# Patient Record
Sex: Male | Born: 1945 | Race: White | Hispanic: No | Marital: Single | Smoking: Former smoker
Health system: Southern US, Community
[De-identification: ages and names within clinical notes are randomized; demographics above are authoritative.]

## PROBLEM LIST (undated history)

## (undated) DIAGNOSIS — R7989 Other specified abnormal findings of blood chemistry: Principal | ICD-10-CM

## (undated) DIAGNOSIS — M1990 Unspecified osteoarthritis, unspecified site: Secondary | ICD-10-CM

## (undated) DIAGNOSIS — N400 Enlarged prostate without lower urinary tract symptoms: Secondary | ICD-10-CM

## (undated) DIAGNOSIS — F419 Anxiety disorder, unspecified: Secondary | ICD-10-CM

## (undated) DIAGNOSIS — I10 Essential (primary) hypertension: Secondary | ICD-10-CM

## (undated) DIAGNOSIS — E7800 Pure hypercholesterolemia, unspecified: Secondary | ICD-10-CM

## (undated) HISTORY — PX: COLONOSCOPY: SHX174

## (undated) HISTORY — PX: JOINT REPLACEMENT: SHX530

## (undated) HISTORY — PX: BACK SURGERY: SHX140

## (undated) HISTORY — DX: Other specified abnormal findings of blood chemistry: R79.89

---

## 2001-03-11 ENCOUNTER — Encounter: Payer: Self-pay | Admitting: Family Medicine

## 2001-03-11 ENCOUNTER — Ambulatory Visit (HOSPITAL_COMMUNITY): Admission: RE | Admit: 2001-03-11 | Discharge: 2001-03-11 | Payer: Self-pay | Admitting: Family Medicine

## 2005-09-05 ENCOUNTER — Ambulatory Visit (HOSPITAL_COMMUNITY): Admission: RE | Admit: 2005-09-05 | Discharge: 2005-09-05 | Payer: Self-pay | Admitting: Family Medicine

## 2006-10-01 ENCOUNTER — Ambulatory Visit (HOSPITAL_COMMUNITY): Admission: RE | Admit: 2006-10-01 | Discharge: 2006-10-01 | Payer: Self-pay | Admitting: Family Medicine

## 2007-03-19 ENCOUNTER — Encounter (HOSPITAL_COMMUNITY): Admission: RE | Admit: 2007-03-19 | Discharge: 2007-04-18 | Payer: Self-pay | Admitting: Orthopaedic Surgery

## 2007-04-19 ENCOUNTER — Encounter (HOSPITAL_COMMUNITY): Admission: RE | Admit: 2007-04-19 | Discharge: 2007-04-30 | Payer: Self-pay | Admitting: Orthopaedic Surgery

## 2007-05-02 ENCOUNTER — Encounter (HOSPITAL_COMMUNITY): Admission: RE | Admit: 2007-05-02 | Discharge: 2007-06-01 | Payer: Self-pay | Admitting: Orthopaedic Surgery

## 2007-09-18 ENCOUNTER — Ambulatory Visit (HOSPITAL_COMMUNITY): Admission: RE | Admit: 2007-09-18 | Discharge: 2007-09-18 | Payer: Self-pay | Admitting: General Surgery

## 2008-03-13 ENCOUNTER — Encounter: Admission: RE | Admit: 2008-03-13 | Discharge: 2008-03-13 | Payer: Self-pay | Admitting: Neurosurgery

## 2009-03-04 ENCOUNTER — Inpatient Hospital Stay (HOSPITAL_COMMUNITY): Admission: RE | Admit: 2009-03-04 | Discharge: 2009-03-07 | Payer: Self-pay | Admitting: Orthopaedic Surgery

## 2009-04-13 ENCOUNTER — Encounter (HOSPITAL_COMMUNITY): Admission: RE | Admit: 2009-04-13 | Discharge: 2009-04-29 | Payer: Self-pay | Admitting: Orthopaedic Surgery

## 2010-07-28 ENCOUNTER — Ambulatory Visit (HOSPITAL_COMMUNITY)
Admission: RE | Admit: 2010-07-28 | Discharge: 2010-07-28 | Payer: Self-pay | Source: Home / Self Care | Attending: Family Medicine | Admitting: Family Medicine

## 2010-11-05 LAB — URINE CULTURE: Colony Count: NO GROWTH

## 2010-11-05 LAB — BASIC METABOLIC PANEL
BUN: 10 mg/dL (ref 6–23)
BUN: 12 mg/dL (ref 6–23)
BUN: 14 mg/dL (ref 6–23)
CO2: 27 mEq/L (ref 19–32)
CO2: 29 mEq/L (ref 19–32)
CO2: 29 mEq/L (ref 19–32)
Calcium: 8.3 mg/dL — ABNORMAL LOW (ref 8.4–10.5)
Calcium: 8.4 mg/dL (ref 8.4–10.5)
Chloride: 101 mEq/L (ref 96–112)
Creatinine, Ser: 0.68 mg/dL (ref 0.4–1.5)
Creatinine, Ser: 0.79 mg/dL (ref 0.4–1.5)
GFR calc Af Amer: >60 mL/min (ref >60)
GFR calc non Af Amer: >60 mL/min (ref >60)
Glucose, Bld: 140 mg/dL — ABNORMAL HIGH (ref 70–99)
Glucose, Bld: 140 mg/dL — ABNORMAL HIGH (ref 70–99)
Potassium: 4.3 mEq/L (ref 3.5–5.1)

## 2010-11-05 LAB — PROTIME-INR
INR: 1.1 (ref 0.00–1.49)
INR: 1.2 (ref 0.00–1.49)
Prothrombin Time: 14.2 seconds (ref 11.6–15.2)
Prothrombin Time: 15 seconds (ref 11.6–15.2)

## 2010-11-05 LAB — CBC
HCT: 34.9 % — ABNORMAL LOW (ref 39.0–52.0)
MCHC: 34.7 g/dL (ref 30.0–36.0)
MCHC: 35.1 g/dL (ref 30.0–36.0)
MCHC: 35.1 g/dL (ref 30.0–36.0)
MCV: 94.5 fL (ref 78.0–100.0)
MCV: 94.8 fL (ref 78.0–100.0)
MCV: 95.1 fL (ref 78.0–100.0)
Platelets: 147 10*3/uL — ABNORMAL LOW (ref 150–400)
Platelets: 155 10*3/uL (ref 150–400)
RBC: 3.43 MIL/uL — ABNORMAL LOW (ref 4.22–5.81)
RDW: 13.8 % (ref 11.5–15.5)
RDW: 13.9 % (ref 11.5–15.5)
RDW: 14.2 % (ref 11.5–15.5)

## 2010-11-05 LAB — URINE MICROSCOPIC-ADD ON

## 2010-11-05 LAB — URINALYSIS, ROUTINE W REFLEX MICROSCOPIC
Bilirubin Urine: NEGATIVE
Ketones, ur: NEGATIVE mg/dL
Nitrite: NEGATIVE
Urobilinogen, UA: 1 mg/dL (ref 0.0–1.0)

## 2010-11-06 LAB — URINALYSIS, ROUTINE W REFLEX MICROSCOPIC
Bilirubin Urine: NEGATIVE
Hgb urine dipstick: NEGATIVE
Nitrite: NEGATIVE
Specific Gravity, Urine: 1.025 (ref 1.005–1.030)
Urobilinogen, UA: 0.2 mg/dL (ref 0.0–1.0)
pH: 5 (ref 5.0–8.0)

## 2010-11-06 LAB — COMPREHENSIVE METABOLIC PANEL
ALT: 44 U/L (ref 0–53)
AST: 36 U/L (ref 0–37)
Albumin: 4.2 g/dL (ref 3.5–5.2)
Alkaline Phosphatase: 82 U/L (ref 39–117)
Calcium: 9.8 mg/dL (ref 8.4–10.5)
GFR calc Af Amer: >60 mL/min (ref >60)
Potassium: 4.4 mEq/L (ref 3.5–5.1)
Sodium: 139 mEq/L (ref 135–145)
Total Protein: 6.6 g/dL (ref 6.0–8.3)

## 2010-11-06 LAB — TYPE AND SCREEN
ABO/RH(D): O POS
Antibody Screen: NEGATIVE

## 2010-11-06 LAB — DIFFERENTIAL
Basophils Relative: 1 % (ref 0–1)
Eosinophils Absolute: 0.1 10*3/uL (ref 0.0–0.7)
Lymphs Abs: 1.6 10*3/uL (ref 0.7–4.0)
Monocytes Absolute: 0.7 10*3/uL (ref 0.1–1.0)
Monocytes Relative: 12 % (ref 3–12)
Neutrophils Relative %: 58 % (ref 43–77)

## 2010-11-06 LAB — PROTIME-INR: INR: 1 (ref 0.00–1.49)

## 2010-11-06 LAB — CBC
Hemoglobin: 15.3 g/dL (ref 13.0–17.0)
MCHC: 35 g/dL (ref 30.0–36.0)
Platelets: 188 10*3/uL (ref 150–400)
RDW: 14.2 % (ref 11.5–15.5)

## 2010-12-13 NOTE — H&P (Signed)
NAME:  Jerry Boyer, Jerry Boyer NO.:  [id number]   MEDICAL RECORD NO.:  [id number]          PATIENT TYPE:  AMB   LOCATION:  DAY                           FACILITY:  [facility]   PHYSICIAN:  Dalia Heading, M.D.  DATE OF BIRTH:  07/20/1946   DATE OF ADMISSION:  DATE OF DISCHARGE:  LH                              HISTORY & PHYSICAL   CHIEF COMPLAINT:  Need for screening colonoscopy.   HISTORY OF PRESENT ILLNESS:  The patient is a 65 year old white male who  is referred for endoscopic evaluation.  He needs a colonoscopy for  screening purposes.  No abdominal pain, weight loss, nausea, vomiting,  diarrhea, constipation, melena, or hematochezia have been noted.  He  last had a colonoscopy many years ago in [place].  There is no family  history of colon carcinoma.   PAST MEDICAL HISTORY:  Chronic back pain.   PAST SURGICAL HISTORY:  Back surgery.   MEDICATIONS:  None.   ALLERGIES:  No known drug allergies.   REVIEW OF SYSTEMS:  Noncontributory.   PHYSICAL EXAMINATION:  GENERAL:  The patient is a well-developed, well-  nourished white male in no acute distress.  LUNGS:  Clear to auscultation, with equal breath sounds bilaterally.  HEART:  Reveals a regular rate and rhythm, without S3, S4, or murmurs.  ABDOMEN:  The abdomen is soft, nontender, nondistended.  No  hepatosplenomegaly or masses are noted.  RECTAL:  Deferred to the procedure.   IMPRESSION:  Need for screening colonoscopy.   PLAN:  The patient is scheduled for a colonoscopy on September 18, 2007.  The risks and benefits of the procedure, including bleeding and  perforation, were fully explained to the patient, who gave informed  consent.      Dalia Heading, M.D.  Electronically Signed     MAJ/MEDQ  D:  09/12/2007  T:  09/13/2007  Job:  [id number]   cc:   [facility]  Fax: [phone]   Kirk Ruths, M.D.  Fax: [phone]

## 2010-12-13 NOTE — Op Note (Signed)
NAME:  Jerry Boyer, TARAS NO.:  [id number]   MEDICAL RECORD NO.:  [id number]          PATIENT TYPE:  INP   LOCATION:  5016                         FACILITY:  [facility]   PHYSICIAN:  Claude Manges. Whitfield, M.D.DATE OF BIRTH:  Oct 26, 1945   DATE OF PROCEDURE:  03/04/2009  DATE OF DISCHARGE:                               OPERATIVE REPORT   PREOPERATIVE DIAGNOSIS:  End-stage osteoarthritis, left knee.   POSTOPERATIVE DIAGNOSIS:  End-stage osteoarthritis, left knee.   PROCEDURE:  Left total knee replacement.   SURGEON:  Claude Manges. Cleophas Dunker, MD   ASSISTANT:  Oris Drone. [name], PA-C   ANESTHESIA:  General with supplemental femoral nerve block.   COMPLICATIONS:  None.   COMPONENTS:  DePuy LCS large femoral component, a #5 keeled tibial tray,  10-mm polyethylene bridging bearing, and metal-backed 3-peg patella, all  was secured with polymethyl methacrylate.   DESCRIPTION OF PROCEDURE:  The patient was comfortable on the operating  table and under general orotracheal anesthesia, nursing staff inserted a  Foley catheter.  Urine was clear.  Tourniquet was then applied to the  left lower extremity, left lower extremity had been appropriately  identified, and marked as the operative extremity preoperatively.   The left leg was then prepped with Betadine scrub and then DuraPrep from  the tourniquet to the midfoot.  Sterile draping was performed.   With the extremity still elevated, it was Esmarch exsanguinated with a  proximal tourniquet at 350 mmHg.   A midline longitudinal incision was made, centered at the patella,  extending from superior part of the tibial tubercle.  Via sharp  dissection, incision was carried down to subcutaneous tissue.  First  layer of capsule was incised in the midline and medial parapatellar  incision was then made with the Bovie.  The joint was entered.  There  was a clear yellow joint effusion.  The patella was everted to 180  degrees and knee  flexed to 90 degrees.  There was moderate beefy red  synovitis.  A synovectomy was performed.  There were large osteophytes  along the medial and lateral femoral condyle.  There was complete  absence of articular cartilage in the patella and at least 80% along the  medial femoral condyle and probably 70% along the lateral femoral  condyle.  There were osteophytes along the medial tibial plateau, which  were resected.   I templated a large femoral component #5 tibial tray.   Initial cut was made transversely on the proximal tibia at a 7-degree  posterior angle of declination using the external guide.  Subsequent  cuts were then made on the femur using a 10-mm flexion/extension gap,  which were symmetrical.  MCL and LCL remained intact throughout the  operative procedure.  A distal femoral valgus cut of 4 degrees was  utilized.  The final taper cut was then made.   Lamina spreader was inserted in the medial and lateral compartments.  I  removed medial and lateral menisci as well as ACL and PCL and  osteophytes were removed with an osteotome from the posterior aspect of  medial and  lateral femoral condyle.   Retractor was then placed around the tibia.  We measured a #5 tibial  tray, and this was applied.  A center hole was then made followed by the  keeled cut.  With the trial tibial tray in place, a #10 polyethylene  bridging bearing was applied followed by the large femoral component.  We had excellent range of motion.  We had full extension and flexion to  at least 125 degrees without malrotation of the tibial tray.  There was  no opening with varus or valgus stress.   The patella was then prepared by removing approximately 12 mm of bone  leaving 13 mm of patella thickness, 3 holes were then made with the  patella jig.  The trial patella was applied through full range of  motion, was perfectly stable.   Trial components were removed.  The joint was copiously irrigated with  saline  solution.   The final components were then impacted with polymethyl methacrylate.  We initially applied the tibia followed by the polyethylene component  and the femoral component.  Extraneous methacrylate was removed from its  periphery.  The patella was applied with a patellar clamp.  In similar  fashion, methacrylate was removed from its periphery.  We waited for the  methacrylate to completely mature and when hard, we again inspected the  joint, there was no further loose methacrylate.  Joint was copiously  irrigated with saline solution.  We injected Marcaine with epinephrine  into the deep capsule.  Tourniquet was deflated at about 84 minutes.  Gross bleeders were Bovie coagulated.  We injected FloSeal into the  joint for hemostasis.  Hemovac was not necessary.  We lightly irrigated  the joint with saline solution and then closed the wound.   The deep capsule was closed with interrupted #1 Ethibond.  Superficial  capsule was closed with running 0 Vicryl, subcu with 2-0 Vicryl, skin  closed with skin clips.  Sterile bulky dressing was applied followed by  the patient's compressive stocking.   The patient tolerated the procedure without complications.      Claude Manges. Cleophas Dunker, M.D.  Electronically Signed     PWW/MEDQ  D:  03/04/2009  T:  03/04/2009  Job:  [id number]

## 2010-12-16 NOTE — Discharge Summary (Signed)
NAME:  Jerry Boyer, Jerry Boyer NO.:  [id number]   MEDICAL RECORD NO.:  [id number]          PATIENT TYPE:  INP   LOCATION:  5016                         FACILITY:  [facility]   PHYSICIAN:  Claude Manges. Whitfield, M.D.DATE OF BIRTH:  1945/11/11   DATE OF ADMISSION:  03/04/2009  DATE OF DISCHARGE:  03/07/2009                               DISCHARGE SUMMARY   ADMISSION DIAGNOSIS:  Osteoarthritis of the left knee.   DISCHARGE DIAGNOSES:  1. Osteoarthritis of the left knee.  2. History of hypertension.  3. History of cardiac arrhythmia.  4. Obesity.  5. Hyperosmolality.  6. Acute posthemorrhagic anemia.   PROCEDURE:  Left total knee arthroplasty by Dr. Norlene Campbell and  assisted by Jacqualine Code, PA-C.   HISTORY:  [name] is a very pleasant 65 year old white male with  persistent left knee pain for several years.  He now has constant  moderate-to-severe pain, which is affecting his activities of daily  living and sleep.  He has had corticosteroid and Hyalgan injections,  which have become refractory.  He has failed conservative treatment.  Radiographic end-stage OA of the left knee for left total knee  arthroplasty.   HOSPITAL COURSE:  A 65 year old male admitted March 04, 2009, and after  appropriate laboratory studies were obtained as well as 2 g of Ancef IV  on-call to the operating room, he was taken to the operating room where  he underwent a left total knee arthroplasty.  He tolerated the procedure  well.  He was continued on Ancef 1 g IV q.6 h. for three doses.  Placed  on Dilaudid PCA full-dose pain management system.  Placed on Lovenox 30  mg subcu q.12 h. starting on March 04, 2009, at 10 p.m.  CPM was placed  from 0-60 degrees for 8 hours per day, increasing by 5 degrees per day.  Consults of PT, OT, and care management were made.  Partial  weightbearing, 50% weightbearing.  He ws allowed out of bed to chair the  following day.  He was allowed to keep his Foley  until March 06, 2009.  A urine for C and S was taken prior to discontinuation of his Foley on  the March 06, 2009.  He was weaned off his PCA as well as his O2 keeping  his sats greater than 92%.  Remainder of his hospital course was  uneventful.  He was started on Lovenox 40 mg until his protime became  prophylactic after discharge.  He was discharged on March 07, 2009, to  return back to the office in followup.   EKG was read as sinus bradycardia with first-degree AV block.  Inferior  infarct age undetermined.  Cannot rule out anterior infarct.  Preop  hemoglobin 15.3, hematocrit 43.6%, white count 5800, platelets was  188,000.  Discharge hemoglobin 11.4, hematocrit 32.5%, white count 7500,  platelets was 147,000.  Preop protime 13.1, INR 1.0, PTT 28.  Discharge  protime 15.0, INR 1.2.  Preop sodium 139, potassium 4.4, chloride 107,  CO2 of 23, glucose 111, BUN 16, creatinine 0.76.  He did drop to 131  sodium during hospital course.  Discharge sodium 133, potassium 4.0,  chloride 99, CO2 of 29, glucose 115, BUN 10, creatinine 0.68.  GFR  remained greater than 60 throughout his hospital course.  Total protein  preop 6.6, albumin 4.2, AST 36, ALT 44, ALP 82, total bilirubin 0.6.  Urinalysis on February 26, 2009, was benign.  Urinalysis March 06, 2009,  showed 11-20 white cells, 21-50 red cells, mucus present, rare  epitheliums, and small amount of leukocyte esterase.  Blood type was O+,  antibody screen negative.  Urine culture of March 06, 2009, was no  growth.   DISCHARGE INSTRUCTIONS:  No restrictions on his diet.  Use his walker  50% weightbearing as taught.  CPM 0-70 degrees for 8 hours per day,  increasing by 5-10 degrees per day.  He may shower on Sunday or Monday.  No lifting or driving for 6 weeks.  Keep the incision clean and dry and  change dressing daily with 4x4s and tape.   DISCHARGE MEDICATIONS:  1. Percocet 5/325 one to two tablets every 4 hours as needed for pain.  2.  Robaxin 500 mg 1 tablet every 6 hours as needed for spasms.  3. Coumadin 5 mg as directed by Genevieve Norlander Pharmacist.  4. Lovenox 40 mg daily until INR is greater than 2.0.  5. Colace 100 mg b.i.d.  6. MiraLax as needed for constipation.  7. Coumadin 10 mg on March 07, 2009.   Genevieve Norlander will monitor his INRs starting on March 08, 2009.  He will  follow back up with Dr. Cleophas Dunker on March 19, 2009.  He will need to  call for an appointment at that time.  He was discharged in improved  condition.      [name], P.A.-C.      Claude Manges. Cleophas Dunker, M.D.  Electronically Signed    BDP/MEDQ  D:  03/22/2009  T:  03/23/2009  Job:  [id number]

## 2012-01-26 ENCOUNTER — Other Ambulatory Visit: Payer: Self-pay | Admitting: Orthopaedic Surgery

## 2012-01-26 DIAGNOSIS — M545 Low back pain: Secondary | ICD-10-CM

## 2012-02-03 ENCOUNTER — Other Ambulatory Visit: Payer: Self-pay

## 2012-02-05 ENCOUNTER — Other Ambulatory Visit: Payer: Self-pay | Admitting: Orthopaedic Surgery

## 2012-02-05 DIAGNOSIS — Z139 Encounter for screening, unspecified: Secondary | ICD-10-CM

## 2012-02-07 ENCOUNTER — Ambulatory Visit
Admission: RE | Admit: 2012-02-07 | Discharge: 2012-02-07 | Disposition: A | Discharge status: Home / Self Care | Payer: Medicare Other | Source: Ambulatory Visit | Attending: Orthopaedic Surgery | Admitting: Orthopaedic Surgery

## 2012-02-07 DIAGNOSIS — Z139 Encounter for screening, unspecified: Secondary | ICD-10-CM

## 2012-02-07 DIAGNOSIS — M545 Low back pain: Secondary | ICD-10-CM

## 2012-09-03 ENCOUNTER — Other Ambulatory Visit (HOSPITAL_COMMUNITY): Payer: Self-pay | Admitting: Family Medicine

## 2012-09-03 DIAGNOSIS — Z0000 Encounter for general adult medical examination without abnormal findings: Secondary | ICD-10-CM

## 2012-09-03 DIAGNOSIS — I10 Essential (primary) hypertension: Secondary | ICD-10-CM

## 2012-09-05 ENCOUNTER — Ambulatory Visit (HOSPITAL_COMMUNITY)
Admission: RE | Admit: 2012-09-05 | Discharge: 2012-09-05 | Disposition: A | Discharge status: Home / Self Care | Payer: Medicare Other | Source: Ambulatory Visit | Attending: Family Medicine | Admitting: Family Medicine

## 2012-09-05 DIAGNOSIS — Z0000 Encounter for general adult medical examination without abnormal findings: Secondary | ICD-10-CM | POA: Insufficient documentation

## 2012-09-05 DIAGNOSIS — I10 Essential (primary) hypertension: Secondary | ICD-10-CM | POA: Insufficient documentation

## 2013-10-17 ENCOUNTER — Ambulatory Visit (HOSPITAL_COMMUNITY)
Admission: RE | Admit: 2013-10-17 | Discharge: 2013-10-17 | Disposition: A | Discharge status: Home / Self Care | Payer: Medicare HMO | Source: Ambulatory Visit | Attending: Family Medicine | Admitting: Family Medicine

## 2013-10-17 ENCOUNTER — Other Ambulatory Visit (HOSPITAL_COMMUNITY): Payer: Self-pay | Admitting: Family Medicine

## 2013-10-17 DIAGNOSIS — R918 Other nonspecific abnormal finding of lung field: Secondary | ICD-10-CM | POA: Insufficient documentation

## 2013-10-17 DIAGNOSIS — Z0000 Encounter for general adult medical examination without abnormal findings: Secondary | ICD-10-CM

## 2013-10-17 DIAGNOSIS — Z87891 Personal history of nicotine dependence: Secondary | ICD-10-CM | POA: Insufficient documentation

## 2014-08-17 DIAGNOSIS — Z23 Encounter for immunization: Secondary | ICD-10-CM | POA: Diagnosis not present

## 2014-08-17 DIAGNOSIS — N400 Enlarged prostate without lower urinary tract symptoms: Secondary | ICD-10-CM | POA: Diagnosis not present

## 2014-08-17 DIAGNOSIS — M1611 Unilateral primary osteoarthritis, right hip: Secondary | ICD-10-CM | POA: Diagnosis not present

## 2014-08-17 DIAGNOSIS — M19011 Primary osteoarthritis, right shoulder: Secondary | ICD-10-CM | POA: Diagnosis not present

## 2014-08-17 DIAGNOSIS — Z6838 Body mass index (BMI) 38.0-38.9, adult: Secondary | ICD-10-CM | POA: Diagnosis not present

## 2014-08-28 DIAGNOSIS — M545 Low back pain: Secondary | ICD-10-CM | POA: Diagnosis not present

## 2014-08-28 DIAGNOSIS — M47817 Spondylosis without myelopathy or radiculopathy, lumbosacral region: Secondary | ICD-10-CM | POA: Diagnosis not present

## 2014-08-28 DIAGNOSIS — M5137 Other intervertebral disc degeneration, lumbosacral region: Secondary | ICD-10-CM | POA: Diagnosis not present

## 2014-08-28 DIAGNOSIS — M7551 Bursitis of right shoulder: Secondary | ICD-10-CM | POA: Diagnosis not present

## 2014-09-15 DIAGNOSIS — M5416 Radiculopathy, lumbar region: Secondary | ICD-10-CM | POA: Diagnosis not present

## 2014-09-15 DIAGNOSIS — M4306 Spondylolysis, lumbar region: Secondary | ICD-10-CM | POA: Diagnosis not present

## 2014-09-15 DIAGNOSIS — M4316 Spondylolisthesis, lumbar region: Secondary | ICD-10-CM | POA: Diagnosis not present

## 2014-10-08 DIAGNOSIS — M961 Postlaminectomy syndrome, not elsewhere classified: Secondary | ICD-10-CM | POA: Diagnosis not present

## 2014-10-08 DIAGNOSIS — M5416 Radiculopathy, lumbar region: Secondary | ICD-10-CM | POA: Diagnosis not present

## 2014-10-08 DIAGNOSIS — M4726 Other spondylosis with radiculopathy, lumbar region: Secondary | ICD-10-CM | POA: Diagnosis not present

## 2014-10-20 DIAGNOSIS — E669 Obesity, unspecified: Secondary | ICD-10-CM | POA: Diagnosis not present

## 2014-10-20 DIAGNOSIS — Z6838 Body mass index (BMI) 38.0-38.9, adult: Secondary | ICD-10-CM | POA: Diagnosis not present

## 2014-10-20 DIAGNOSIS — I10 Essential (primary) hypertension: Secondary | ICD-10-CM | POA: Diagnosis not present

## 2014-10-20 DIAGNOSIS — Z0001 Encounter for general adult medical examination with abnormal findings: Secondary | ICD-10-CM | POA: Diagnosis not present

## 2014-10-20 DIAGNOSIS — E782 Mixed hyperlipidemia: Secondary | ICD-10-CM | POA: Diagnosis not present

## 2014-10-22 DIAGNOSIS — E291 Testicular hypofunction: Secondary | ICD-10-CM | POA: Diagnosis not present

## 2014-10-28 ENCOUNTER — Other Ambulatory Visit: Payer: Self-pay | Admitting: Physical Medicine and Rehabilitation

## 2014-10-28 DIAGNOSIS — M545 Low back pain: Secondary | ICD-10-CM

## 2014-11-17 ENCOUNTER — Ambulatory Visit
Admission: RE | Admit: 2014-11-17 | Discharge: 2014-11-17 | Disposition: A | Discharge status: Home / Self Care | Payer: Commercial Managed Care - HMO | Source: Ambulatory Visit | Attending: Physical Medicine and Rehabilitation | Admitting: Physical Medicine and Rehabilitation

## 2014-11-17 DIAGNOSIS — M4316 Spondylolisthesis, lumbar region: Secondary | ICD-10-CM | POA: Diagnosis not present

## 2014-11-17 DIAGNOSIS — M545 Low back pain: Secondary | ICD-10-CM

## 2014-11-17 DIAGNOSIS — M47816 Spondylosis without myelopathy or radiculopathy, lumbar region: Secondary | ICD-10-CM | POA: Diagnosis not present

## 2014-11-17 DIAGNOSIS — M4306 Spondylolysis, lumbar region: Secondary | ICD-10-CM | POA: Diagnosis not present

## 2014-12-01 DIAGNOSIS — E291 Testicular hypofunction: Secondary | ICD-10-CM | POA: Diagnosis not present

## 2014-12-01 DIAGNOSIS — M961 Postlaminectomy syndrome, not elsewhere classified: Secondary | ICD-10-CM | POA: Diagnosis not present

## 2014-12-01 DIAGNOSIS — M4306 Spondylolysis, lumbar region: Secondary | ICD-10-CM | POA: Diagnosis not present

## 2014-12-01 DIAGNOSIS — M5416 Radiculopathy, lumbar region: Secondary | ICD-10-CM | POA: Diagnosis not present

## 2015-01-04 DIAGNOSIS — E291 Testicular hypofunction: Secondary | ICD-10-CM | POA: Diagnosis not present

## 2015-02-09 DIAGNOSIS — E291 Testicular hypofunction: Secondary | ICD-10-CM | POA: Diagnosis not present

## 2015-03-15 DIAGNOSIS — E291 Testicular hypofunction: Secondary | ICD-10-CM | POA: Diagnosis not present

## 2015-04-15 DIAGNOSIS — N400 Enlarged prostate without lower urinary tract symptoms: Secondary | ICD-10-CM | POA: Diagnosis not present

## 2015-04-20 DIAGNOSIS — N400 Enlarged prostate without lower urinary tract symptoms: Secondary | ICD-10-CM | POA: Diagnosis not present

## 2015-05-13 DIAGNOSIS — M1711 Unilateral primary osteoarthritis, right knee: Secondary | ICD-10-CM | POA: Diagnosis not present

## 2015-05-13 DIAGNOSIS — R262 Difficulty in walking, not elsewhere classified: Secondary | ICD-10-CM | POA: Diagnosis not present

## 2015-05-20 DIAGNOSIS — R262 Difficulty in walking, not elsewhere classified: Secondary | ICD-10-CM | POA: Diagnosis not present

## 2015-05-20 DIAGNOSIS — Z96652 Presence of left artificial knee joint: Secondary | ICD-10-CM | POA: Diagnosis not present

## 2015-05-20 DIAGNOSIS — M25561 Pain in right knee: Secondary | ICD-10-CM | POA: Diagnosis not present

## 2015-05-20 DIAGNOSIS — M1711 Unilateral primary osteoarthritis, right knee: Secondary | ICD-10-CM | POA: Diagnosis not present

## 2015-05-24 DIAGNOSIS — N400 Enlarged prostate without lower urinary tract symptoms: Secondary | ICD-10-CM | POA: Diagnosis not present

## 2015-05-26 DIAGNOSIS — M1711 Unilateral primary osteoarthritis, right knee: Secondary | ICD-10-CM | POA: Diagnosis not present

## 2015-05-26 DIAGNOSIS — M25561 Pain in right knee: Secondary | ICD-10-CM | POA: Diagnosis not present

## 2015-06-01 DIAGNOSIS — E782 Mixed hyperlipidemia: Secondary | ICD-10-CM | POA: Diagnosis not present

## 2015-06-01 DIAGNOSIS — Z6838 Body mass index (BMI) 38.0-38.9, adult: Secondary | ICD-10-CM | POA: Diagnosis not present

## 2015-06-01 DIAGNOSIS — Z1389 Encounter for screening for other disorder: Secondary | ICD-10-CM | POA: Diagnosis not present

## 2015-06-07 DIAGNOSIS — M25561 Pain in right knee: Secondary | ICD-10-CM | POA: Diagnosis not present

## 2015-06-07 DIAGNOSIS — M1711 Unilateral primary osteoarthritis, right knee: Secondary | ICD-10-CM | POA: Diagnosis not present

## 2015-06-16 DIAGNOSIS — M25561 Pain in right knee: Secondary | ICD-10-CM | POA: Diagnosis not present

## 2015-06-16 DIAGNOSIS — M1711 Unilateral primary osteoarthritis, right knee: Secondary | ICD-10-CM | POA: Diagnosis not present

## 2015-06-23 DIAGNOSIS — M25561 Pain in right knee: Secondary | ICD-10-CM | POA: Diagnosis not present

## 2015-06-23 DIAGNOSIS — M1711 Unilateral primary osteoarthritis, right knee: Secondary | ICD-10-CM | POA: Diagnosis not present

## 2015-09-22 DIAGNOSIS — M179 Osteoarthritis of knee, unspecified: Secondary | ICD-10-CM | POA: Diagnosis not present

## 2015-09-22 DIAGNOSIS — M1711 Unilateral primary osteoarthritis, right knee: Secondary | ICD-10-CM | POA: Diagnosis not present

## 2015-09-22 DIAGNOSIS — Z96652 Presence of left artificial knee joint: Secondary | ICD-10-CM | POA: Diagnosis not present

## 2015-10-21 DIAGNOSIS — N400 Enlarged prostate without lower urinary tract symptoms: Secondary | ICD-10-CM | POA: Diagnosis not present

## 2015-10-21 DIAGNOSIS — Z1389 Encounter for screening for other disorder: Secondary | ICD-10-CM | POA: Diagnosis not present

## 2015-10-21 DIAGNOSIS — Z0000 Encounter for general adult medical examination without abnormal findings: Secondary | ICD-10-CM | POA: Diagnosis not present

## 2015-10-21 DIAGNOSIS — Z6838 Body mass index (BMI) 38.0-38.9, adult: Secondary | ICD-10-CM | POA: Diagnosis not present

## 2015-11-15 DIAGNOSIS — M5416 Radiculopathy, lumbar region: Secondary | ICD-10-CM | POA: Diagnosis not present

## 2016-04-20 DIAGNOSIS — Z23 Encounter for immunization: Secondary | ICD-10-CM | POA: Diagnosis not present

## 2016-05-16 DIAGNOSIS — H52223 Regular astigmatism, bilateral: Secondary | ICD-10-CM | POA: Diagnosis not present

## 2016-05-16 DIAGNOSIS — H2513 Age-related nuclear cataract, bilateral: Secondary | ICD-10-CM | POA: Diagnosis not present

## 2016-05-16 DIAGNOSIS — H5203 Hypermetropia, bilateral: Secondary | ICD-10-CM | POA: Diagnosis not present

## 2016-10-26 DIAGNOSIS — Z0001 Encounter for general adult medical examination with abnormal findings: Secondary | ICD-10-CM | POA: Diagnosis not present

## 2016-10-26 DIAGNOSIS — Z1389 Encounter for screening for other disorder: Secondary | ICD-10-CM | POA: Diagnosis not present

## 2016-10-26 DIAGNOSIS — N401 Enlarged prostate with lower urinary tract symptoms: Secondary | ICD-10-CM | POA: Diagnosis not present

## 2016-10-26 DIAGNOSIS — I10 Essential (primary) hypertension: Secondary | ICD-10-CM | POA: Diagnosis not present

## 2016-10-26 DIAGNOSIS — E782 Mixed hyperlipidemia: Secondary | ICD-10-CM | POA: Diagnosis not present

## 2016-12-21 DIAGNOSIS — J209 Acute bronchitis, unspecified: Secondary | ICD-10-CM | POA: Diagnosis not present

## 2016-12-21 DIAGNOSIS — J069 Acute upper respiratory infection, unspecified: Secondary | ICD-10-CM | POA: Diagnosis not present

## 2017-01-23 DIAGNOSIS — H524 Presbyopia: Secondary | ICD-10-CM | POA: Diagnosis not present

## 2017-01-23 DIAGNOSIS — H2511 Age-related nuclear cataract, right eye: Secondary | ICD-10-CM | POA: Diagnosis not present

## 2017-01-23 DIAGNOSIS — H2513 Age-related nuclear cataract, bilateral: Secondary | ICD-10-CM | POA: Diagnosis not present

## 2017-01-24 NOTE — Patient Instructions (Signed)
Your procedure is scheduled on: 02/05/2017  Report to [facility] at   645    AM.  Call this number if you have problems the morning of surgery: [phone]   Do not eat food or drink liquids :After Midnight.      Take these medicines the morning of surgery with A SIP OF WATER: none   Do not wear jewelry, make-up or nail polish.  Do not wear lotions, powders, or perfumes. You may wear deodorant.  Do not shave 48 hours prior to surgery.  Do not bring valuables to the hospital.  Contacts, dentures or bridgework may not be worn into surgery.  Leave suitcase in the car. After surgery it may be brought to your room.  For patients admitted to the hospital, checkout time is 11:00 AM the day of discharge.   Patients discharged the day of surgery will not be allowed to drive home.  :     Please read over the following fact sheets that you were given: Coughing and Deep Breathing, Surgical Site Infection Prevention, Anesthesia Post-op Instructions and Care and Recovery After Surgery    Cataract A cataract is a clouding of the lens of the eye. When a lens becomes cloudy, vision is reduced based on the degree and nature of the clouding. Many cataracts reduce vision to some degree. Some cataracts make people more near-sighted as they develop. Other cataracts increase glare. Cataracts that are ignored and become worse can sometimes look white. The white color can be seen through the pupil. CAUSES   Aging. However, cataracts may occur at any age, even in newborns.   Certain drugs.   Trauma to the eye.   Certain diseases such as diabetes.   Specific eye diseases such as chronic inflammation inside the eye or a sudden attack of a rare form of glaucoma.   Inherited or acquired medical problems.  SYMPTOMS   Gradual, progressive drop in vision in the affected eye.   Severe, rapid visual loss. This most often happens when trauma is the cause.  DIAGNOSIS  To detect a cataract, an eye doctor examines  the lens. Cataracts are best diagnosed with an exam of the eyes with the pupils enlarged (dilated) by drops.  TREATMENT  For an early cataract, vision may improve by using different eyeglasses or stronger lighting. If that does not help your vision, surgery is the only effective treatment. A cataract needs to be surgically removed when vision loss interferes with your everyday activities, such as driving, reading, or watching TV. A cataract may also have to be removed if it prevents examination or treatment of another eye problem. Surgery removes the cloudy lens and usually replaces it with a substitute lens (intraocular lens, IOL).  At a time when both you and your doctor agree, the cataract will be surgically removed. If you have cataracts in both eyes, only one is usually removed at a time. This allows the operated eye to heal and be out of danger from any possible problems after surgery (such as infection or poor wound healing). In rare cases, a cataract may be doing damage to your eye. In these cases, your caregiver may advise surgical removal right away. The vast majority of people who have cataract surgery have better vision afterward. HOME CARE INSTRUCTIONS  If you are not planning surgery, you may be asked to do the following:  Use different eyeglasses.   Use stronger or brighter lighting.   Ask your eye doctor about reducing your  medicine dose or changing medicines if it is thought that a medicine caused your cataract. Changing medicines does not make the cataract go away on its own.   Become familiar with your surroundings. Poor vision can lead to injury. Avoid bumping into things on the affected side. You are at a higher risk for tripping or falling.   Exercise extreme care when driving or operating machinery.   Wear sunglasses if you are sensitive to bright light or experiencing problems with glare.  SEEK IMMEDIATE MEDICAL CARE IF:   You have a worsening or sudden vision loss.    You notice redness, swelling, or increasing pain in the eye.   You have a fever.  Document Released: 07/17/2005 Document Revised: 07/06/2011 Document Reviewed: 03/10/2011 [facility] Patient Information 2012 [place].PATIENT INSTRUCTIONS POST-ANESTHESIA  IMMEDIATELY FOLLOWING SURGERY:  Do not drive or operate machinery for the first twenty four hours after surgery.  Do not make any important decisions for twenty four hours after surgery or while taking narcotic pain medications or sedatives.  If you develop intractable nausea and vomiting or a severe headache please notify your doctor immediately.  FOLLOW-UP:  Please make an appointment with your surgeon as instructed. You do not need to follow up with anesthesia unless specifically instructed to do so.  WOUND CARE INSTRUCTIONS (if applicable):  Keep a dry clean dressing on the anesthesia/puncture wound site if there is drainage.  Once the wound has quit draining you may leave it open to air.  Generally you should leave the bandage intact for twenty four hours unless there is drainage.  If the epidural site drains for more than 36-48 hours please call the anesthesia department.  QUESTIONS?:  Please feel free to call your physician or the hospital operator if you have any questions, and they will be happy to assist you.

## 2017-01-26 ENCOUNTER — Ambulatory Visit: Payer: Medicare Other | Admitting: Urology

## 2017-01-26 ENCOUNTER — Ambulatory Visit: Payer: Commercial Managed Care - HMO | Admitting: Urology

## 2017-01-29 ENCOUNTER — Other Ambulatory Visit: Payer: Self-pay

## 2017-01-29 ENCOUNTER — Encounter (HOSPITAL_COMMUNITY): Payer: Self-pay

## 2017-01-29 ENCOUNTER — Encounter (HOSPITAL_COMMUNITY)
Admission: RE | Admit: 2017-01-29 | Discharge: 2017-01-29 | Disposition: A | Discharge status: Home / Self Care | Payer: Medicare Other | Source: Ambulatory Visit | Attending: Ophthalmology | Admitting: Ophthalmology

## 2017-01-29 DIAGNOSIS — Z01818 Encounter for other preprocedural examination: Secondary | ICD-10-CM | POA: Diagnosis not present

## 2017-01-29 DIAGNOSIS — Z01810 Encounter for preprocedural cardiovascular examination: Secondary | ICD-10-CM | POA: Insufficient documentation

## 2017-01-29 DIAGNOSIS — H2511 Age-related nuclear cataract, right eye: Secondary | ICD-10-CM | POA: Diagnosis not present

## 2017-01-29 DIAGNOSIS — R9431 Abnormal electrocardiogram [ECG] [EKG]: Secondary | ICD-10-CM | POA: Diagnosis not present

## 2017-01-29 HISTORY — DX: Benign prostatic hyperplasia without lower urinary tract symptoms: N40.0

## 2017-01-29 HISTORY — DX: Unspecified osteoarthritis, unspecified site: M19.90

## 2017-01-29 HISTORY — DX: Anxiety disorder, unspecified: F41.9

## 2017-01-29 LAB — BASIC METABOLIC PANEL
Anion gap: 9 (ref 5–15)
BUN: 20 mg/dL (ref 6–20)
CHLORIDE: 104 mmol/L (ref 101–111)
CO2: 23 mmol/L (ref 22–32)
CREATININE: 1.09 mg/dL (ref 0.61–1.24)
Calcium: 9.2 mg/dL (ref 8.9–10.3)
GFR calc Af Amer: >60 mL/min (ref >60)
GFR calc non Af Amer: >60 mL/min (ref >60)
Glucose, Bld: 106 mg/dL — ABNORMAL HIGH (ref 65–99)
Potassium: 4.3 mmol/L (ref 3.5–5.1)
Sodium: 136 mmol/L (ref 135–145)

## 2017-01-29 LAB — CBC WITH DIFFERENTIAL/PLATELET
Basophils Absolute: 0.1 10*3/uL (ref 0.0–0.1)
Basophils Relative: 1 %
Eosinophils Absolute: 0.2 10*3/uL (ref 0.0–0.7)
Eosinophils Relative: 2 %
HEMATOCRIT: 46.5 % (ref 39.0–52.0)
HEMOGLOBIN: 15.9 g/dL (ref 13.0–17.0)
Lymphocytes Relative: 26 %
Lymphs Abs: 1.9 10*3/uL (ref 0.7–4.0)
MCH: 32.1 pg (ref 26.0–34.0)
MCHC: 34.2 g/dL (ref 30.0–36.0)
MCV: 93.9 fL (ref 78.0–100.0)
MONOS PCT: 10 %
Monocytes Absolute: 0.7 10*3/uL (ref 0.1–1.0)
NEUTROS PCT: 61 %
Neutro Abs: 4.5 10*3/uL (ref 1.7–7.7)
Platelets: 187 10*3/uL (ref 150–400)
RBC: 4.95 MIL/uL (ref 4.22–5.81)
RDW: 14.5 % (ref 11.5–15.5)
WBC: 7.2 10*3/uL (ref 4.0–10.5)

## 2017-02-05 ENCOUNTER — Encounter (HOSPITAL_COMMUNITY)
Admission: RE | Disposition: A | Discharge status: Home / Self Care | Payer: Self-pay | Source: Ambulatory Visit | Attending: Ophthalmology

## 2017-02-05 ENCOUNTER — Ambulatory Visit (HOSPITAL_COMMUNITY)
Admission: RE | Admit: 2017-02-05 | Discharge: 2017-02-05 | Disposition: A | Discharge status: Home / Self Care | Payer: Medicare Other | Source: Ambulatory Visit | Attending: Ophthalmology | Admitting: Ophthalmology

## 2017-02-05 ENCOUNTER — Ambulatory Visit (HOSPITAL_COMMUNITY): Payer: Medicare Other | Admitting: Anesthesiology

## 2017-02-05 ENCOUNTER — Encounter (HOSPITAL_COMMUNITY): Payer: Self-pay | Admitting: *Deleted

## 2017-02-05 DIAGNOSIS — I252 Old myocardial infarction: Secondary | ICD-10-CM | POA: Diagnosis not present

## 2017-02-05 DIAGNOSIS — M1990 Unspecified osteoarthritis, unspecified site: Secondary | ICD-10-CM | POA: Diagnosis not present

## 2017-02-05 DIAGNOSIS — Z79899 Other long term (current) drug therapy: Secondary | ICD-10-CM | POA: Diagnosis not present

## 2017-02-05 DIAGNOSIS — Z87891 Personal history of nicotine dependence: Secondary | ICD-10-CM | POA: Insufficient documentation

## 2017-02-05 DIAGNOSIS — F419 Anxiety disorder, unspecified: Secondary | ICD-10-CM | POA: Diagnosis not present

## 2017-02-05 DIAGNOSIS — I10 Essential (primary) hypertension: Secondary | ICD-10-CM | POA: Insufficient documentation

## 2017-02-05 DIAGNOSIS — H2511 Age-related nuclear cataract, right eye: Secondary | ICD-10-CM | POA: Insufficient documentation

## 2017-02-05 HISTORY — PX: CATARACT EXTRACTION W/PHACO: SHX586

## 2017-02-05 SURGERY — PHACOEMULSIFICATION, CATARACT, WITH IOL INSERTION
Anesthesia: Monitor Anesthesia Care | Site: Eye | Laterality: Right

## 2017-02-05 MED ORDER — BSS IO SOLN
INTRAOCULAR | Status: DC | PRN
  Administered 2017-02-05: 15 mL

## 2017-02-05 MED ORDER — PHENYLEPHRINE HCL 2.5 % OP SOLN
1.0000 [drp] | OPHTHALMIC | Status: AC
Start: 2017-02-05 — End: 2017-02-05
  Administered 2017-02-05 (×3): 1 [drp] via OPHTHALMIC

## 2017-02-05 MED ORDER — CYCLOPENTOLATE-PHENYLEPHRINE 0.2-1 % OP SOLN
1.0000 [drp] | OPHTHALMIC | Status: AC
  Administered 2017-02-05 (×3): 1 [drp] via OPHTHALMIC

## 2017-02-05 MED ORDER — TETRACAINE HCL 0.5 % OP SOLN
1.0000 [drp] | OPHTHALMIC | Status: AC
  Administered 2017-02-05 (×3): 1 [drp] via OPHTHALMIC

## 2017-02-05 MED ORDER — POVIDONE-IODINE 5 % OP SOLN
OPHTHALMIC | Status: DC | PRN
  Administered 2017-02-05: 1 via OPHTHALMIC

## 2017-02-05 MED ORDER — LACTATED RINGERS IV SOLN
INTRAVENOUS | Status: DC
  Administered 2017-02-05: 08:00:00 via INTRAVENOUS

## 2017-02-05 MED ORDER — PROVISC 10 MG/ML IO SOLN
INTRAOCULAR | Status: DC | PRN
  Administered 2017-02-05: 0.85 mL via INTRAOCULAR

## 2017-02-05 MED ORDER — LIDOCAINE HCL (PF) 1 % IJ SOLN
INTRAMUSCULAR | Status: DC | PRN
  Administered 2017-02-05: .5 mL

## 2017-02-05 MED ORDER — FENTANYL CITRATE (PF) 100 MCG/2ML IJ SOLN
25.0000 ug | Freq: Once | INTRAMUSCULAR | Status: AC
  Administered 2017-02-05: 25 ug via INTRAVENOUS

## 2017-02-05 MED ORDER — MIDAZOLAM HCL 2 MG/2ML IJ SOLN
INTRAMUSCULAR | Status: AC
  Filled 2017-02-05: qty 2

## 2017-02-05 MED ORDER — NEOMYCIN-POLYMYXIN-DEXAMETH 3.5-10000-0.1 OP SUSP
OPHTHALMIC | Status: DC | PRN
  Administered 2017-02-05: 2 [drp] via OPHTHALMIC

## 2017-02-05 MED ORDER — MIDAZOLAM HCL 2 MG/2ML IJ SOLN
1.0000 mg | INTRAMUSCULAR | Status: AC
  Administered 2017-02-05: 2 mg via INTRAVENOUS

## 2017-02-05 MED ORDER — LIDOCAINE HCL 3.5 % OP GEL
1.0000 "application " | Freq: Once | OPHTHALMIC | Status: AC
  Administered 2017-02-05: 1 via OPHTHALMIC

## 2017-02-05 MED ORDER — EPINEPHRINE PF 1 MG/ML IJ SOLN
INTRAOCULAR | Status: DC | PRN
  Administered 2017-02-05: 500 mL

## 2017-02-05 MED ORDER — FENTANYL CITRATE (PF) 100 MCG/2ML IJ SOLN
INTRAMUSCULAR | Status: AC
  Filled 2017-02-05: qty 2

## 2017-02-05 SURGICAL SUPPLY — 10 items

## 2017-02-05 NOTE — Anesthesia Preprocedure Evaluation (Signed)
Anesthesia Evaluation  Patient identified by MRN, date of birth, ID band Patient awake    Reviewed: Allergy & Precautions, NPO status , Patient's Chart, lab work & pertinent test results  Airway Mallampati: II  TM Distance: >3 FB     Dental  (+) Partial Lower   Pulmonary neg pulmonary ROS, former smoker,    breath sounds clear to auscultation       Cardiovascular hypertension, Pt. on medications (-) angina+ Past MI   Rhythm:Regular Rate:Normal     Neuro/Psych PSYCHIATRIC DISORDERS Anxiety    GI/Hepatic negative GI ROS, (+)     substance abuse  alcohol use,   Endo/Other    Renal/GU      Musculoskeletal  (+) Arthritis ,   Abdominal   Peds  Hematology   Anesthesia Other Findings   Reproductive/Obstetrics                             Anesthesia Physical Anesthesia Plan  ASA: III  Anesthesia Plan: MAC   Post-op Pain Management:    Induction: Intravenous  PONV Risk Score and Plan:   Airway Management Planned: Nasal Cannula  Additional Equipment:   Intra-op Plan:   Post-operative Plan:   Informed Consent: I have reviewed the patients History and Physical, chart, labs and discussed the procedure including the risks, benefits and alternatives for the proposed anesthesia with the patient or authorized representative who has indicated his/her understanding and acceptance.     Plan Discussed with:   Anesthesia Plan Comments:         Anesthesia Quick Evaluation

## 2017-02-05 NOTE — Transfer of Care (Signed)
Immediate Anesthesia Transfer of Care Note  Patient: Jerry Boyer  Procedure(s) Performed: Procedure(s) (LRB): CATARACT EXTRACTION PHACO AND INTRAOCULAR LENS PLACEMENT (IOC) (Right)  Patient Location: Shortstay  Anesthesia Type: MAC  Level of Consciousness: awake  Airway & Oxygen Therapy: Patient Spontanous Breathing   Post-op Assessment: Report given to PACU RN, Post -op Vital signs reviewed and stable and Patient moving all extremities  Post vital signs: Reviewed and stable  Complications: No apparent anesthesia complications

## 2017-02-05 NOTE — H&P (Signed)
I have reviewed the H&P, the patient was re-examined, and I have identified no interval changes in medical condition and plan of care since the history and physical of record  

## 2017-02-05 NOTE — Op Note (Signed)
Date of Admission: 02/05/2017  Date of Surgery: 02/05/2017  Pre-Op Dx: Cataract Right  Eye  Post-Op Dx: Senile Nuclear Cataract  Right  Eye,  Dx Code H25.11  Surgeon: Tonny Branch, M.D.  Assistants: None  Anesthesia: Topical with MAC  Indications: Painless, progressive loss of vision with compromise of daily activities.  Surgery: Cataract Extraction with Intraocular lens Implant Right Eye  Discription: The patient had dilating drops and viscous lidocaine placed into the Right eye in the pre-op holding area. After transfer to the operating room, a time out was performed. The patient was then prepped and draped. Beginning with a 65m blade a paracentesis port was made at the surgeon's 2 o'clock position. The anterior chamber was then filled with 1% non-preserved lidocaine. This was followed by filling the anterior chamber with Provisc.  A 2.449mkeratome blade was used to make a clear corneal incision at the temporal limbus.  A bent cystatome needle was used to create a continuous tear capsulotomy. Hydrodissection was performed with balanced salt solution on a Fine canula. The lens nucleus was then removed using the phacoemulsification handpiece. Residual cortex was removed with the I&A handpiece. The anterior chamber and capsular bag were refilled with Provisc. A posterior chamber intraocular lens was placed into the capsular bag with it's injector. The implant was positioned with the Kuglan hook. The Provisc was then removed from the anterior chamber and capsular bag with the I&A handpiece. Stromal hydration of the main incision and paracentesis port was performed with BSS on a Fine canula. The wounds were tested for leak which was negative. The patient tolerated the procedure well. There were no operative complications. The patient was then transferred to the recovery room in stable condition.  Complications: None  Specimen: None  EBL: None  Prosthetic device: Abbott Technis, PCB00, power 21.5, SN  [id number]

## 2017-02-05 NOTE — Anesthesia Procedure Notes (Signed)
Procedure Name: MAC Date/Time: 02/05/2017 8:13 AM Performed by: Vista Deck Pre-anesthesia Checklist: Patient identified, Emergency Drugs available, Suction available, Timeout performed and Patient being monitored Patient Re-evaluated:Patient Re-evaluated prior to inductionOxygen Delivery Method: Nasal Cannula

## 2017-02-05 NOTE — Discharge Instructions (Signed)

## 2017-02-05 NOTE — Anesthesia Postprocedure Evaluation (Signed)
Anesthesia Post Note  Patient: Jerry Boyer  Procedure(s) Performed: Procedure(s) (LRB): CATARACT EXTRACTION PHACO AND INTRAOCULAR LENS PLACEMENT (IOC) (Right)  Patient location during evaluation: Short Stay Anesthesia Type: MAC Level of consciousness: awake and alert Pain management: pain level controlled Vital Signs Assessment: post-procedure vital signs reviewed and stable Respiratory status: spontaneous breathing Cardiovascular status: stable Anesthetic complications: no     Last Vitals:  Vitals:   02/05/17 0735 02/05/17 0831  BP: (!) 150/84 (!) 151/76  Pulse:  60  Resp: 18 16  Temp:  36.9 C    Last Pain:  Vitals:   02/05/17 0831  TempSrc: Oral                 Meili Kleckley

## 2017-02-06 ENCOUNTER — Encounter (HOSPITAL_COMMUNITY): Payer: Self-pay | Admitting: Ophthalmology

## 2017-02-12 DIAGNOSIS — H2512 Age-related nuclear cataract, left eye: Secondary | ICD-10-CM | POA: Diagnosis not present

## 2017-02-16 ENCOUNTER — Encounter (HOSPITAL_COMMUNITY)
Admission: RE | Admit: 2017-02-16 | Discharge: 2017-02-16 | Disposition: A | Discharge status: Home / Self Care | Payer: Medicare Other | Source: Ambulatory Visit | Attending: Ophthalmology | Admitting: Ophthalmology

## 2017-02-16 ENCOUNTER — Encounter (HOSPITAL_COMMUNITY): Payer: Self-pay

## 2017-02-19 ENCOUNTER — Encounter (HOSPITAL_COMMUNITY)
Admission: RE | Disposition: A | Discharge status: Home / Self Care | Payer: Self-pay | Source: Ambulatory Visit | Attending: Ophthalmology

## 2017-02-19 ENCOUNTER — Encounter (HOSPITAL_COMMUNITY): Payer: Self-pay | Admitting: Ophthalmology

## 2017-02-19 ENCOUNTER — Ambulatory Visit (HOSPITAL_COMMUNITY)
Admission: RE | Admit: 2017-02-19 | Discharge: 2017-02-19 | Disposition: A | Discharge status: Home / Self Care | Payer: Medicare Other | Source: Ambulatory Visit | Attending: Ophthalmology | Admitting: Ophthalmology

## 2017-02-19 ENCOUNTER — Ambulatory Visit (HOSPITAL_COMMUNITY): Payer: Medicare Other | Admitting: Anesthesiology

## 2017-02-19 DIAGNOSIS — H2512 Age-related nuclear cataract, left eye: Secondary | ICD-10-CM | POA: Diagnosis not present

## 2017-02-19 DIAGNOSIS — I252 Old myocardial infarction: Secondary | ICD-10-CM | POA: Insufficient documentation

## 2017-02-19 DIAGNOSIS — F419 Anxiety disorder, unspecified: Secondary | ICD-10-CM | POA: Insufficient documentation

## 2017-02-19 DIAGNOSIS — Z87891 Personal history of nicotine dependence: Secondary | ICD-10-CM | POA: Insufficient documentation

## 2017-02-19 DIAGNOSIS — M1990 Unspecified osteoarthritis, unspecified site: Secondary | ICD-10-CM | POA: Insufficient documentation

## 2017-02-19 DIAGNOSIS — I10 Essential (primary) hypertension: Secondary | ICD-10-CM | POA: Insufficient documentation

## 2017-02-19 HISTORY — PX: CATARACT EXTRACTION W/PHACO: SHX586

## 2017-02-19 SURGERY — PHACOEMULSIFICATION, CATARACT, WITH IOL INSERTION
Anesthesia: Monitor Anesthesia Care | Site: Eye | Laterality: Left

## 2017-02-19 MED ORDER — EPINEPHRINE PF 1 MG/ML IJ SOLN
INTRAOCULAR | Status: DC | PRN
  Administered 2017-02-19: 500 mL

## 2017-02-19 MED ORDER — BSS IO SOLN
INTRAOCULAR | Status: DC | PRN
Start: 2017-02-19 — End: 2017-02-19
  Administered 2017-02-19: 15 mL via INTRAOCULAR

## 2017-02-19 MED ORDER — LACTATED RINGERS IV SOLN
INTRAVENOUS | Status: DC
  Administered 2017-02-19: 09:00:00 via INTRAVENOUS

## 2017-02-19 MED ORDER — PHENYLEPHRINE HCL 2.5 % OP SOLN
1.0000 [drp] | OPHTHALMIC | Status: AC
  Administered 2017-02-19 (×3): 1 [drp] via OPHTHALMIC

## 2017-02-19 MED ORDER — LIDOCAINE 3.5 % OP GEL OPTIME - NO CHARGE
OPHTHALMIC | Status: DC | PRN
  Administered 2017-02-19: 1 [drp] via OPHTHALMIC

## 2017-02-19 MED ORDER — FENTANYL CITRATE (PF) 100 MCG/2ML IJ SOLN
INTRAMUSCULAR | Status: AC
  Filled 2017-02-19: qty 2

## 2017-02-19 MED ORDER — LIDOCAINE HCL (PF) 1 % IJ SOLN
INTRAOCULAR | Status: DC | PRN
  Administered 2017-02-19: .6 mL via OPHTHALMIC

## 2017-02-19 MED ORDER — NEOMYCIN-POLYMYXIN-DEXAMETH 3.5-10000-0.1 OP SUSP
OPHTHALMIC | Status: DC | PRN
  Administered 2017-02-19: 2 [drp] via OPHTHALMIC

## 2017-02-19 MED ORDER — TETRACAINE HCL 0.5 % OP SOLN
1.0000 [drp] | OPHTHALMIC | Status: AC
  Administered 2017-02-19 (×3): 1 [drp] via OPHTHALMIC

## 2017-02-19 MED ORDER — MIDAZOLAM HCL 2 MG/2ML IJ SOLN
1.0000 mg | INTRAMUSCULAR | Status: AC
  Administered 2017-02-19: 2 mg via INTRAVENOUS

## 2017-02-19 MED ORDER — CYCLOPENTOLATE-PHENYLEPHRINE 0.2-1 % OP SOLN
1.0000 [drp] | OPHTHALMIC | Status: AC
  Administered 2017-02-19 (×3): 1 [drp] via OPHTHALMIC

## 2017-02-19 MED ORDER — MIDAZOLAM HCL 2 MG/2ML IJ SOLN
INTRAMUSCULAR | Status: AC
  Filled 2017-02-19: qty 2

## 2017-02-19 MED ORDER — POVIDONE-IODINE 5 % OP SOLN
OPHTHALMIC | Status: DC | PRN
  Administered 2017-02-19: 1 via OPHTHALMIC

## 2017-02-19 MED ORDER — PROVISC 10 MG/ML IO SOLN
INTRAOCULAR | Status: DC | PRN
  Administered 2017-02-19: 0.85 mL via INTRAOCULAR

## 2017-02-19 MED ORDER — FENTANYL CITRATE (PF) 100 MCG/2ML IJ SOLN
25.0000 ug | Freq: Once | INTRAMUSCULAR | Status: AC
  Administered 2017-02-19: 25 ug via INTRAVENOUS

## 2017-02-19 MED ORDER — EPINEPHRINE PF 1 MG/ML IJ SOLN
INTRAMUSCULAR | Status: AC
  Filled 2017-02-19: qty 2

## 2017-02-19 MED ORDER — LIDOCAINE HCL 3.5 % OP GEL: 1.0000 "application " | Freq: Once | OPHTHALMIC | Status: DC

## 2017-02-19 SURGICAL SUPPLY — 12 items

## 2017-02-19 NOTE — Anesthesia Postprocedure Evaluation (Signed)
Anesthesia Post Note  Patient: Jerry Boyer  Procedure(s) Performed: Procedure(s) (LRB): CATARACT EXTRACTION PHACO AND INTRAOCULAR LENS PLACEMENT LEFT EYE (Left)  Patient location during evaluation: PACU Anesthesia Type: MAC Level of consciousness: awake and alert and oriented Pain management: pain level controlled Vital Signs Assessment: post-procedure vital signs reviewed and stable Respiratory status: spontaneous breathing Cardiovascular status: blood pressure returned to baseline Postop Assessment: no signs of nausea or vomiting Anesthetic complications: no     Last Vitals:  Vitals:   02/19/17 0900 02/19/17 0915  BP: 137/75 140/76  Resp: 13 16    Last Pain: There were no vitals filed for this visit.               Jaeleah Smyser

## 2017-02-19 NOTE — H&P (Signed)
I have reviewed the H&P, the patient was re-examined, and I have identified no interval changes in medical condition and plan of care since the history and physical of record  

## 2017-02-19 NOTE — Transfer of Care (Signed)
Immediate Anesthesia Transfer of Care Note  Patient: Jerry Boyer  Procedure(s) Performed: Procedure(s) with comments: CATARACT EXTRACTION PHACO AND INTRAOCULAR LENS PLACEMENT LEFT EYE (Left) - CDE: 7.98  Patient Location: PACU  Anesthesia Type:MAC  Level of Consciousness: awake  Airway & Oxygen Therapy: Patient Spontanous Breathing  Post-op Assessment: Report given to RN  Post vital signs: Reviewed  Last Vitals:  Vitals:   02/19/17 0900 02/19/17 0915  BP: 137/75 140/76  Resp: 13 16    Last Pain: There were no vitals filed for this visit.       Complications: No apparent anesthesia complications

## 2017-02-19 NOTE — Op Note (Signed)
Date of Admission: 02/19/2017  Date of Surgery: 02/19/2017  Pre-Op Dx: Cataract Left  Eye  Post-Op Dx: Senile Nuclear Cataract  Left  Eye,  Dx Code H25.12  Surgeon: Tonny Branch, M.D.  Assistants: None  Anesthesia: Topical with MAC  Indications: Painless, progressive loss of vision with compromise of daily activities.  Surgery: Cataract Extraction with Intraocular lens Implant Left Eye  Discription: The patient had dilating drops and viscous lidocaine placed into the Left eye in the pre-op holding area. After transfer to the operating room, a time out was performed. The patient was then prepped and draped. Beginning with a 66m blade a paracentesis port was made at the surgeon's 2 o'clock position. The anterior chamber was then filled with 1% non-preserved lidocaine. This was followed by filling the anterior chamber with Provisc.  A 2.435mkeratome blade was used to make a clear corneal incision at the temporal limbus.  A bent cystatome needle was used to create a continuous tear capsulotomy. Hydrodissection was performed with balanced salt solution on a Fine canula. The lens nucleus was then removed using the phacoemulsification handpiece. Residual cortex was removed with the I&A handpiece. The anterior chamber and capsular bag were refilled with Provisc. A posterior chamber intraocular lens was placed into the capsular bag with it's injector. The implant was positioned with the Kuglan hook. The Provisc was then removed from the anterior chamber and capsular bag with the I&A handpiece. Stromal hydration of the main incision and paracentesis port was performed with BSS on a Fine canula. The wounds were tested for leak which was negative. The patient tolerated the procedure well. There were no operative complications. The patient was then transferred to the recovery room in stable condition.  Complications: None  Specimen: None  EBL: None  Prosthetic device: Abbott Technis, PCB00, power 21.5, SN  [id number]

## 2017-02-19 NOTE — Anesthesia Preprocedure Evaluation (Signed)
Anesthesia Evaluation  Patient identified by MRN, date of birth, ID band Patient awake    Reviewed: Allergy & Precautions, NPO status , Patient's Chart, lab work & pertinent test results  Airway Mallampati: II  TM Distance: >3 FB     Dental  (+) Partial Lower   Pulmonary neg pulmonary ROS, former smoker,    breath sounds clear to auscultation       Cardiovascular hypertension, Pt. on medications (-) angina+ Past MI   Rhythm:Regular Rate:Normal     Neuro/Psych PSYCHIATRIC DISORDERS Anxiety    GI/Hepatic negative GI ROS, (+)     substance abuse  alcohol use,   Endo/Other    Renal/GU      Musculoskeletal  (+) Arthritis ,   Abdominal   Peds  Hematology   Anesthesia Other Findings   Reproductive/Obstetrics                             Anesthesia Physical Anesthesia Plan  ASA: III  Anesthesia Plan: MAC   Post-op Pain Management:    Induction: Intravenous  PONV Risk Score and Plan:   Airway Management Planned: Nasal Cannula  Additional Equipment:   Intra-op Plan:   Post-operative Plan:   Informed Consent: I have reviewed the patients History and Physical, chart, labs and discussed the procedure including the risks, benefits and alternatives for the proposed anesthesia with the patient or authorized representative who has indicated his/her understanding and acceptance.     Plan Discussed with:   Anesthesia Plan Comments:         Anesthesia Quick Evaluation  

## 2017-02-20 ENCOUNTER — Encounter (HOSPITAL_COMMUNITY): Payer: Self-pay | Admitting: Ophthalmology

## 2017-04-27 DIAGNOSIS — Z23 Encounter for immunization: Secondary | ICD-10-CM | POA: Diagnosis not present

## 2017-11-14 DIAGNOSIS — Z6839 Body mass index (BMI) 39.0-39.9, adult: Secondary | ICD-10-CM | POA: Diagnosis not present

## 2017-11-14 DIAGNOSIS — N401 Enlarged prostate with lower urinary tract symptoms: Secondary | ICD-10-CM | POA: Diagnosis not present

## 2017-11-14 DIAGNOSIS — Z0001 Encounter for general adult medical examination with abnormal findings: Secondary | ICD-10-CM | POA: Diagnosis not present

## 2017-11-14 DIAGNOSIS — E782 Mixed hyperlipidemia: Secondary | ICD-10-CM | POA: Diagnosis not present

## 2017-11-14 DIAGNOSIS — Z1389 Encounter for screening for other disorder: Secondary | ICD-10-CM | POA: Diagnosis not present

## 2017-11-16 DIAGNOSIS — Z1389 Encounter for screening for other disorder: Secondary | ICD-10-CM | POA: Diagnosis not present

## 2017-11-16 DIAGNOSIS — Z6839 Body mass index (BMI) 39.0-39.9, adult: Secondary | ICD-10-CM | POA: Diagnosis not present

## 2017-11-16 DIAGNOSIS — Z0001 Encounter for general adult medical examination with abnormal findings: Secondary | ICD-10-CM | POA: Diagnosis not present

## 2017-11-16 DIAGNOSIS — H5200 Hypermetropia, unspecified eye: Secondary | ICD-10-CM | POA: Diagnosis not present

## 2017-11-26 DIAGNOSIS — M720 Palmar fascial fibromatosis [Dupuytren]: Secondary | ICD-10-CM | POA: Diagnosis not present

## 2017-11-26 DIAGNOSIS — M180 Bilateral primary osteoarthritis of first carpometacarpal joints: Secondary | ICD-10-CM | POA: Diagnosis not present

## 2017-11-26 DIAGNOSIS — M19041 Primary osteoarthritis, right hand: Secondary | ICD-10-CM | POA: Diagnosis not present

## 2017-12-06 ENCOUNTER — Ambulatory Visit: Payer: Medicare HMO | Admitting: General Surgery

## 2017-12-06 ENCOUNTER — Encounter: Payer: Self-pay | Admitting: General Surgery

## 2017-12-06 VITALS — BP 128/60 | HR 67 | Temp 98.2°F | Resp 22 | Wt 284.0 lb

## 2017-12-06 DIAGNOSIS — K4090 Unilateral inguinal hernia, without obstruction or gangrene, not specified as recurrent: Secondary | ICD-10-CM | POA: Diagnosis not present

## 2017-12-06 DIAGNOSIS — K429 Umbilical hernia without obstruction or gangrene: Secondary | ICD-10-CM | POA: Diagnosis not present

## 2017-12-06 NOTE — Progress Notes (Signed)
Jerry Boyer; [id number]; April 24, 1946   HPI Patient is a 72yo wm who presents with need for screening TCS, umbilical hernia, and a right inguinal hernia.  Referred by Dr. Gerarda Fraction.  Patient last had a screening colonoscopy in 2009.  He denies any family history of colon cancer, blood per rectum, abdominal pain, abnormal diarrhea or constipation.  He denies any recent weight loss.  He has right groin pain which started several months ago.  Is made worse with movement.  He does notice a lump in the right groin region.  It resolves with lying down.  He also has an umbilical hernia that is made worse with straining.  It is tender to touch when it is sticking out. Past Medical History:  Diagnosis Date  . Anxiety   . Arthritis   . BPH (benign prostatic hyperplasia)   . Myocardial infarction [facility])     Past Surgical History:  Procedure Laterality Date  . BACK SURGERY     lumbar disc  . CATARACT EXTRACTION W/PHACO Right 02/05/2017   Procedure: CATARACT EXTRACTION PHACO AND INTRAOCULAR LENS PLACEMENT (IOC);  Surgeon: Tonny Branch, MD;  Location: [facility];  Service: Ophthalmology;  Laterality: Right;  CDE: 7.36  . CATARACT EXTRACTION W/PHACO Left 02/19/2017   Procedure: CATARACT EXTRACTION PHACO AND INTRAOCULAR LENS PLACEMENT LEFT EYE;  Surgeon: Tonny Branch, MD;  Location: [facility];  Service: Ophthalmology;  Laterality: Left;  CDE: 7.98  . JOINT REPLACEMENT Left     History reviewed. No pertinent family history.  Current Outpatient Medications on File Prior to Visit  Medication Sig Dispense Refill  . aspirin EC 81 MG tablet Take 81 mg by mouth daily.    Marland Kitchen atorvastatin (LIPITOR) 40 MG tablet Take 40 mg by mouth daily.    Marland Kitchen b complex vitamins tablet Take 1 tablet by mouth daily.    . Bilberry 150 MG CAPS Take 150 mg by mouth daily.    . citalopram (CELEXA) 40 MG tablet Take 40 mg by mouth daily.    . Coenzyme Q10 (COQ10) 100 MG CAPS Take 100 mg by mouth daily.    Marland Kitchen dutasteride (AVODART) 0.5 MG capsule Take  0.5 mg by mouth every evening.    Marland Kitchen lisinopril (PRINIVIL,ZESTRIL) 20 MG tablet Take 20 mg by mouth daily.    . meloxicam (MOBIC) 7.5 MG tablet Take 7.5 mg by mouth 2 (two) times daily.    . Multiple Vitamins-Minerals (CENTRUM SILVER ADULT 50+ PO) Take 1 tablet by mouth daily.    . Omega-3 Fatty Acids (FISH OIL) 1200 MG CAPS Take 2,400 mg by mouth daily.    . vitamin C (ASCORBIC ACID) 500 MG tablet Take 500 mg by mouth daily.     No current facility-administered medications on file prior to visit.     No Known Allergies  Social History   Substance and Sexual Activity  Alcohol Use Yes  . Alcohol/week: 12.6 oz  . Types: 21 Cans of beer per week    Social History   Tobacco Use  Smoking Status Former Smoker  . Packs/day: 1.00  . Years: 20.00  . Pack years: 20.00  . Types: Cigarettes  . Last attempt to quit: 01/29/2001  . Years since quitting: 16.8  Smokeless Tobacco Never Used    Review of Systems  Constitutional: Negative.   HENT: Positive for ear pain.   Eyes: Negative.   Respiratory: Negative.   Cardiovascular: Negative.   Gastrointestinal: Negative.   Genitourinary: Positive for frequency and urgency.  Musculoskeletal:  Positive for back pain and joint pain.  Skin: Positive for rash.  Neurological: Negative.   Endo/Heme/Allergies: Negative.   Psychiatric/Behavioral: Negative.     Objective   Vitals:   12/06/17 1105  BP: 128/60  Pulse: 67  Resp: (!) 22  Temp: 98.2 F (36.8 C)    Physical Exam  Constitutional: He is oriented to person, place, and time. He appears well-developed and well-nourished.  HENT:  Head: Normocephalic and atraumatic.  Cardiovascular: Normal rate, regular rhythm and normal heart sounds. Exam reveals no friction rub.  No murmur heard. Pulmonary/Chest: Effort normal and breath sounds normal. No stridor. No respiratory distress. He has no wheezes. He has no rales.  Abdominal: Soft. Bowel sounds are normal. He exhibits no distension and  no mass. There is no tenderness. There is no guarding. A hernia is present.  Reducible right inguinal hernia.  Reducible umbilical hernia.  Neurological: He is alert and oriented to person, place, and time.  Skin: Skin is warm and dry.  Vitals reviewed.   Assessment  Right inguinal hernia, symptomatic Umbilical hernia, symptomatic Need for screening colonoscopy Plan   Patient would like to get his hernias fixed prior to getting his colonoscopy.  I agree with this.  Patient is scheduled for a right inguinal herniorrhaphy with mesh, umbilical herniorrhaphy with mesh on 12/14/2017.  The risks and benefits of the procedures including bleeding, infection, mesh use, and the possible recurrence of the hernias were fully explained to the patient, who gave informed consent.

## 2017-12-06 NOTE — H&P (Signed)
Jerry Boyer; [id number]; 1946/05/18   HPI Patient is a 72yo wm who presents with need for screening TCS, umbilical hernia, and a right inguinal hernia.  Referred by Dr. Gerarda Fraction.  Patient last had a screening colonoscopy in 2009.  He denies any family history of colon cancer, blood per rectum, abdominal pain, abnormal diarrhea or constipation.  He denies any recent weight loss.  He has right groin pain which started several months ago.  Is made worse with movement.  He does notice a lump in the right groin region.  It resolves with lying down.  He also has an umbilical hernia that is made worse with straining.  It is tender to touch when it is sticking out. Past Medical History:  Diagnosis Date  . Anxiety   . Arthritis   . BPH (benign prostatic hyperplasia)   . Myocardial infarction [facility])     Past Surgical History:  Procedure Laterality Date  . BACK SURGERY     lumbar disc  . CATARACT EXTRACTION W/PHACO Right 02/05/2017   Procedure: CATARACT EXTRACTION PHACO AND INTRAOCULAR LENS PLACEMENT (IOC);  Surgeon: Tonny Branch, MD;  Location: [facility];  Service: Ophthalmology;  Laterality: Right;  CDE: 7.36  . CATARACT EXTRACTION W/PHACO Left 02/19/2017   Procedure: CATARACT EXTRACTION PHACO AND INTRAOCULAR LENS PLACEMENT LEFT EYE;  Surgeon: Tonny Branch, MD;  Location: [facility];  Service: Ophthalmology;  Laterality: Left;  CDE: 7.98  . JOINT REPLACEMENT Left     History reviewed. No pertinent family history.  Current Outpatient Medications on File Prior to Visit  Medication Sig Dispense Refill  . aspirin EC 81 MG tablet Take 81 mg by mouth daily.    Marland Kitchen atorvastatin (LIPITOR) 40 MG tablet Take 40 mg by mouth daily.    Marland Kitchen b complex vitamins tablet Take 1 tablet by mouth daily.    . Bilberry 150 MG CAPS Take 150 mg by mouth daily.    . citalopram (CELEXA) 40 MG tablet Take 40 mg by mouth daily.    . Coenzyme Q10 (COQ10) 100 MG CAPS Take 100 mg by mouth daily.    Marland Kitchen dutasteride (AVODART) 0.5 MG capsule Take  0.5 mg by mouth every evening.    Marland Kitchen lisinopril (PRINIVIL,ZESTRIL) 20 MG tablet Take 20 mg by mouth daily.    . meloxicam (MOBIC) 7.5 MG tablet Take 7.5 mg by mouth 2 (two) times daily.    . Multiple Vitamins-Minerals (CENTRUM SILVER ADULT 50+ PO) Take 1 tablet by mouth daily.    . Omega-3 Fatty Acids (FISH OIL) 1200 MG CAPS Take 2,400 mg by mouth daily.    . vitamin C (ASCORBIC ACID) 500 MG tablet Take 500 mg by mouth daily.     No current facility-administered medications on file prior to visit.     No Known Allergies  Social History   Substance and Sexual Activity  Alcohol Use Yes  . Alcohol/week: 12.6 oz  . Types: 21 Cans of beer per week    Social History   Tobacco Use  Smoking Status Former Smoker  . Packs/day: 1.00  . Years: 20.00  . Pack years: 20.00  . Types: Cigarettes  . Last attempt to quit: 01/29/2001  . Years since quitting: 16.8  Smokeless Tobacco Never Used    Review of Systems  Constitutional: Negative.   HENT: Positive for ear pain.   Eyes: Negative.   Respiratory: Negative.   Cardiovascular: Negative.   Gastrointestinal: Negative.   Genitourinary: Positive for frequency and urgency.  Musculoskeletal:  Positive for back pain and joint pain.  Skin: Positive for rash.  Neurological: Negative.   Endo/Heme/Allergies: Negative.   Psychiatric/Behavioral: Negative.     Objective   Vitals:   12/06/17 1105  BP: 128/60  Pulse: 67  Resp: (!) 22  Temp: 98.2 F (36.8 C)    Physical Exam  Constitutional: He is oriented to person, place, and time. He appears well-developed and well-nourished.  HENT:  Head: Normocephalic and atraumatic.  Cardiovascular: Normal rate, regular rhythm and normal heart sounds. Exam reveals no friction rub.  No murmur heard. Pulmonary/Chest: Effort normal and breath sounds normal. No stridor. No respiratory distress. He has no wheezes. He has no rales.  Abdominal: Soft. Bowel sounds are normal. He exhibits no distension and  no mass. There is no tenderness. There is no guarding. A hernia is present.  Reducible right inguinal hernia.  Reducible umbilical hernia.  Neurological: He is alert and oriented to person, place, and time.  Skin: Skin is warm and dry.  Vitals reviewed.   Assessment  Right inguinal hernia, symptomatic Umbilical hernia, symptomatic Need for screening colonoscopy Plan   Patient would like to get his hernias fixed prior to getting his colonoscopy.  I agree with this.  Patient is scheduled for a right inguinal herniorrhaphy with mesh, umbilical herniorrhaphy with mesh on 12/14/2017.  The risks and benefits of the procedures including bleeding, infection, mesh use, and the possible recurrence of the hernias were fully explained to the patient, who gave informed consent.

## 2017-12-06 NOTE — Patient Instructions (Signed)

## 2017-12-11 NOTE — Patient Instructions (Signed)
Jerry Boyer  12/11/2017     @PREFPERIOPPHARMACY @   Your procedure is scheduled on 12/14/17.  Report to [facility] at 6:15 A.M.  Call this number if you have problems the morning of surgery:  [phone]   Remember:  Do not eat food or drink liquids after midnight.  Take these medicines the morning of surgery with A SIP OF WATER : Celexa, Zetia and Lisinopril   Do not wear jewelry, make-up or nail polish.  Do not wear lotions, powders, or perfumes, or deodorant.  Do not shave 48 hours prior to surgery.  Men may shave face and neck.  Do not bring valuables to the hospital.  [facility] is not responsible for any belongings or valuables.  Contacts, dentures or bridgework may not be worn into surgery.  Leave your suitcase in the car.  After surgery it may be brought to your room.  For patients admitted to the hospital, discharge time will be determined by your treatment team.  Patients discharged the day of surgery will not be allowed to drive home.   Name and phone number of your driver:   family Special instructions:  n/a   Please read over the following fact sheets that you were given. PATIENT INSTRUCTIONS POST-ANESTHESIA  IMMEDIATELY FOLLOWING SURGERY:  Do not drive or operate machinery for the first twenty four hours after surgery.  Do not make any important decisions for twenty four hours after surgery or while taking narcotic pain medications or sedatives.  If you develop intractable nausea and vomiting or a severe headache please notify your doctor immediately.  FOLLOW-UP:  Please make an appointment with your surgeon as instructed. You do not need to follow up with anesthesia unless specifically instructed to do so.  WOUND CARE INSTRUCTIONS (if applicable):  Keep a dry clean dressing on the anesthesia/puncture wound site if there is drainage.  Once the wound has quit draining you may leave it open to air.  Generally you should leave the bandage intact for twenty four  hours unless there is drainage.  If the epidural site drains for more than 36-48 hours please call the anesthesia department.  QUESTIONS?:  Please feel free to call your physician or the hospital operator if you have any questions, and they will be happy to assist you.       Care and Recovery After Surgery  Inguinal Hernia, Adult An inguinal hernia is when fat or the intestines push through the area where the leg meets the lower abdomen (groin) and create a rounded lump (bulge). This condition develops over time. There are three types of inguinal hernias. These types include:  Hernias that can be pushed back into the belly (are reducible).  Hernias that are not reducible (are incarcerated).  Hernias that are not reducible and lose their blood supply (are strangulated). This type of hernia requires emergency surgery.  What are the causes? This condition is caused by having a weak spot in the muscles or tissue. This weakness lets the hernia poke through. This condition can be triggered by:  Suddenly straining the muscles of the lower abdomen.  Lifting heavy objects.  Straining to have a bowel movement. Difficult bowel movements (constipation) can lead to this.  Coughing.  What increases the risk? This condition is more likely to develop in:  Men.  Pregnant women.  People who: ? Are overweight. ? Work in jobs that require long periods of standing or heavy lifting. ? Have had an inguinal hernia before. ?  Smoke or have lung disease. These factors can lead to long-lasting (chronic) coughing.  What are the signs or symptoms? Symptoms can depend on the size of the hernia. Often, a small inguinal hernia has no symptoms. Symptoms of a larger hernia include:  A lump in the groin. This is easier to see when the person is standing. It might not be visible when he or she is lying down.  Pain or burning in the groin. This occurs especially when lifting, straining, or coughing.  A dull  ache or a feeling of pressure in the groin.  A lump in the scrotum in men.  Symptoms of a strangulated inguinal hernia can include:  A bulge in the groin that is very painful and tender to the touch.  A bulge that turns red or purple.  Fever, nausea, and vomiting.  The inability to have a bowel movement or to pass gas.  How is this diagnosed? This condition is diagnosed with a medical history and physical exam. Your health care provider may feel your groin area and ask you to cough. How is this treated? Treatment for this condition varies depending on the size of your hernia and whether you have symptoms. If you do not have symptoms, your health care provider may have you watch your hernia carefully and come in for follow-up visits. If your hernia is larger or if you have symptoms, your treatment will include surgery. Follow these instructions at home: Lifestyle  Drink enough fluid to keep your urine clear or pale yellow.  Eat a diet that includes a lot of fiber. Eat plenty of fruits, vegetables, and whole grains. Talk with your health care provider if you have questions.  Avoid lifting heavy objects.  Avoid standing for long periods of time.  Do not use tobacco products, including cigarettes, chewing tobacco, or e-cigarettes. If you need help quitting, ask your health care provider.  Maintain a healthy weight. General instructions  Do not try to force the hernia back in.  Watch your hernia for any changes in color or size. Let your health care provider know if any changes occur.  Take over-the-counter and prescription medicines only as told by your health care provider.  Keep all follow-up visits as told by your health care provider. This is important. Contact a health care provider if:  You have a fever.  You have new symptoms.  Your symptoms get worse. Get help right away if:  You have pain in the groin that suddenly gets worse.  A bulge in the groin gets bigger  suddenly and does not go down.  You are a man and you have a sudden pain in the scrotum, or the size of your scrotum suddenly changes.  A bulge in the groin area becomes red or purple and is painful to the touch.  You have nausea or vomiting that does not go away.  You feel your heart beating a lot more quickly than normal.  You cannot have a bowel movement or pass gas. This information is not intended to replace advice given to you by your health care provider. Make sure you discuss any questions you have with your health care provider. Document Released: 12/03/2008 Document Revised: 12/23/2015 Document Reviewed: 05/27/2014 Elsevier Interactive Patient Education  2018 [employer].

## 2017-12-13 ENCOUNTER — Encounter (HOSPITAL_COMMUNITY)
Admission: RE | Admit: 2017-12-13 | Discharge: 2017-12-13 | Disposition: A | Discharge status: Home / Self Care | Payer: Medicare HMO | Source: Ambulatory Visit | Attending: General Surgery | Admitting: General Surgery

## 2017-12-13 ENCOUNTER — Encounter (HOSPITAL_COMMUNITY): Payer: Self-pay

## 2017-12-13 DIAGNOSIS — Z87891 Personal history of nicotine dependence: Secondary | ICD-10-CM | POA: Diagnosis not present

## 2017-12-13 DIAGNOSIS — I10 Essential (primary) hypertension: Secondary | ICD-10-CM | POA: Diagnosis not present

## 2017-12-13 DIAGNOSIS — I252 Old myocardial infarction: Secondary | ICD-10-CM | POA: Diagnosis not present

## 2017-12-13 DIAGNOSIS — Z7982 Long term (current) use of aspirin: Secondary | ICD-10-CM | POA: Diagnosis not present

## 2017-12-13 DIAGNOSIS — Z79899 Other long term (current) drug therapy: Secondary | ICD-10-CM | POA: Diagnosis not present

## 2017-12-13 DIAGNOSIS — D176 Benign lipomatous neoplasm of spermatic cord: Secondary | ICD-10-CM | POA: Diagnosis not present

## 2017-12-13 DIAGNOSIS — K429 Umbilical hernia without obstruction or gangrene: Secondary | ICD-10-CM | POA: Diagnosis not present

## 2017-12-13 DIAGNOSIS — M1990 Unspecified osteoarthritis, unspecified site: Secondary | ICD-10-CM | POA: Diagnosis not present

## 2017-12-13 DIAGNOSIS — N400 Enlarged prostate without lower urinary tract symptoms: Secondary | ICD-10-CM | POA: Diagnosis not present

## 2017-12-13 DIAGNOSIS — K4090 Unilateral inguinal hernia, without obstruction or gangrene, not specified as recurrent: Secondary | ICD-10-CM | POA: Diagnosis not present

## 2017-12-13 DIAGNOSIS — F419 Anxiety disorder, unspecified: Secondary | ICD-10-CM | POA: Diagnosis not present

## 2017-12-13 HISTORY — DX: Essential (primary) hypertension: I10

## 2017-12-13 NOTE — Progress Notes (Signed)
OK to use labs from 11/16/2017 per Dr Rick Duff

## 2017-12-14 ENCOUNTER — Ambulatory Visit (HOSPITAL_COMMUNITY): Payer: Medicare HMO | Admitting: Anesthesiology

## 2017-12-14 ENCOUNTER — Ambulatory Visit (HOSPITAL_COMMUNITY)
Admission: RE | Admit: 2017-12-14 | Discharge: 2017-12-14 | Disposition: A | Discharge status: Home / Self Care | Payer: Medicare HMO | Source: Ambulatory Visit | Attending: General Surgery | Admitting: General Surgery

## 2017-12-14 ENCOUNTER — Encounter (HOSPITAL_COMMUNITY): Payer: Self-pay | Admitting: *Deleted

## 2017-12-14 ENCOUNTER — Encounter (HOSPITAL_COMMUNITY)
Admission: RE | Disposition: A | Discharge status: Home / Self Care | Payer: Self-pay | Source: Ambulatory Visit | Attending: General Surgery

## 2017-12-14 DIAGNOSIS — I10 Essential (primary) hypertension: Secondary | ICD-10-CM | POA: Diagnosis not present

## 2017-12-14 DIAGNOSIS — Z87891 Personal history of nicotine dependence: Secondary | ICD-10-CM | POA: Insufficient documentation

## 2017-12-14 DIAGNOSIS — I252 Old myocardial infarction: Secondary | ICD-10-CM | POA: Diagnosis not present

## 2017-12-14 DIAGNOSIS — F419 Anxiety disorder, unspecified: Secondary | ICD-10-CM | POA: Diagnosis not present

## 2017-12-14 DIAGNOSIS — M1990 Unspecified osteoarthritis, unspecified site: Secondary | ICD-10-CM | POA: Diagnosis not present

## 2017-12-14 DIAGNOSIS — Z79899 Other long term (current) drug therapy: Secondary | ICD-10-CM | POA: Insufficient documentation

## 2017-12-14 DIAGNOSIS — K429 Umbilical hernia without obstruction or gangrene: Secondary | ICD-10-CM | POA: Diagnosis not present

## 2017-12-14 DIAGNOSIS — N400 Enlarged prostate without lower urinary tract symptoms: Secondary | ICD-10-CM | POA: Diagnosis not present

## 2017-12-14 DIAGNOSIS — D176 Benign lipomatous neoplasm of spermatic cord: Secondary | ICD-10-CM | POA: Insufficient documentation

## 2017-12-14 DIAGNOSIS — Z7982 Long term (current) use of aspirin: Secondary | ICD-10-CM | POA: Insufficient documentation

## 2017-12-14 DIAGNOSIS — K4090 Unilateral inguinal hernia, without obstruction or gangrene, not specified as recurrent: Secondary | ICD-10-CM

## 2017-12-14 HISTORY — PX: UMBILICAL HERNIA REPAIR: SHX196

## 2017-12-14 HISTORY — PX: INGUINAL HERNIA REPAIR: SHX194

## 2017-12-14 SURGERY — REPAIR, HERNIA, INGUINAL, ADULT
Anesthesia: General | Laterality: Right

## 2017-12-14 MED ORDER — MIDAZOLAM HCL 2 MG/2ML IJ SOLN
INTRAMUSCULAR | Status: AC
  Filled 2017-12-14: qty 2

## 2017-12-14 MED ORDER — HYDROCODONE-ACETAMINOPHEN 5-325 MG PO TABS: 1.0000 | ORAL_TABLET | ORAL | 0 refills | Status: DC | PRN

## 2017-12-14 MED ORDER — HYDROMORPHONE HCL 1 MG/ML IJ SOLN
0.2500 mg | INTRAMUSCULAR | Status: DC | PRN
  Administered 2017-12-14: 0.5 mg via INTRAVENOUS
  Filled 2017-12-14: qty 0.5

## 2017-12-14 MED ORDER — FENTANYL CITRATE (PF) 100 MCG/2ML IJ SOLN
INTRAMUSCULAR | Status: DC | PRN
  Administered 2017-12-14: 50 ug via INTRAVENOUS
  Administered 2017-12-14: 100 ug via INTRAVENOUS
  Administered 2017-12-14 (×3): 50 ug via INTRAVENOUS

## 2017-12-14 MED ORDER — SODIUM CHLORIDE 0.9 % IR SOLN
Status: DC | PRN
  Administered 2017-12-14: 1

## 2017-12-14 MED ORDER — CEFAZOLIN SODIUM 10 G IJ SOLR
3.0000 g | INTRAMUSCULAR | Status: AC
  Administered 2017-12-14: 3 g via INTRAVENOUS

## 2017-12-14 MED ORDER — HYDROCODONE-ACETAMINOPHEN 7.5-325 MG PO TABS: 1.0000 | ORAL_TABLET | Freq: Once | ORAL | Status: DC | PRN

## 2017-12-14 MED ORDER — PROPOFOL 10 MG/ML IV BOLUS
INTRAVENOUS | Status: AC
  Filled 2017-12-14: qty 20

## 2017-12-14 MED ORDER — GLYCOPYRROLATE 0.2 MG/ML IJ SOLN
INTRAMUSCULAR | Status: AC
  Filled 2017-12-14: qty 1

## 2017-12-14 MED ORDER — ROCURONIUM BROMIDE 50 MG/5ML IV SOLN
INTRAVENOUS | Status: AC
  Filled 2017-12-14: qty 1

## 2017-12-14 MED ORDER — MEPERIDINE HCL 50 MG/ML IJ SOLN
6.2500 mg | INTRAMUSCULAR | Status: DC | PRN
  Filled 2017-12-14: qty 1

## 2017-12-14 MED ORDER — ROCURONIUM BROMIDE 50 MG/5ML IV SOLN
INTRAVENOUS | Status: AC
  Filled 2017-12-14: qty 2

## 2017-12-14 MED ORDER — DEXAMETHASONE SODIUM PHOSPHATE 4 MG/ML IJ SOLN
INTRAMUSCULAR | Status: DC | PRN
  Administered 2017-12-14: 4 mg via INTRAVENOUS

## 2017-12-14 MED ORDER — FENTANYL CITRATE (PF) 100 MCG/2ML IJ SOLN
INTRAMUSCULAR | Status: AC
  Filled 2017-12-14: qty 2

## 2017-12-14 MED ORDER — ONDANSETRON HCL 4 MG/2ML IJ SOLN
INTRAMUSCULAR | Status: DC | PRN
  Administered 2017-12-14: 4 mg via INTRAVENOUS

## 2017-12-14 MED ORDER — DEXAMETHASONE SODIUM PHOSPHATE 4 MG/ML IJ SOLN
INTRAMUSCULAR | Status: AC
  Filled 2017-12-14: qty 1

## 2017-12-14 MED ORDER — KETOROLAC TROMETHAMINE 30 MG/ML IJ SOLN
30.0000 mg | Freq: Once | INTRAMUSCULAR | Status: AC | PRN
  Administered 2017-12-14: 30 mg via INTRAVENOUS
  Filled 2017-12-14: qty 1

## 2017-12-14 MED ORDER — SUGAMMADEX SODIUM 500 MG/5ML IV SOLN
INTRAVENOUS | Status: DC | PRN
  Administered 2017-12-14: 500 mg via INTRAVENOUS

## 2017-12-14 MED ORDER — DEXTROSE 5 % IV SOLN
INTRAVENOUS | Status: AC
  Filled 2017-12-14: qty 3000

## 2017-12-14 MED ORDER — CHLORHEXIDINE GLUCONATE CLOTH 2 % EX PADS: 6.0000 | MEDICATED_PAD | Freq: Once | CUTANEOUS | Status: DC

## 2017-12-14 MED ORDER — EPHEDRINE SULFATE 50 MG/ML IJ SOLN
INTRAMUSCULAR | Status: DC | PRN
  Administered 2017-12-14 (×6): 10 mg via INTRAVENOUS

## 2017-12-14 MED ORDER — BUPIVACAINE LIPOSOME 1.3 % IJ SUSP
INTRAMUSCULAR | Status: DC | PRN
  Administered 2017-12-14: 20 mL

## 2017-12-14 MED ORDER — ROCURONIUM BROMIDE 100 MG/10ML IV SOLN
INTRAVENOUS | Status: DC | PRN
  Administered 2017-12-14: 20 mg via INTRAVENOUS
  Administered 2017-12-14: 10 mg via INTRAVENOUS
  Administered 2017-12-14: 40 mg via INTRAVENOUS
  Administered 2017-12-14: 20 mg via INTRAVENOUS

## 2017-12-14 MED ORDER — FENTANYL CITRATE (PF) 250 MCG/5ML IJ SOLN
INTRAMUSCULAR | Status: AC
  Filled 2017-12-14: qty 5

## 2017-12-14 MED ORDER — BUPIVACAINE LIPOSOME 1.3 % IJ SUSP
INTRAMUSCULAR | Status: AC
Start: 2017-12-14 — End: ?
  Filled 2017-12-14: qty 20

## 2017-12-14 MED ORDER — EPHEDRINE SULFATE 50 MG/ML IJ SOLN
INTRAMUSCULAR | Status: AC
  Filled 2017-12-14: qty 1

## 2017-12-14 MED ORDER — SUGAMMADEX SODIUM 500 MG/5ML IV SOLN
INTRAVENOUS | Status: AC
Start: 2017-12-14 — End: ?
  Filled 2017-12-14: qty 5

## 2017-12-14 MED ORDER — ONDANSETRON HCL 4 MG/2ML IJ SOLN
INTRAMUSCULAR | Status: AC
  Filled 2017-12-14: qty 2

## 2017-12-14 MED ORDER — MIDAZOLAM HCL 5 MG/5ML IJ SOLN
INTRAMUSCULAR | Status: DC | PRN
  Administered 2017-12-14 (×2): 1 mg via INTRAVENOUS

## 2017-12-14 MED ORDER — EPHEDRINE SULFATE 50 MG/ML IJ SOLN
INTRAMUSCULAR | Status: AC
  Filled 2017-12-14: qty 2

## 2017-12-14 MED ORDER — LACTATED RINGERS IV SOLN
INTRAVENOUS | Status: DC
  Administered 2017-12-14 (×2): via INTRAVENOUS

## 2017-12-14 MED ORDER — ONDANSETRON HCL 4 MG/2ML IJ SOLN: 4.0000 mg | Freq: Once | INTRAMUSCULAR | Status: DC | PRN

## 2017-12-14 MED ORDER — PROPOFOL 10 MG/ML IV BOLUS
INTRAVENOUS | Status: DC | PRN
  Administered 2017-12-14: 200 mg via INTRAVENOUS

## 2017-12-14 MED ORDER — GLYCOPYRROLATE 0.2 MG/ML IJ SOLN
INTRAMUSCULAR | Status: DC | PRN
  Administered 2017-12-14: 0.1 mg via INTRAVENOUS

## 2017-12-14 SURGICAL SUPPLY — 45 items
BLADE SURG SZ11 CARB STEEL (BLADE) ×4 IMPLANT
CHLORAPREP W/TINT 26ML (MISCELLANEOUS) ×4 IMPLANT
CLOTH BEACON ORANGE TIMEOUT ST (SAFETY) ×4 IMPLANT
COVER LIGHT HANDLE STERIS (MISCELLANEOUS) ×8 IMPLANT
DECANTER SPIKE VIAL GLASS SM (MISCELLANEOUS) ×4 IMPLANT
DERMABOND ADVANCED (GAUZE/BANDAGES/DRESSINGS) ×2
DERMABOND ADVANCED .7 DNX12 (GAUZE/BANDAGES/DRESSINGS) ×2 IMPLANT
DRAIN PENROSE 18X1/2 LTX STRL (DRAIN) ×4 IMPLANT
ELECT REM PT RETURN 9FT ADLT (ELECTROSURGICAL) ×4
ELECTRODE REM PT RTRN 9FT ADLT (ELECTROSURGICAL) ×2 IMPLANT
GAUZE SPONGE 4X4 12PLY STRL (GAUZE/BANDAGES/DRESSINGS) ×4 IMPLANT
GLOVE BIOGEL PI IND STRL 6.5 (GLOVE) ×2 IMPLANT
GLOVE BIOGEL PI IND STRL 7.0 (GLOVE) ×4 IMPLANT
GLOVE BIOGEL PI INDICATOR 6.5 (GLOVE) ×2
GLOVE BIOGEL PI INDICATOR 7.0 (GLOVE) ×4
GLOVE ECLIPSE 6.5 STRL STRAW (GLOVE) ×8 IMPLANT
GLOVE SURG SS PI 6.5 STRL IVOR (GLOVE) ×8 IMPLANT
GLOVE SURG SS PI 7.5 STRL IVOR (GLOVE) ×8 IMPLANT
GOWN STRL REUS W/ TWL XL LVL3 (GOWN DISPOSABLE) ×2 IMPLANT
GOWN STRL REUS W/TWL LRG LVL3 (GOWN DISPOSABLE) ×8 IMPLANT
GOWN STRL REUS W/TWL XL LVL3 (GOWN DISPOSABLE) ×2
INST SET MINOR GENERAL (KITS) ×4 IMPLANT
KIT TURNOVER KIT A (KITS) ×4 IMPLANT
MANIFOLD NEPTUNE II (INSTRUMENTS) ×4 IMPLANT
MESH MARLEX PLUG MEDIUM (Mesh General) ×4 IMPLANT
MESH VENTRALEX ST 2.5 CRC MED (Mesh General) ×4 IMPLANT
NEEDLE HYPO 21X1.5 SAFETY (NEEDLE) ×4 IMPLANT
NEEDLE HYPO 25X1 1.5 SAFETY (NEEDLE) ×4 IMPLANT
NS IRRIG 1000ML POUR BTL (IV SOLUTION) ×4 IMPLANT
PACK MINOR (CUSTOM PROCEDURE TRAY) ×4 IMPLANT
PAD ARMBOARD 7.5X6 YLW CONV (MISCELLANEOUS) ×4 IMPLANT
SET BASIN LINEN APH (SET/KITS/TRAYS/PACK) ×4 IMPLANT
SOL PREP PROV IODINE SCRUB 4OZ (MISCELLANEOUS) ×4 IMPLANT
SPONGE GAUZE 2X2 8PLY STER LF (GAUZE/BANDAGES/DRESSINGS) ×2
SPONGE GAUZE 2X2 8PLY STRL LF (GAUZE/BANDAGES/DRESSINGS) ×6 IMPLANT
STAPLER VISISTAT (STAPLE) ×4 IMPLANT
SUT ETHIBOND NAB MO 7 #0 18IN (SUTURE) ×4 IMPLANT
SUT MNCRL AB 4-0 PS2 18 (SUTURE) ×8 IMPLANT
SUT NOVA NAB GS-22 2 2-0 T-19 (SUTURE) ×8 IMPLANT
SUT VIC AB 2-0 CT2 27 (SUTURE) ×8 IMPLANT
SUT VIC AB 3-0 SH 27 (SUTURE) ×4
SUT VIC AB 3-0 SH 27X BRD (SUTURE) ×4 IMPLANT
SUT VICRYL AB 3 0 TIES (SUTURE) ×4 IMPLANT
SYR 20CC LL (SYRINGE) ×4 IMPLANT
SYR CONTROL 10ML LL (SYRINGE) ×4 IMPLANT

## 2017-12-14 NOTE — Anesthesia Postprocedure Evaluation (Signed)
Anesthesia Post Note  Patient: Jerry Boyer  Procedure(s) Performed: HERNIA REPAIR INGUINAL ADULT WITH MESH (Right ) HERNIA REPAIR UMBILICAL ADULT WITH MESH (N/A )  Patient location during evaluation: Other Anesthesia Type: General Level of consciousness: awake and alert and oriented Pain management: pain level controlled Vital Signs Assessment: post-procedure vital signs reviewed and stable Respiratory status: spontaneous breathing Cardiovascular status: stable Postop Assessment: no apparent nausea or vomiting Anesthetic complications: no     Last Vitals:  Vitals:   12/14/17 1015 12/14/17 1034  BP: 131/66 123/64  Pulse: 73 75  Resp: 12 18  Temp:  36.8 C  SpO2: 92% 94%    Last Pain:  Vitals:   12/14/17 1034  TempSrc: Oral  PainSc: 4                  Kyah Buesing A

## 2017-12-14 NOTE — Addendum Note (Signed)
Addendum  created 12/14/17 1221 by Nicanor Alcon, MD   Attestation recorded in [place], [place] filed

## 2017-12-14 NOTE — Anesthesia Procedure Notes (Signed)
Procedure Name: Intubation Date/Time: 12/14/2017 7:32 AM Performed by: Jonna Munro, CRNA Pre-anesthesia Checklist: Patient identified, Emergency Drugs available, Suction available, Patient being monitored and Timeout performed Patient Re-evaluated:Patient Re-evaluated prior to induction Oxygen Delivery Method: Circle system utilized Preoxygenation: Pre-oxygenation with 100% oxygen Induction Type: IV induction Laryngoscope Size: Mac and 3 Grade View: Grade I Tube type: Oral Tube size: 7.0 mm Number of attempts: 1 Airway Equipment and Method: Stylet Placement Confirmation: ETT inserted through vocal cords under direct vision,  positive ETCO2 and breath sounds checked- equal and bilateral Secured at: 22 cm Tube secured with: Tape Dental Injury: Teeth and Oropharynx as per pre-operative assessment

## 2017-12-14 NOTE — Anesthesia Preprocedure Evaluation (Signed)
Anesthesia Evaluation  Patient identified by MRN, date of birth, ID band Patient awake    Reviewed: Allergy & Precautions, H&P , NPO status , Patient's Chart, lab work & pertinent test results  Airway Mallampati: II  TM Distance: >3 FB Neck ROM: full    Dental no notable dental hx.    Pulmonary neg pulmonary ROS, former smoker,    Pulmonary exam normal breath sounds clear to auscultation       Cardiovascular Exercise Tolerance: Good hypertension, negative cardio ROS   Rhythm:regular Rate:Normal     Neuro/Psych Anxiety negative neurological ROS  negative psych ROS   GI/Hepatic negative GI ROS, Neg liver ROS,   Endo/Other  negative endocrine ROS  Renal/GU negative Renal ROS  negative genitourinary   Musculoskeletal   Abdominal   Peds  Hematology negative hematology ROS (+)   Anesthesia Other Findings   Reproductive/Obstetrics negative OB ROS                             Anesthesia Physical Anesthesia Plan  ASA: II  Anesthesia Plan: General   Post-op Pain Management:    Induction:   PONV Risk Score and Plan: Ondansetron and Dexamethasone  Airway Management Planned:   Additional Equipment:   Intra-op Plan:   Post-operative Plan:   Informed Consent: I have reviewed the patients History and Physical, chart, labs and discussed the procedure including the risks, benefits and alternatives for the proposed anesthesia with the patient or authorized representative who has indicated his/her understanding and acceptance.   Dental Advisory Given  Plan Discussed with: CRNA  Anesthesia Plan Comments:         Anesthesia Quick Evaluation

## 2017-12-14 NOTE — Transfer of Care (Signed)
Immediate Anesthesia Transfer of Care Note  Patient: Jerry Boyer  Procedure(s) Performed: HERNIA REPAIR INGUINAL ADULT WITH MESH (Right ) HERNIA REPAIR UMBILICAL ADULT WITH MESH (N/A )  Patient Location: PACU  Anesthesia Type:General  Level of Consciousness: awake, alert  and oriented  Airway & Oxygen Therapy: Patient Spontanous Breathing and Patient connected to face mask oxygen  Post-op Assessment: Report given to RN and Post -op Vital signs reviewed and stable  Post vital signs: Reviewed and stable  Last Vitals:  Vitals Value Taken Time  BP    Temp    Pulse 76 12/14/2017  9:22 AM  Resp 10 12/14/2017  9:22 AM  SpO2 94 % 12/14/2017  9:22 AM  Vitals shown include unvalidated device data.  Last Pain:  Vitals:   12/14/17 0649  TempSrc: Oral  PainSc: 0-No pain      Patients Stated Pain Goal: 6 (29/09/03 0149)  Complications: No apparent anesthesia complications

## 2017-12-14 NOTE — Discharge Instructions (Signed)
Open Hernia Repair, Adult, Care After °This sheet gives you information about how to care for yourself after your procedure. Your health care provider may also give you more specific instructions. If you have problems or questions, contact your health care provider. °What can I expect after the procedure? °After the procedure, it is common to have: °· Mild discomfort. °· Slight bruising. °· Minor swelling. °· Pain in the abdomen. ° °Follow these instructions at home: °Incision care ° °· Follow instructions from your health care provider about how to take care of your incision area. Make sure you: °? Wash your hands with soap and water before you change your bandage (dressing). If soap and water are not available, use hand sanitizer. °? Change your dressing as told by your health care provider. °? Leave stitches (sutures), skin glue, or adhesive strips in place. These skin closures may need to stay in place for 2 weeks or longer. If adhesive strip edges start to loosen and curl up, you may trim the loose edges. Do not remove adhesive strips completely unless your health care provider tells you to do that. °· Check your incision area every day for signs of infection. Check for: °? More redness, swelling, or pain. °? More fluid or blood. °? Warmth. °? Pus or a bad smell. °Activity °· Do not drive or use heavy machinery while taking prescription pain medicine. Do not drive until your health care provider approves. °· Until your health care provider approves: °? Do not lift anything that is heavier than 10 lb (4.5 kg). °? Do not play contact sports. °· Return to your normal activities as told by your health care provider. Ask your health care provider what activities are safe. °General instructions °· To prevent or treat constipation while you are taking prescription pain medicine, your health care provider may recommend that you: °? Drink enough fluid to keep your urine clear or pale yellow. °? Take over-the-counter or  prescription medicines. °? Eat foods that are high in fiber, such as fresh fruits and vegetables, whole grains, and beans. °? Limit foods that are high in fat and processed sugars, such as fried and sweet foods. °· Take over-the-counter and prescription medicines only as told by your health care provider. °· Do not take tub baths or go swimming until your health care provider approves. °· Keep all follow-up visits as told by your health care provider. This is important. °Contact a health care provider if: °· You develop a rash. °· You have more redness, swelling, or pain around your incision. °· You have more fluid or blood coming from your incision. °· Your incision feels warm to the touch. °· You have pus or a bad smell coming from your incision. °· You have a fever or chills. °· You have blood in your stool (feces). °· You have not had a bowel movement in 2-3 days. °· Your pain is not controlled with medicine. °Get help right away if: °· You have chest pain or shortness of breath. °· You feel light-headed or feel faint. °· You have severe pain. °· You vomit and your pain is worse. °This information is not intended to replace advice given to you by your health care provider. Make sure you discuss any questions you have with your health care provider. °Document Released: 02/03/2005 Document Revised: 02/04/2016 Document Reviewed: 12/29/2015 °Elsevier Interactive Patient Education © 2018 [employer] ° °

## 2017-12-14 NOTE — Interval H&P Note (Signed)
History and Physical Interval Note:  12/14/2017 7:13 AM  Jerry Boyer  has presented today for surgery, with the diagnosis of right inguinal hernia, umbilical hernia  The various methods of treatment have been discussed with the patient and family. After consideration of risks, benefits and other options for treatment, the patient has consented to  Procedure(s): HERNIA REPAIR INGUINAL ADULT WITH MESH (Right) HERNIA REPAIR UMBILICAL ADULT WITH MESH (N/A) as a surgical intervention .  The patient's history has been reviewed, patient examined, no change in status, stable for surgery.  I have reviewed the patient's chart and labs.  Questions were answered to the patient's satisfaction.     Aviva Signs

## 2017-12-14 NOTE — Op Note (Signed)
Patient:  Jerry Boyer  DOB:  03-17-1946  MRN:  [id number]   Preop Diagnosis: Right inguinal hernia, umbilical hernia  Postop Diagnosis: Same  Procedure: Right inguinal herniorrhaphy with mesh, umbilical herniorrhaphy with mesh  Surgeon: Aviva Signs, MD  Anes: General  Indications: Patient is a 72 year old white male who presents with a symptomatic right inguinal hernia as well as an umbilical hernia.  The risks and benefits of both procedures including bleeding, infection, mesh use, and the possibility of recurrence of the hernias were fully explained to the patient, who gave informed consent.  Procedure note: The patient was placed in supine position.  After general anesthesia was administered, the abdomen was prepped and draped using usual sterile technique with DuraPrep.  Surgical site confirmation was performed.  An incision was made in the right groin region down to the external Bleich aponeurosis.  The aponeurosis was incised to the external ring.  A Penrose drain was placed around the spermatic cord.  The vase deferens was noted within the spermatic cord.  A lipoma of the cord was found and this was excised and a high ligation was performed using a 3-0 Vicryl tie.  Patient had an indirect hernia sac and this was freed away from the spermatic cord and inverted.  A medium sized Bard PerFix plug was then inserted.  An onlay patch was then placed along the floor of the inguinal canal and secured superiorly to the conjoined tendon and inferiorly to the shelving edge of Poupart's ligament using 2-0 Novafil interrupted sutures.  The internal ring was re-created using a 2-0 Novafil interrupted suture.  The external oblique aponeurosis was reapproximated using a 2-0 Vicryl running suture.  The subcutaneous layer was reapproximated using 3-0 Vicryl interrupted sutures.  Exparel was instilled into the surrounding wound.  The skin was closed using a 4-0 Monocryl subcuticular suture.  Dermabond was  applied at the end of both procedures.  Next, an incision was made inferiorly to the umbilicus.  The dissection was taken down to the fascia.  The umbilicus was freed away from the underlying fascia.  Omentum was noted to be attached to the hernia sac and this was freed away from the hernia sac and reduced.  The hernia sac was excised down to the fascia.  A 2 cm umbilical defect was noted.  A 6.4 cm Bard Ventralax ST patch was then inserted and secured to the fascia using 0 Ethibond interrupted sutures.  The overlying fascia was reapproximated transversely using 0 Ethibond interrupted sutures.  The base the umbilicus was secured back to the fascia using a 2-0 Vicryl interrupted suture.  Subcutaneous layer was reapproximated using 3-0 Vicryl interrupted suture.  Exparel was instilled into the surrounding wound.  The skin was closed using a 4-0 Monocryl subcuticular suture.  Dermabond was then applied.  All tape and needle counts were correct at the end of the procedures.  The patient was awakened and transferred to PACU in stable condition.  Complications: None  EBL: Minimal  Specimen: None

## 2017-12-17 ENCOUNTER — Encounter (HOSPITAL_COMMUNITY): Payer: Self-pay | Admitting: General Surgery

## 2017-12-20 ENCOUNTER — Ambulatory Visit (INDEPENDENT_AMBULATORY_CARE_PROVIDER_SITE_OTHER): Payer: Self-pay | Admitting: General Surgery

## 2017-12-20 ENCOUNTER — Encounter: Payer: Self-pay | Admitting: General Surgery

## 2017-12-20 VITALS — BP 129/53 | HR 77 | Temp 96.7°F | Resp 22 | Wt 283.0 lb

## 2017-12-20 DIAGNOSIS — Z09 Encounter for follow-up examination after completed treatment for conditions other than malignant neoplasm: Secondary | ICD-10-CM

## 2017-12-20 NOTE — Patient Instructions (Signed)
Colonoscopy, Adult A colonoscopy is an exam to look at the entire large intestine. During the exam, a lubricated, bendable tube is inserted into the anus and then passed into the rectum, colon, and other parts of the large intestine. A colonoscopy is often done as a part of normal colorectal screening or in response to certain symptoms, such as anemia, persistent diarrhea, abdominal pain, and blood in the stool. The exam can help screen for and diagnose medical problems, including:  Tumors.  Polyps.  Inflammation.  Areas of bleeding.  Tell a health care provider about:  Any allergies you have.  All medicines you are taking, including vitamins, herbs, eye drops, creams, and over-the-counter medicines.  Any problems you or family members have had with anesthetic medicines.  Any blood disorders you have.  Any surgeries you have had.  Any medical conditions you have.  Any problems you have had passing stool. What are the risks? Generally, this is a safe procedure. However, problems may occur, including:  Bleeding.  A tear in the intestine.  A reaction to medicines given during the exam.  Infection (rare).  What happens before the procedure? Eating and drinking restrictions Follow instructions from your health care provider about eating and drinking, which may include:  A few days before the procedure - follow a low-fiber diet. Avoid nuts, seeds, dried fruit, raw fruits, and vegetables.  1-3 days before the procedure - follow a clear liquid diet. Drink only clear liquids, such as clear broth or bouillon, black coffee or tea, clear juice, clear soft drinks or sports drinks, gelatin dessert, and popsicles. Avoid any liquids that contain red or purple dye.  On the day of the procedure - do not eat or drink anything during the 2 hours before the procedure, or within the time period that your health care provider recommends.  Bowel prep If you were prescribed an oral bowel prep  to clean out your colon:  Take it as told by your health care provider. Starting the day before your procedure, you will need to drink a large amount of medicated liquid. The liquid will cause you to have multiple loose stools until your stool is almost clear or light green.  If your skin or anus gets irritated from diarrhea, you may use these to relieve the irritation: ? Medicated wipes, such as adult wet wipes with aloe and vitamin E. ? A skin soothing-product like petroleum jelly.  If you vomit while drinking the bowel prep, take a break for up to 60 minutes and then begin the bowel prep again. If vomiting continues and you cannot take the bowel prep without vomiting, call your health care provider.  General instructions  Ask your health care provider about changing or stopping your regular medicines. This is especially important if you are taking diabetes medicines or blood thinners.  Plan to have someone take you home from the hospital or clinic. What happens during the procedure?  An IV tube may be inserted into one of your veins.  You will be given medicine to help you relax (sedative).  To reduce your risk of infection: ? Your health care team will wash or sanitize their hands. ? Your anal area will be washed with soap.  You will be asked to lie on your side with your knees bent.  Your health care provider will lubricate a long, thin, flexible tube. The tube will have a camera and a light on the end.  The tube will be inserted into your   anus.  The tube will be gently eased through your rectum and colon.  Air will be delivered into your colon to keep it open. You may feel some pressure or cramping.  The camera will be used to take images during the procedure.  A small tissue sample may be removed from your body to be examined under a microscope (biopsy). If any potential problems are found, the tissue will be sent to a lab for testing.  If small polyps are found, your  health care provider may remove them and have them checked for cancer cells.  The tube that was inserted into your anus will be slowly removed. The procedure may vary among health care providers and hospitals. What happens after the procedure?  Your blood pressure, heart rate, breathing rate, and blood oxygen level will be monitored until the medicines you were given have worn off.  Do not drive for 24 hours after the exam.  You may have a small amount of blood in your stool.  You may pass gas and have mild abdominal cramping or bloating due to the air that was used to inflate your colon during the exam.  It is up to you to get the results of your procedure. Ask your health care provider, or the department performing the procedure, when your results will be ready. This information is not intended to replace advice given to you by your health care provider. Make sure you discuss any questions you have with your health care provider. Document Released: 07/14/2000 Document Revised: 05/17/2016 Document Reviewed: 09/28/2015 Elsevier Interactive Patient Education  2018 [employer] Inc[employer]  

## 2017-12-20 NOTE — Progress Notes (Signed)
Subjective:     Jerry Boyer  Status post umbilical herniorrhaphy with mesh, right inguinal herniorrhaphy with mesh.  Doing very well.  Denies any significant incisional pain.  Is pleased with the results. Objective:    BP (!) 129/53 (BP Location: Left Arm, Patient Position: Sitting, Cuff Size: Large)   Pulse 77   Temp (!) 96.7 F (35.9 C) (Temporal)   Resp (!) 22   Wt 283 lb (128.4 kg)   BMI 36.34 kg/m   General:  alert, cooperative and no distress  Abdomen soft, incisions healing well.     Assessment:    Doing well postoperatively.    Plan:   Increase activity as able.  Will call in a few months to schedule screening colonoscopy.

## 2018-01-24 NOTE — H&P (Signed)
Jerry Boyer is an 72 y.o. male.   Chief Complaint: Need for screening colonoscopy HPI: Patient is a 72 year old white male who presents for screening colonoscopy.  He denies any family history of colon cancer.  He has not had a screening colonoscopy for many years.  He denies any abdominal pain, weight loss, or blood in his stools.  He currently has no abdominal pain.  Past Medical History:  Diagnosis Date  . Anxiety   . Arthritis   . BPH (benign prostatic hyperplasia)   . Hypertension     Past Surgical History:  Procedure Laterality Date  . BACK SURGERY     lumbar disc  . CATARACT EXTRACTION W/PHACO Right 02/05/2017   Procedure: CATARACT EXTRACTION PHACO AND INTRAOCULAR LENS PLACEMENT (IOC);  Surgeon: Tonny Branch, MD;  Location: [facility];  Service: Ophthalmology;  Laterality: Right;  CDE: 7.36  . CATARACT EXTRACTION W/PHACO Left 02/19/2017   Procedure: CATARACT EXTRACTION PHACO AND INTRAOCULAR LENS PLACEMENT LEFT EYE;  Surgeon: Tonny Branch, MD;  Location: [facility];  Service: Ophthalmology;  Laterality: Left;  CDE: 7.98  . INGUINAL HERNIA REPAIR Right 12/14/2017   Procedure: HERNIA REPAIR INGUINAL ADULT WITH MESH;  Surgeon: Aviva Signs, MD;  Location: [facility];  Service: General;  Laterality: Right;  . JOINT REPLACEMENT Left   . UMBILICAL HERNIA REPAIR N/A 12/14/2017   Procedure: HERNIA REPAIR UMBILICAL ADULT WITH MESH;  Surgeon: Aviva Signs, MD;  Location: [facility];  Service: General;  Laterality: N/A;    No family history on file. Social History:  reports that he quit smoking about 16 years ago. His smoking use included cigarettes. He has a 20.00 pack-year smoking history. He has never used smokeless tobacco. He reports that he drinks about 12.6 oz of alcohol per week. He reports that he does not use drugs.  Allergies: No Known Allergies  No medications prior to admission.    No results found for this or any previous visit (from the past 48 hour(s)). No results found.  Review of  Systems  Constitutional: Negative.   HENT: Negative.   Eyes: Negative.   Respiratory: Negative.   Cardiovascular: Negative.   Gastrointestinal: Negative.   Genitourinary: Negative.   Musculoskeletal: Negative.   Skin: Negative.   Neurological: Negative.   Endo/Heme/Allergies: Negative.   Psychiatric/Behavioral: Negative.     There were no vitals taken for this visit. Physical Exam  Vitals reviewed. Constitutional: He is oriented to person, place, and time. He appears well-developed and well-nourished.  HENT:  Head: Normocephalic and atraumatic.  Cardiovascular: Normal rate, regular rhythm and normal heart sounds. Exam reveals no gallop and no friction rub.  No murmur heard. Respiratory: Effort normal and breath sounds normal. No respiratory distress. He has no wheezes. He has no rales.  GI: Soft. Bowel sounds are normal. He exhibits no distension. There is no tenderness. There is no rebound and no guarding.  Neurological: He is alert and oriented to person, place, and time.  Skin: Skin is warm and dry.     Assessment/Plan Impression: Need for screening colonoscopy Plan: Patient is scheduled for a screening colonoscopy on 02/12/2018.  The risks and benefits of the procedure including bleeding and perforation were fully explained to the patient, who gave informed consent.  Aviva Signs, MD 01/24/2018, 11:25 AM

## 2018-01-29 ENCOUNTER — Inpatient Hospital Stay (HOSPITAL_COMMUNITY)
Admission: EM | Admit: 2018-01-29 | Discharge: 2018-01-31 | DRG: 872 | Disposition: A | Discharge status: Home / Self Care | Payer: Medicare HMO | Attending: Family Medicine | Admitting: Family Medicine

## 2018-01-29 ENCOUNTER — Encounter (HOSPITAL_COMMUNITY): Payer: Self-pay | Admitting: *Deleted

## 2018-01-29 ENCOUNTER — Emergency Department (HOSPITAL_COMMUNITY): Payer: Medicare HMO

## 2018-01-29 ENCOUNTER — Other Ambulatory Visit: Payer: Self-pay

## 2018-01-29 DIAGNOSIS — I214 Non-ST elevation (NSTEMI) myocardial infarction: Secondary | ICD-10-CM | POA: Diagnosis present

## 2018-01-29 DIAGNOSIS — Z961 Presence of intraocular lens: Secondary | ICD-10-CM | POA: Diagnosis present

## 2018-01-29 DIAGNOSIS — N400 Enlarged prostate without lower urinary tract symptoms: Secondary | ICD-10-CM | POA: Diagnosis present

## 2018-01-29 DIAGNOSIS — I10 Essential (primary) hypertension: Secondary | ICD-10-CM | POA: Diagnosis not present

## 2018-01-29 DIAGNOSIS — A419 Sepsis, unspecified organism: Secondary | ICD-10-CM | POA: Diagnosis not present

## 2018-01-29 DIAGNOSIS — K429 Umbilical hernia without obstruction or gangrene: Secondary | ICD-10-CM | POA: Diagnosis not present

## 2018-01-29 DIAGNOSIS — R000 Tachycardia, unspecified: Secondary | ICD-10-CM | POA: Diagnosis not present

## 2018-01-29 DIAGNOSIS — F419 Anxiety disorder, unspecified: Secondary | ICD-10-CM | POA: Diagnosis not present

## 2018-01-29 DIAGNOSIS — R110 Nausea: Secondary | ICD-10-CM | POA: Diagnosis present

## 2018-01-29 DIAGNOSIS — Z79899 Other long term (current) drug therapy: Secondary | ICD-10-CM | POA: Diagnosis not present

## 2018-01-29 DIAGNOSIS — F329 Major depressive disorder, single episode, unspecified: Secondary | ICD-10-CM | POA: Diagnosis present

## 2018-01-29 DIAGNOSIS — R05 Cough: Secondary | ICD-10-CM | POA: Diagnosis not present

## 2018-01-29 DIAGNOSIS — R778 Other specified abnormalities of plasma proteins: Secondary | ICD-10-CM | POA: Diagnosis present

## 2018-01-29 DIAGNOSIS — Z87891 Personal history of nicotine dependence: Secondary | ICD-10-CM | POA: Diagnosis not present

## 2018-01-29 DIAGNOSIS — I313 Pericardial effusion (noninflammatory): Secondary | ICD-10-CM | POA: Diagnosis not present

## 2018-01-29 DIAGNOSIS — R509 Fever, unspecified: Secondary | ICD-10-CM | POA: Diagnosis not present

## 2018-01-29 DIAGNOSIS — Z7982 Long term (current) use of aspirin: Secondary | ICD-10-CM | POA: Diagnosis not present

## 2018-01-29 DIAGNOSIS — R748 Abnormal levels of other serum enzymes: Secondary | ICD-10-CM | POA: Diagnosis not present

## 2018-01-29 DIAGNOSIS — N390 Urinary tract infection, site not specified: Secondary | ICD-10-CM | POA: Diagnosis present

## 2018-01-29 DIAGNOSIS — R7989 Other specified abnormal findings of blood chemistry: Secondary | ICD-10-CM | POA: Diagnosis present

## 2018-01-29 DIAGNOSIS — Z9842 Cataract extraction status, left eye: Secondary | ICD-10-CM | POA: Diagnosis not present

## 2018-01-29 DIAGNOSIS — E785 Hyperlipidemia, unspecified: Secondary | ICD-10-CM | POA: Diagnosis not present

## 2018-01-29 DIAGNOSIS — B961 Klebsiella pneumoniae [K. pneumoniae] as the cause of diseases classified elsewhere: Secondary | ICD-10-CM | POA: Diagnosis not present

## 2018-01-29 DIAGNOSIS — I248 Other forms of acute ischemic heart disease: Secondary | ICD-10-CM | POA: Diagnosis not present

## 2018-01-29 LAB — TROPONIN I
TROPONIN I: 0.13 ng/mL — AB (ref <0.03)
TROPONIN I: 0.43 ng/mL — AB (ref <0.03)

## 2018-01-29 LAB — URINALYSIS, MICROSCOPIC (REFLEX): WBC, UA: >50 WBC/hpf (ref 0–5)

## 2018-01-29 LAB — CBC
HEMATOCRIT: 38 % — AB (ref 39.0–52.0)
HEMOGLOBIN: 12.6 g/dL — AB (ref 13.0–17.0)
MCH: 31.7 pg (ref 26.0–34.0)
MCHC: 33.2 g/dL (ref 30.0–36.0)
MCV: 95.5 fL (ref 78.0–100.0)
Platelets: 173 10*3/uL (ref 150–400)
RBC: 3.98 MIL/uL — ABNORMAL LOW (ref 4.22–5.81)
RDW: 13.9 % (ref 11.5–15.5)
WBC: 10.2 10*3/uL (ref 4.0–10.5)

## 2018-01-29 LAB — COMPREHENSIVE METABOLIC PANEL
ALT: 28 U/L (ref 0–44)
AST: 25 U/L (ref 15–41)
Albumin: 3.9 g/dL (ref 3.5–5.0)
Alkaline Phosphatase: 67 U/L (ref 38–126)
Anion gap: 9 (ref 5–15)
BILIRUBIN TOTAL: 1 mg/dL (ref 0.3–1.2)
BUN: 23 mg/dL (ref 8–23)
CALCIUM: 9 mg/dL (ref 8.9–10.3)
CHLORIDE: 105 mmol/L (ref 98–111)
CO2: 22 mmol/L (ref 22–32)
CREATININE: 0.89 mg/dL (ref 0.61–1.24)
Glucose, Bld: 105 mg/dL — ABNORMAL HIGH (ref 70–99)
Potassium: 4.5 mmol/L (ref 3.5–5.1)
Sodium: 136 mmol/L (ref 135–145)
TOTAL PROTEIN: 6.8 g/dL (ref 6.5–8.1)

## 2018-01-29 LAB — URINALYSIS, ROUTINE W REFLEX MICROSCOPIC
Bilirubin Urine: NEGATIVE
GLUCOSE, UA: NEGATIVE mg/dL
Nitrite: POSITIVE — AB
Protein, ur: 30 mg/dL — AB
pH: 5.5 (ref 5.0–8.0)

## 2018-01-29 LAB — CBC WITH DIFFERENTIAL/PLATELET
BASOS ABS: 0 10*3/uL (ref 0.0–0.1)
Basophils Relative: 0 %
EOS ABS: 0.1 10*3/uL (ref 0.0–0.7)
Eosinophils Relative: 1 %
HCT: 40.6 % (ref 39.0–52.0)
HEMOGLOBIN: 13.8 g/dL (ref 13.0–17.0)
LYMPHS ABS: 0.3 10*3/uL — AB (ref 0.7–4.0)
Lymphocytes Relative: 3 %
MCH: 32.1 pg (ref 26.0–34.0)
MCHC: 34 g/dL (ref 30.0–36.0)
MCV: 94.4 fL (ref 78.0–100.0)
Monocytes Absolute: 0.4 10*3/uL (ref 0.1–1.0)
Monocytes Relative: 4 %
NEUTROS PCT: 92 %
Neutro Abs: 8.6 10*3/uL — ABNORMAL HIGH (ref 1.7–7.7)
PLATELETS: 189 10*3/uL (ref 150–400)
RBC: 4.3 MIL/uL (ref 4.22–5.81)
RDW: 13.9 % (ref 11.5–15.5)
WBC: 9.3 10*3/uL (ref 4.0–10.5)

## 2018-01-29 LAB — CREATININE, SERUM
Creatinine, Ser: 0.96 mg/dL (ref 0.61–1.24)
GFR calc Af Amer: >60 mL/min (ref >60)

## 2018-01-29 LAB — PROTIME-INR
INR: 0.98
PROTHROMBIN TIME: 12.9 s (ref 11.4–15.2)

## 2018-01-29 LAB — LACTIC ACID, PLASMA: Lactic Acid, Venous: 1.7 mmol/L (ref 0.5–1.9)

## 2018-01-29 LAB — I-STAT CG4 LACTIC ACID, ED: Lactic Acid, Venous: 2.32 mmol/L (ref 0.5–1.9)

## 2018-01-29 MED ORDER — ONDANSETRON HCL 4 MG PO TABS: 4.0000 mg | ORAL_TABLET | Freq: Four times a day (QID) | ORAL | Status: DC | PRN

## 2018-01-29 MED ORDER — ASPIRIN 81 MG PO CHEW
324.0000 mg | CHEWABLE_TABLET | Freq: Once | ORAL | Status: AC
Start: 2018-01-29 — End: 2018-01-29
  Administered 2018-01-29: 324 mg via ORAL
  Filled 2018-01-29: qty 4

## 2018-01-29 MED ORDER — SODIUM CHLORIDE 0.9 % IV SOLN: 1000.0000 mL | INTRAVENOUS | Status: DC

## 2018-01-29 MED ORDER — VANCOMYCIN HCL 10 G IV SOLR
2000.0000 mg | Freq: Once | INTRAVENOUS | Status: AC
  Administered 2018-01-29: 2000 mg via INTRAVENOUS
  Filled 2018-01-29: qty 2000

## 2018-01-29 MED ORDER — ACETAMINOPHEN 325 MG PO TABS: 650.0000 mg | ORAL_TABLET | Freq: Four times a day (QID) | ORAL | Status: DC | PRN

## 2018-01-29 MED ORDER — ACETAMINOPHEN 650 MG RE SUPP: 650.0000 mg | Freq: Four times a day (QID) | RECTAL | Status: DC | PRN

## 2018-01-29 MED ORDER — CITALOPRAM HYDROBROMIDE 20 MG PO TABS
40.0000 mg | ORAL_TABLET | Freq: Every day | ORAL | Status: DC
  Administered 2018-01-30 – 2018-01-31 (×2): 40 mg via ORAL
  Filled 2018-01-29 (×2): qty 2

## 2018-01-29 MED ORDER — ORAL CARE MOUTH RINSE
15.0000 mL | Freq: Two times a day (BID) | OROMUCOSAL | Status: DC
  Administered 2018-01-30 – 2018-01-31 (×3): 15 mL via OROMUCOSAL

## 2018-01-29 MED ORDER — EZETIMIBE 10 MG PO TABS
10.0000 mg | ORAL_TABLET | Freq: Every day | ORAL | Status: DC
  Administered 2018-01-30 – 2018-01-31 (×2): 10 mg via ORAL
  Filled 2018-01-29 (×2): qty 1

## 2018-01-29 MED ORDER — SODIUM CHLORIDE 0.9 % IV BOLUS
1000.0000 mL | Freq: Once | INTRAVENOUS | Status: AC
  Administered 2018-01-29: 1000 mL via INTRAVENOUS

## 2018-01-29 MED ORDER — ENOXAPARIN SODIUM 40 MG/0.4ML ~~LOC~~ SOLN
40.0000 mg | SUBCUTANEOUS | Status: DC
  Filled 2018-01-29 (×2): qty 0.4

## 2018-01-29 MED ORDER — PIPERACILLIN-TAZOBACTAM 3.375 G IVPB: 3.3750 g | Freq: Three times a day (TID) | INTRAVENOUS | Status: DC

## 2018-01-29 MED ORDER — DOXAZOSIN MESYLATE 2 MG PO TABS
4.0000 mg | ORAL_TABLET | Freq: Every day | ORAL | Status: DC
  Administered 2018-01-29 – 2018-01-30 (×2): 4 mg via ORAL
  Filled 2018-01-29 (×2): qty 2

## 2018-01-29 MED ORDER — VANCOMYCIN HCL IN DEXTROSE 1-5 GM/200ML-% IV SOLN: 1000.0000 mg | Freq: Once | INTRAVENOUS | Status: DC

## 2018-01-29 MED ORDER — SODIUM CHLORIDE 0.9 % IV SOLN
1.0000 g | INTRAVENOUS | Status: DC
  Administered 2018-01-29 – 2018-01-30 (×2): 1 g via INTRAVENOUS
  Filled 2018-01-29: qty 1
  Filled 2018-01-29: qty 10
  Filled 2018-01-29: qty 1

## 2018-01-29 MED ORDER — VANCOMYCIN HCL IN DEXTROSE 1-5 GM/200ML-% IV SOLN: 1000.0000 mg | Freq: Two times a day (BID) | INTRAVENOUS | Status: DC

## 2018-01-29 MED ORDER — SODIUM CHLORIDE 0.9 % IV SOLN
INTRAVENOUS | Status: DC
  Administered 2018-01-29 – 2018-01-31 (×6): via INTRAVENOUS

## 2018-01-29 MED ORDER — ASPIRIN EC 81 MG PO TBEC
81.0000 mg | DELAYED_RELEASE_TABLET | Freq: Every day | ORAL | Status: DC
  Administered 2018-01-30 – 2018-01-31 (×2): 81 mg via ORAL
  Filled 2018-01-29 (×2): qty 1

## 2018-01-29 MED ORDER — ONDANSETRON HCL 4 MG/2ML IJ SOLN: 4.0000 mg | Freq: Four times a day (QID) | INTRAMUSCULAR | Status: DC | PRN

## 2018-01-29 MED ORDER — ATORVASTATIN CALCIUM 40 MG PO TABS
40.0000 mg | ORAL_TABLET | Freq: Every day | ORAL | Status: DC
  Administered 2018-01-30 – 2018-01-31 (×2): 40 mg via ORAL
  Filled 2018-01-29 (×2): qty 1

## 2018-01-29 MED ORDER — PIPERACILLIN-TAZOBACTAM 3.375 G IVPB 30 MIN
3.3750 g | Freq: Once | INTRAVENOUS | Status: AC
  Administered 2018-01-29: 3.375 g via INTRAVENOUS
  Filled 2018-01-29: qty 50

## 2018-01-29 NOTE — ED Notes (Signed)
Report called to the floor, Lab at the beside

## 2018-01-29 NOTE — ED Notes (Signed)
CRITICAL VALUE ALERT  Critical Value:  Troponin 0.13  Date & Time Notied:  01/29/18  Provider Notified:  Dr, Thurnell Garbe

## 2018-01-29 NOTE — ED Provider Notes (Signed)
[facility] Provider Note   CSN: [id number] Arrival date & time: 01/29/18  1216     History   Chief Complaint Chief Complaint  Patient presents with  . Fever    HPI Jerry Boyer is a 72 y.o. male.   Fever      Pt was seen at 1240. Per pt and his family, c/o gradual onset and persistence of constant "cough" for the past 1 week, worse today. States he was outside today working in the yard (riding on a Animator and spraying the weeds). When he came inside approximately 2 hours later, he felt "chills," "hot" and "nausated." States his cough was "worse." Denies chills/subjective fevers before today. EMS gave APAP en route. Denies CP/palpitations, no SOB, no vomiting/diarrhea, no abd pain, no back pain, no neck pain, no headache, no rash.     Past Medical History:  Diagnosis Date  . Anxiety   . Arthritis   . BPH (benign prostatic hyperplasia)   . Hypertension     Patient Active Problem List   Diagnosis Date Noted  . Non-recurrent unilateral inguinal hernia without obstruction or gangrene   . Umbilical hernia without obstruction and without gangrene     Past Surgical History:  Procedure Laterality Date  . BACK SURGERY     lumbar disc  . CATARACT EXTRACTION W/PHACO Right 02/05/2017   Procedure: CATARACT EXTRACTION PHACO AND INTRAOCULAR LENS PLACEMENT (IOC);  Surgeon: Tonny Branch, MD;  Location: [facility];  Service: Ophthalmology;  Laterality: Right;  CDE: 7.36  . CATARACT EXTRACTION W/PHACO Left 02/19/2017   Procedure: CATARACT EXTRACTION PHACO AND INTRAOCULAR LENS PLACEMENT LEFT EYE;  Surgeon: Tonny Branch, MD;  Location: [facility];  Service: Ophthalmology;  Laterality: Left;  CDE: 7.98  . INGUINAL HERNIA REPAIR Right 12/14/2017   Procedure: HERNIA REPAIR INGUINAL ADULT WITH MESH;  Surgeon: Aviva Signs, MD;  Location: [facility];  Service: General;  Laterality: Right;  . JOINT REPLACEMENT Left   . UMBILICAL HERNIA REPAIR N/A 12/14/2017   Procedure: HERNIA  REPAIR UMBILICAL ADULT WITH MESH;  Surgeon: Aviva Signs, MD;  Location: [facility];  Service: General;  Laterality: N/A;        Home Medications    Prior to Admission medications   Medication Sig Start Date End Date Taking? Authorizing Provider  aspirin EC 81 MG tablet Take 81 mg by mouth daily.    [provider]  atorvastatin (LIPITOR) 40 MG tablet Take 40 mg by mouth daily.    [provider]  b complex vitamins tablet Take 1 tablet by mouth daily.    [provider]  Bilberry 150 MG CAPS Take 150 mg by mouth daily.    [provider]  citalopram (CELEXA) 40 MG tablet Take 40 mg by mouth daily.    [provider]  Coenzyme Q10 (COQ10) 100 MG CAPS Take 100 mg by mouth daily.    [provider]  doxazosin (CARDURA) 4 MG tablet Take 4 mg by mouth at bedtime. 11/20/17   [provider]  ezetimibe (ZETIA) 10 MG tablet Take 10 mg by mouth daily. 11/29/17   [provider]  HYDROcodone-acetaminophen (NORCO) 5-325 MG tablet Take 1 tablet by mouth every 4 (four) hours as needed for moderate pain. Patient not taking: Reported on 12/20/2017 12/14/17   Aviva Signs, MD  lisinopril (PRINIVIL,ZESTRIL) 20 MG tablet Take 20 mg by mouth daily.    [provider]  meloxicam (MOBIC) 7.5 MG tablet Take 7.5 mg  by mouth 2 (two) times daily.    [provider]  Multiple Vitamins-Minerals (CENTRUM SILVER ADULT 50+ PO) Take 1 tablet by mouth daily.    [provider]  Omega-3 Fatty Acids (OMEGA-3 FISH OIL PO) Take 1 capsule by mouth daily.    [provider]  vitamin C (ASCORBIC ACID) 500 MG tablet Take 500 mg by mouth daily.    [provider]    Family History No family history on file.  Social History Social History   Tobacco Use  . Smoking status: Former Smoker    Packs/day: 1.00    Years: 20.00    Pack years: 20.00    Types: Cigarettes    Last attempt to quit: 01/29/2001    Years since  quitting: 17.0  . Smokeless tobacco: Never Used  Substance Use Topics  . Alcohol use: Yes    Alcohol/week: 12.6 oz    Types: 21 Cans of beer per week  . Drug use: No     Allergies   Patient has no known allergies.   Review of Systems Review of Systems  Constitutional: Positive for fever.   ROS: Statement: All systems negative except as marked or noted in the HPI; Constitutional: +fever and chills. ; ; Eyes: Negative for eye pain, redness and discharge. ; ; ENMT: Negative for ear pain, hoarseness, nasal congestion, sinus pressure and sore throat. ; ; Cardiovascular: Negative for chest pain, palpitations, diaphoresis, dyspnea and peripheral edema. ; ; Respiratory: +cough. Negative for wheezing and stridor. ; ; Gastrointestinal: +nausea. Negative for vomiting, diarrhea, abdominal pain, blood in stool, hematemesis, jaundice and rectal bleeding. . ; ; Genitourinary: Negative for dysuria, flank pain and hematuria. ; ; Musculoskeletal: Negative for back pain and neck pain. Negative for swelling and trauma.; ; Skin: Negative for pruritus, rash, abrasions, blisters, bruising and skin lesion.; ; Neuro: Negative for headache, lightheadedness and neck stiffness. Negative for weakness, altered level of consciousness, altered mental status, extremity weakness, paresthesias, involuntary movement, seizure and syncope.       Physical Exam Updated Vital Signs BP (!) 139/59   Pulse (!) 101   Temp (!) 102.4 F (39.1 C) (Oral)   Resp (!) 24   Ht 6\' 2"  (1.88 m)   Wt 127.9 kg (282 lb)   SpO2 92%   BMI 36.21 kg/m    Patient Vitals for the past 24 hrs:  BP Temp Temp src Pulse Resp SpO2 Height Weight  01/29/18 1400 (!) 116/59 - - 74 (!) 23 96 % - -  01/29/18 1345 132/61 98.1 F (36.7 C) Oral 82 - 96 % - -  01/29/18 1330 (!) 118/53 - - 80 - 96 % - -  01/29/18 1315 105/61 - - - - - - -  01/29/18 1305 - 99.6 F (37.6 C) Oral - - - - -  01/29/18 1300 (!) 120/55 - - 88 - 93 % - -  01/29/18 1230 (!)  121/54 - - 60 19 91 % - -  01/29/18 1221 (!) 139/59 (!) 102.4 F (39.1 C) Oral (!) 101 (!) 24 92 % - -  01/29/18 1221 (!) 139/56 (!) 102.4 F (39.1 C) Oral (!) 102 (!) 30 93 % - -  01/29/18 1218 - - - - - - 6\' 2"  (1.88 m) 127.9 kg (282 lb)     Physical Exam 1245: Physical examination:  Nursing notes reviewed; Vital signs and O2 SAT reviewed; +febrile.;; Constitutional: Well developed, Well nourished, In no acute distress; Head:  Normocephalic, atraumatic; Eyes: EOMI, PERRL, No scleral icterus; ENMT: Mouth and pharynx normal, Mucous membranes dry; Neck: Supple, Full range of motion, No lymphadenopathy; Cardiovascular: Tachycardic rate and rhythm, No gallop; Respiratory: Breath sounds clear & equal bilaterally, No wheezes.  Speaking full sentences with ease, Normal respiratory effort/excursion; Chest: Nontender, Movement normal; Abdomen: Soft, Nontender, Nondistended, Normal bowel sounds; Genitourinary: No CVA tenderness; Extremities: Peripheral pulses normal, No tenderness, No edema, No calf edema or asymmetry.; Neuro: AA&Ox3, Major CN grossly intact.  Speech clear. No gross focal motor or sensory deficits in extremities.; Skin: Color normal, Warm, Dry.   ED Treatments / Results  Labs (all labs ordered are listed, but only abnormal results are displayed)   EKG EKG Interpretation  Date/Time:  Tuesday January 29 2018 12:18:43 EDT Ventricular Rate:  100 PR Interval:    QRS Duration: 88 QT Interval:  374 QTC Calculation: 483 R Axis:   97 Text Interpretation:  Sinus tachycardia Probable inferior infarct, old Baseline wander in lead(s) II III aVF V4 V6 Since last tracing rate faster Confirmed by Daleen Bo [id number]) on 01/29/2018 12:23:35 PM   Radiology   Procedures Procedures (including critical care time)  Medications Ordered in ED Medications  0.9 %  sodium chloride infusion (has no administration in time range)  piperacillin-tazobactam (ZOSYN) IVPB 3.375 g (has no administration in  time range)  vancomycin (VANCOCIN) IVPB 1000 mg/200 mL premix (has no administration in time range)     Initial Impression / Assessment and Plan / ED Course  I have reviewed the triage vital signs and the nursing notes.  Pertinent labs & imaging results that were available during my care of the patient were reviewed by me and considered in my medical decision making (see chart for details).  MDM Reviewed: previous chart, nursing note and vitals Reviewed previous: labs and ECG Interpretation: labs, ECG and x-ray Total time providing critical care: 30-74 minutes. This excludes time spent performing separately reportable procedures and services. Consults: admitting MD and cardiology    CRITICAL CARE Performed by: Alfonzo Feller Total critical care time: 35 minutes Critical care time was exclusive of separately billable procedures and treating other patients. Critical care was necessary to treat or prevent imminent or life-threatening deterioration. Critical care was time spent personally by me on the following activities: development of treatment plan with patient and/or surrogate as well as nursing, discussions with consultants, evaluation of patient's response to treatment, examination of patient, obtaining history from patient or surrogate, ordering and performing treatments and interventions, ordering and review of laboratory studies, ordering and review of radiographic studies, pulse oximetry and re-evaluation of patient's condition.   Results for orders placed or performed during the hospital encounter of 01/29/18  Blood Culture (routine x 2)  Result Value Ref Range   Specimen Description BLOOD RIGHT FOREARM    Special Requests      BOTTLES DRAWN AEROBIC AND ANAEROBIC Blood Culture adequate volume Performed at [facility], [address] [place], [place] [address]    Culture PENDING    Report Status PENDING   Blood Culture (routine x 2)  Result Value Ref Range    Specimen Description BLOOD LEFT ARM    Special Requests      BOTTLES DRAWN AEROBIC AND ANAEROBIC Blood Culture adequate volume Performed at [facility], [address], [place], [place] [address]    Culture PENDING    Report Status PENDING   Protime-INR  Result Value Ref Range   Prothrombin Time 12.9 11.4 - 15.2 seconds  INR 0.98   Comprehensive metabolic panel  Result Value Ref Range   Sodium 136 135 - 145 mmol/L   Potassium 4.5 3.5 - 5.1 mmol/L   Chloride 105 98 - 111 mmol/L   CO2 22 22 - 32 mmol/L   Glucose, Bld 105 (H) 70 - 99 mg/dL   BUN 23 8 - 23 mg/dL   Creatinine, Ser 0.89 0.61 - 1.24 mg/dL   Calcium 9.0 8.9 - 10.3 mg/dL   Total Protein 6.8 6.5 - 8.1 g/dL   Albumin 3.9 3.5 - 5.0 g/dL   AST 25 15 - 41 U/L   ALT 28 0 - 44 U/L   Alkaline Phosphatase 67 38 - 126 U/L   Total Bilirubin 1.0 0.3 - 1.2 mg/dL   GFR calc non Af Amer >60 >60 mL/min   GFR calc Af Amer >60 >60 mL/min   Anion gap 9 5 - 15  CBC WITH DIFFERENTIAL  Result Value Ref Range   WBC 9.3 4.0 - 10.5 K/uL   RBC 4.30 4.22 - 5.81 MIL/uL   Hemoglobin 13.8 13.0 - 17.0 g/dL   HCT 40.6 39.0 - 52.0 %   MCV 94.4 78.0 - 100.0 fL   MCH 32.1 26.0 - 34.0 pg   MCHC 34.0 30.0 - 36.0 g/dL   RDW 13.9 11.5 - 15.5 %   Platelets 189 150 - 400 K/uL   Neutrophils Relative % 92 %   Neutro Abs 8.6 (H) 1.7 - 7.7 K/uL   Lymphocytes Relative 3 %   Lymphs Abs 0.3 (L) 0.7 - 4.0 K/uL   Monocytes Relative 4 %   Monocytes Absolute 0.4 0.1 - 1.0 K/uL   Eosinophils Relative 1 %   Eosinophils Absolute 0.1 0.0 - 0.7 K/uL   Basophils Relative 0 %   Basophils Absolute 0.0 0.0 - 0.1 K/uL  Urinalysis, Routine w reflex microscopic  Result Value Ref Range   Color, Urine YELLOW YELLOW   APPearance HAZY (A) CLEAR   Specific Gravity, Urine >1.030 (H) 1.005 - 1.030   pH 5.5 5.0 - 8.0   Glucose, UA NEGATIVE NEGATIVE mg/dL   Hgb urine dipstick TRACE (A) NEGATIVE   Bilirubin Urine NEGATIVE NEGATIVE   Ketones, ur TRACE (A) NEGATIVE mg/dL     Protein, ur 30 (A) NEGATIVE mg/dL   Nitrite POSITIVE (A) NEGATIVE   Leukocytes, UA MODERATE (A) NEGATIVE  Troponin I  Result Value Ref Range   Troponin I 0.13 (HH) <0.03 ng/mL  Urinalysis, Microscopic (reflex)  Result Value Ref Range   RBC / HPF 11-20 0 - 5 RBC/hpf   WBC, UA >50 0 - 5 WBC/hpf   Bacteria, UA MANY (A) NONE SEEN   Squamous Epithelial / LPF 11-20 0 - 5  I-Stat CG4 Lactic Acid, ED  (not at  [facility])  Result Value Ref Range   Lactic Acid, Venous 2.32 (HH) 0.5 - 1.9 mmol/L   Comment NOTIFIED PHYSICIAN    Dg Chest 2 View Result Date: 01/29/2018 CLINICAL DATA:  Cough and nausea.  Fever. EXAM: CHEST - 2 VIEW COMPARISON:  October 17, 2013 FINDINGS: There is no edema or consolidation. Heart is upper normal in size with pulmonary vascularity within normal limits. There is aortic atherosclerosis. No evident adenopathy. There is degenerative change in the thoracic spine. IMPRESSION: Aortic atherosclerosis.  No edema or consolidation. Aortic Atherosclerosis (ICD10-I70.0). Electronically Signed   By: Lowella Grip III M.D.   On: 01/29/2018 13:37    1415:  Code Sepsis called, protocol started.  IV abx started after [facility] and UC obtained. APAP already given by EMS PTA with improvement of fever. Troponin elevated, but EKG essentially unchanged from previous and pt denies CP or heavy exertional activity today. Doubt ACS, more likely demand. ASA given. T/C returned from [facility] Dr. Bronson Ing, case discussed, including:  HPI, pertinent PM/SHx, VS/PE, dx testing, ED course and treatment:  He has reviewed the chart, agrees troponin elevation likely demand d/t sepsis, continue ASA/statin/etc, no need for heparin at this time, admit medicine service.   1430:  Dx and testing, as well as d/w Cards MD, d/w pt and family.  Questions answered.  Verb understanding, agreeable to admit. T/C returned from [facility]. Roderic Palau, case discussed, including:  HPI, pertinent PM/SHx, VS/PE, dx testing, ED course and treatment,  d/w Cards MD:  Agreeable to admit.     Final Clinical Impressions(s) / ED Diagnoses   Final diagnoses:  None    ED Discharge Orders    None       Francine Graven, DO 01/31/18 1451

## 2018-01-29 NOTE — ED Notes (Signed)
Critical Result  01/29/2018 1311  CG4+  Result: 2.32  MD Notified: Thurnell Garbe

## 2018-01-29 NOTE — ED Triage Notes (Signed)
Spraying round up this morning x 2 hours, came inside and was nauseated and hot.

## 2018-01-29 NOTE — H&P (Signed)
History and Physical    Jerry Boyer:[id number] DOB: 06/22/1946 DOA: 01/29/2018  PCP: Redmond School, MD  Patient coming from: Home  I have personally briefly reviewed patient's old medical records in [place]  Chief Complaint: Fever  HPI: Jerry Boyer is a 72 y.o. male with medical history significant of tension, hyperlipidemia, BPH, reports having a cough for the past few days.  This is been nonproductive.  Today, he was out in his yard, spraying weeds.  Upon returning home, he felt increasingly weak, had chills, became diaphoretic.  He did not have any diarrhea, vomiting, chest pain.  He reports noticing some dysuria approximately 2 weeks ago, but that was self-limited and he has not had any since then.  Denies any hematuria.  Denies any flank pain.  ED Course: On arrival to the emergency room, he was noted to be febrile with temperature of 102.4 F.  Basic labs including CBC and chemistry were unrevealing.  Chest x-ray did not show any evidence of pneumonia.  Urinalysis indicated evidence of UTI.  Lactic acid was elevated at 3, but improved with hydration.  EKG did not show any acute changes, troponin mildly elevated at 0.13.  Review of Systems: As per HPI otherwise 10 point review of systems negative.    Past Medical History:  Diagnosis Date  . Anxiety   . Arthritis   . BPH (benign prostatic hyperplasia)   . Hypertension     Past Surgical History:  Procedure Laterality Date  . BACK SURGERY     lumbar disc  . CATARACT EXTRACTION W/PHACO Right 02/05/2017   Procedure: CATARACT EXTRACTION PHACO AND INTRAOCULAR LENS PLACEMENT (IOC);  Surgeon: Tonny Branch, MD;  Location: [facility];  Service: Ophthalmology;  Laterality: Right;  CDE: 7.36  . CATARACT EXTRACTION W/PHACO Left 02/19/2017   Procedure: CATARACT EXTRACTION PHACO AND INTRAOCULAR LENS PLACEMENT LEFT EYE;  Surgeon: Tonny Branch, MD;  Location: [facility];  Service: Ophthalmology;  Laterality: Left;  CDE: 7.98  . INGUINAL  HERNIA REPAIR Right 12/14/2017   Procedure: HERNIA REPAIR INGUINAL ADULT WITH MESH;  Surgeon: Aviva Signs, MD;  Location: [facility];  Service: General;  Laterality: Right;  . JOINT REPLACEMENT Left   . UMBILICAL HERNIA REPAIR N/A 12/14/2017   Procedure: HERNIA REPAIR UMBILICAL ADULT WITH MESH;  Surgeon: Aviva Signs, MD;  Location: [facility];  Service: General;  Laterality: N/A;     reports that he quit smoking about 17 years ago. His smoking use included cigarettes. He has a 20.00 pack-year smoking history. He has never used smokeless tobacco. He reports that he drinks about 12.6 oz of alcohol per week. He reports that he does not use drugs.  No Known Allergies  Family history: Family history reviewed and not pertinent  Prior to Admission medications   Medication Sig Start Date End Date Taking? Authorizing Provider  doxazosin (CARDURA) 4 MG tablet Take 4 mg by mouth at bedtime. 11/20/17  Yes [provider]  aspirin EC 81 MG tablet Take 81 mg by mouth daily.    [provider]  atorvastatin (LIPITOR) 40 MG tablet Take 40 mg by mouth daily.    [provider]  b complex vitamins tablet Take 1 tablet by mouth daily.    [provider]  Bilberry 150 MG CAPS Take 150 mg by mouth daily.    [provider]  citalopram (CELEXA) 40 MG tablet Take 40 mg by mouth daily.    [provider]  Coenzyme Q10 (  COQ10) 100 MG CAPS Take 100 mg by mouth daily.    [provider]  ezetimibe (ZETIA) 10 MG tablet Take 10 mg by mouth daily. 11/29/17   [provider]  lisinopril (PRINIVIL,ZESTRIL) 20 MG tablet Take 20 mg by mouth daily.    [provider]  meloxicam (MOBIC) 7.5 MG tablet Take 7.5 mg by mouth 2 (two) times daily.    [provider]  Multiple Vitamins-Minerals (CENTRUM SILVER ADULT 50+ PO) Take 1 tablet by mouth daily.    [provider]  Omega-3 Fatty Acids (OMEGA-3 FISH OIL PO) Take 1 capsule by mouth daily.     [provider]  vitamin C (ASCORBIC ACID) 500 MG tablet Take 500 mg by mouth daily.    [provider]    Physical Exam: Vitals:   01/29/18 1430 01/29/18 1445 01/29/18 1519 01/29/18 1524  BP: (!) 131/57 132/60 (!) 114/54   Pulse: 72 71 66   Resp: 18 20 18    Temp:   98.8 F (37.1 C)   TempSrc:   Oral   SpO2: 93% 93% 95%   Weight:    125.8 kg (277 lb 4.8 oz)  Height:    5\' 8"  (1.727 m)    Constitutional: NAD, calm, comfortable Vitals:   01/29/18 1430 01/29/18 1445 01/29/18 1519 01/29/18 1524  BP: (!) 131/57 132/60 (!) 114/54   Pulse: 72 71 66   Resp: 18 20 18    Temp:   98.8 F (37.1 C)   TempSrc:   Oral   SpO2: 93% 93% 95%   Weight:    125.8 kg (277 lb 4.8 oz)  Height:    5\' 8"  (1.727 m)   Eyes: PERRL, lids and conjunctivae normal ENMT: Mucous membranes are moist. Posterior pharynx clear of any exudate or lesions.Normal dentition.  Neck: normal, supple, no masses, no thyromegaly Respiratory: clear to auscultation bilaterally, no wheezing, no crackles. Normal respiratory effort. No accessory muscle use.  Cardiovascular: Regular rate and rhythm, no murmurs / rubs / gallops. No extremity edema. 2+ pedal pulses. No carotid bruits.  Abdomen: no tenderness, no masses palpated. No hepatosplenomegaly. Bowel sounds positive.  Musculoskeletal: no clubbing / cyanosis. No joint deformity upper and lower extremities. Good ROM, no contractures. Normal muscle tone.  Skin: no rashes, lesions, ulcers. No induration Neurologic: CN 2-12 grossly intact. Sensation intact, DTR normal. Strength 5/5 in all 4.  Psychiatric: Normal judgment and insight. Alert and oriented x 3. Normal mood.    Labs on Admission: I have personally reviewed following labs and imaging studies  CBC: Recent Labs  Lab 01/29/18 1305  WBC 9.3  NEUTROABS 8.6*  HGB 13.8  HCT 40.6  MCV 94.4  PLT 696   Basic Metabolic Panel: Recent Labs  Lab 01/29/18 1305  NA 136  K 4.5  CL 105  CO2 22    GLUCOSE 105*  BUN 23  CREATININE 0.89  CALCIUM 9.0   GFR: Estimated Creatinine Clearance: 95.6 mL/min (by C-G formula based on SCr of 0.89 mg/dL). Liver Function Tests: Recent Labs  Lab 01/29/18 1305  AST 25  ALT 28  ALKPHOS 67  BILITOT 1.0  PROT 6.8  ALBUMIN 3.9   No results for input(s): LIPASE, AMYLASE in the last 168 hours. No results for input(s): AMMONIA in the last 168 hours. Coagulation Profile: Recent Labs  Lab 01/29/18 1305  INR 0.98   Cardiac Enzymes: Recent Labs  Lab 01/29/18 1301  TROPONINI 0.13*   BNP (last 3 results) No  results for input(s): PROBNP in the last 8760 hours. HbA1C: No results for input(s): HGBA1C in the last 72 hours. CBG: No results for input(s): GLUCAP in the last 168 hours. Lipid Profile: No results for input(s): CHOL, HDL, LDLCALC, TRIG, CHOLHDL, LDLDIRECT in the last 72 hours. Thyroid Function Tests: No results for input(s): TSH, T4TOTAL, FREET4, T3FREE, THYROIDAB in the last 72 hours. Anemia Panel: No results for input(s): VITAMINB12, FOLATE, FERRITIN, TIBC, IRON, RETICCTPCT in the last 72 hours. Urine analysis:    Component Value Date/Time   COLORURINE YELLOW 01/29/2018 1249   APPEARANCEUR HAZY (A) 01/29/2018 1249   LABSPEC >1.030 (H) 01/29/2018 1249   PHURINE 5.5 01/29/2018 1249   GLUCOSEU NEGATIVE 01/29/2018 1249   HGBUR TRACE (A) 01/29/2018 1249   BILIRUBINUR NEGATIVE 01/29/2018 1249   KETONESUR TRACE (A) 01/29/2018 1249   PROTEINUR 30 (A) 01/29/2018 1249   UROBILINOGEN 1.0 03/06/2009 1205   NITRITE POSITIVE (A) 01/29/2018 1249   LEUKOCYTESUR MODERATE (A) 01/29/2018 1249    Radiological Exams on Admission: Dg Chest 2 View  Result Date: 01/29/2018 CLINICAL DATA:  Cough and nausea.  Fever. EXAM: CHEST - 2 VIEW COMPARISON:  October 17, 2013 FINDINGS: There is no edema or consolidation. Heart is upper normal in size with pulmonary vascularity within normal limits. There is aortic atherosclerosis. No evident adenopathy.  There is degenerative change in the thoracic spine. IMPRESSION: Aortic atherosclerosis.  No edema or consolidation. Aortic Atherosclerosis (ICD10-I70.0). Electronically Signed   By: Lowella Grip III M.D.   On: 01/29/2018 13:37    EKG: Independently reviewed.  Sinus rhythm without acute changes  Assessment/Plan Active Problems:   Umbilical hernia without obstruction and without gangrene   Sepsis (HCC)   Acute lower UTI   HLD (hyperlipidemia)   HTN (hypertension)   Elevated troponin     1. Sepsis.  Related to urinary tract infection.  Lactic acid trending down with IV hydration.  Blood cultures been sent.  Continue antibiotics.  Hemodynamics currently stable. 2. UTI.  Continue Rocephin.  Follow-up urine culture. 3. Elevated troponin.  Suspect demand ischemia in the setting of sepsis.  Continue to cycle troponins.  No evidence of chest pain at this time.  No EKG changes.  Continue aspirin 4. Hypertension.  Patient is chronically on lisinopril.  Will hold this for now in the setting of sepsis. 5. Hyperlipidemia.  Continue statin  DVT prophylaxis: Lovenox Code Status: Full code Family Communication: No family present Disposition Plan: Discharge home once improved Consults called:   Admission status: Observation, telemetry   Kathie Dike MD [facility] Pager [phone]  If 7PM-7AM, please contact night-coverage [email or web address] Password [email or web address]  01/29/2018, 5:07 PM

## 2018-01-29 NOTE — Progress Notes (Signed)
Pharmacy Antibiotic Note  Jerry Boyer is a 72 y.o. male admitted on 01/29/2018 with sepsis.  Pharmacy has been consulted for Vancomycin and zosyn dosing.  Plan: Vancomycin 2000mg  loading dose, then 1000mg  IV every 12 hours.  Goal trough 15-20 mcg/mL. Zosyn 3.375g IV q8h (4 hour infusion).  F/U cxs and clinical progress Monitor V/S, labs, and levels as indicated  Height: 5\' 8"  (172.7 cm) Weight: 277 lb 4.8 oz (125.8 kg) IBW/kg (Calculated) : 68.4  Temp (24hrs), Avg:100.3 F (37.9 C), Min:98.1 F (36.7 C), Max:102.4 F (39.1 C)  Recent Labs  Lab 01/29/18 1305 01/29/18 1306 01/29/18 1501  WBC 9.3  --   --   CREATININE 0.89  --   --   LATICACIDVEN  --  2.32* 1.7    Estimated Creatinine Clearance: 95.6 mL/min (by C-G formula based on SCr of 0.89 mg/dL).   Normalized CrCl is 59mls/min  No Known Allergies  Antimicrobials this admission: Vancomycin 7/2 >>  Zosyn 7/2 >>   Dose adjustments this admission: n/a  Microbiology results: 7/2 BCx: pending  UCx:  MRSA PCR:   Thank you for allowing pharmacy to be a part of this patient's care.  Isac Sarna, BS [place], [place] Clinical Pharmacist Pager [phone] 01/29/2018 4:43 PM

## 2018-01-30 ENCOUNTER — Encounter (HOSPITAL_COMMUNITY): Payer: Self-pay | Admitting: Student

## 2018-01-30 ENCOUNTER — Observation Stay (HOSPITAL_BASED_OUTPATIENT_CLINIC_OR_DEPARTMENT_OTHER): Payer: Medicare HMO

## 2018-01-30 DIAGNOSIS — Z79899 Other long term (current) drug therapy: Secondary | ICD-10-CM | POA: Diagnosis not present

## 2018-01-30 DIAGNOSIS — Z961 Presence of intraocular lens: Secondary | ICD-10-CM | POA: Diagnosis present

## 2018-01-30 DIAGNOSIS — I214 Non-ST elevation (NSTEMI) myocardial infarction: Secondary | ICD-10-CM | POA: Diagnosis present

## 2018-01-30 DIAGNOSIS — Z9842 Cataract extraction status, left eye: Secondary | ICD-10-CM | POA: Diagnosis not present

## 2018-01-30 DIAGNOSIS — I313 Pericardial effusion (noninflammatory): Secondary | ICD-10-CM

## 2018-01-30 DIAGNOSIS — A419 Sepsis, unspecified organism: Secondary | ICD-10-CM | POA: Diagnosis present

## 2018-01-30 DIAGNOSIS — F419 Anxiety disorder, unspecified: Secondary | ICD-10-CM | POA: Diagnosis present

## 2018-01-30 DIAGNOSIS — N390 Urinary tract infection, site not specified: Secondary | ICD-10-CM

## 2018-01-30 DIAGNOSIS — I10 Essential (primary) hypertension: Secondary | ICD-10-CM | POA: Diagnosis present

## 2018-01-30 DIAGNOSIS — E785 Hyperlipidemia, unspecified: Secondary | ICD-10-CM | POA: Diagnosis present

## 2018-01-30 DIAGNOSIS — Z87891 Personal history of nicotine dependence: Secondary | ICD-10-CM | POA: Diagnosis not present

## 2018-01-30 DIAGNOSIS — N400 Enlarged prostate without lower urinary tract symptoms: Secondary | ICD-10-CM | POA: Diagnosis present

## 2018-01-30 DIAGNOSIS — K429 Umbilical hernia without obstruction or gangrene: Secondary | ICD-10-CM | POA: Diagnosis present

## 2018-01-30 DIAGNOSIS — R748 Abnormal levels of other serum enzymes: Secondary | ICD-10-CM | POA: Diagnosis present

## 2018-01-30 DIAGNOSIS — R778 Other specified abnormalities of plasma proteins: Secondary | ICD-10-CM

## 2018-01-30 DIAGNOSIS — I248 Other forms of acute ischemic heart disease: Secondary | ICD-10-CM | POA: Diagnosis not present

## 2018-01-30 DIAGNOSIS — R110 Nausea: Secondary | ICD-10-CM | POA: Diagnosis present

## 2018-01-30 DIAGNOSIS — B961 Klebsiella pneumoniae [K. pneumoniae] as the cause of diseases classified elsewhere: Secondary | ICD-10-CM | POA: Diagnosis present

## 2018-01-30 DIAGNOSIS — Z7982 Long term (current) use of aspirin: Secondary | ICD-10-CM | POA: Diagnosis not present

## 2018-01-30 DIAGNOSIS — F329 Major depressive disorder, single episode, unspecified: Secondary | ICD-10-CM | POA: Diagnosis present

## 2018-01-30 DIAGNOSIS — R7989 Other specified abnormal findings of blood chemistry: Secondary | ICD-10-CM

## 2018-01-30 HISTORY — DX: Other specified abnormal findings of blood chemistry: R79.89

## 2018-01-30 HISTORY — DX: Other specified abnormalities of plasma proteins: R77.8

## 2018-01-30 LAB — COMPREHENSIVE METABOLIC PANEL
ALBUMIN: 3.2 g/dL — AB (ref 3.5–5.0)
ALK PHOS: 58 U/L (ref 38–126)
ALT: 23 U/L (ref 0–44)
ANION GAP: 6 (ref 5–15)
AST: 31 U/L (ref 15–41)
BILIRUBIN TOTAL: 1.1 mg/dL (ref 0.3–1.2)
BUN: 16 mg/dL (ref 8–23)
CO2: 23 mmol/L (ref 22–32)
Calcium: 8.2 mg/dL — ABNORMAL LOW (ref 8.9–10.3)
Chloride: 106 mmol/L (ref 98–111)
Creatinine, Ser: 0.76 mg/dL (ref 0.61–1.24)
GFR calc Af Amer: >60 mL/min (ref >60)
GLUCOSE: 120 mg/dL — AB (ref 70–99)
Potassium: 4.2 mmol/L (ref 3.5–5.1)
Sodium: 135 mmol/L (ref 135–145)
TOTAL PROTEIN: 5.9 g/dL — AB (ref 6.5–8.1)

## 2018-01-30 LAB — TROPONIN I
Troponin I: 0.37 ng/mL (ref <0.03)
Troponin I: 2.14 ng/mL (ref <0.03)
Troponin I: 0.98 ng/mL (ref <0.03)

## 2018-01-30 LAB — CBC
HCT: 37 % — ABNORMAL LOW (ref 39.0–52.0)
HEMOGLOBIN: 12 g/dL — AB (ref 13.0–17.0)
MCH: 31.3 pg (ref 26.0–34.0)
MCHC: 32.4 g/dL (ref 30.0–36.0)
MCV: 96.4 fL (ref 78.0–100.0)
Platelets: 180 10*3/uL (ref 150–400)
RBC: 3.84 MIL/uL — ABNORMAL LOW (ref 4.22–5.81)
RDW: 14.2 % (ref 11.5–15.5)
WBC: 9.2 10*3/uL (ref 4.0–10.5)

## 2018-01-30 LAB — ECHOCARDIOGRAM COMPLETE
HEIGHTINCHES: 68 in
WEIGHTICAEL: 4436.8 [oz_av]

## 2018-01-30 LAB — MAGNESIUM: MAGNESIUM: 2 mg/dL (ref 1.7–2.4)

## 2018-01-30 MED ORDER — ZOLPIDEM TARTRATE 5 MG PO TABS
5.0000 mg | ORAL_TABLET | Freq: Once | ORAL | Status: AC
Start: 2018-01-30 — End: 2018-01-30
  Administered 2018-01-30: 5 mg via ORAL
  Filled 2018-01-30: qty 1

## 2018-01-30 MED ORDER — ALPRAZOLAM 0.5 MG PO TABS
0.5000 mg | ORAL_TABLET | Freq: Every evening | ORAL | Status: DC | PRN
Start: 2018-01-30 — End: 2018-01-31
  Administered 2018-01-30: 0.5 mg via ORAL
  Filled 2018-01-30: qty 1

## 2018-01-30 MED ORDER — TRAZODONE HCL 50 MG PO TABS: 50.0000 mg | ORAL_TABLET | Freq: Every evening | ORAL | Status: DC | PRN

## 2018-01-30 NOTE — Care Management Obs Status (Signed)
[place] NOTIFICATION   Patient Details  Name: Jerry Boyer MRN: [id number] Date of Birth: 02/11/1946   Medicare Observation Status Notification Given:  Yes    Suzane Vanderweide, Chauncey Reading, RN 01/30/2018, 11:05 AM

## 2018-01-30 NOTE — Progress Notes (Signed)
PROGRESS NOTE    Jerry Boyer  HLK:[id number] DOB: [birth year] DOA: 01/29/2018 PCP: Redmond School, MD    Brief Narrative:  72 year old male admitted to the hospital with sepsis secondary to urinary tract infection.  Started on intravenous antibiotics and IV fluids.  Had a rising troponin to 2.1.  No EKG changes.  Suspect demand ischemia.  Cardiology following and further work-up in process.   Assessment & Plan:   Active Problems:   Umbilical hernia without obstruction and without gangrene   Sepsis ([place])   Acute lower UTI   HLD (hyperlipidemia)   HTN (hypertension)   Elevated troponin   1. Sepsis.  Related to urinary tract infection.  Lactic acid has improved with hydration.  Blood cultures have not shown any growth as of yet.  No further fever since admission.  Continue IV antibiotics. 2. UTI.  Continue on Rocephin.  Follow-up urine culture. 3. Elevated troponin.  Significant rise in troponin to 2.1 this morning.  No complaints of chest pain.  EKG does not show any acute changes.  Echocardiogram has been ordered.  Cardiology following.  Continue on aspirin. 4. Hypertension.  Patient is chronically on lisinopril.  This is being held in the setting of sepsis. 5. Hyperlipidemia.  Continue on statin.   DVT prophylaxis: Lovenox Code Status: Full code Family Communication: No family present Disposition Plan: Discharge home once improved   Consultants:   Cardiology  Procedures:     Antimicrobials:   Ceftriaxone 7/2 >   Subjective: No chest pain overnight.  No shortness of breath.  He did have some nausea.  Objective: Vitals:   01/29/18 2129 01/29/18 2232 01/30/18 0500 01/30/18 0814  BP: (!) 144/67  140/76 130/67  Pulse: (!) 55  66 64  Resp: 16  18 (!) 25  Temp: 98.6 F (37 C)  97.6 F (36.4 C) 98.5 F (36.9 C)  TempSrc: Oral  Oral Oral  SpO2: 97% 97% 98% 95%  Weight:      Height:        Intake/Output Summary (Last 24 hours) at 01/30/2018 1040 Last data  filed at 01/30/2018 0817 Gross per 24 hour  Intake 3417.92 ml  Output 800 ml  Net 2617.92 ml   Filed Weights   01/29/18 1218 01/29/18 1524  Weight: 127.9 kg (282 lb) 125.8 kg (277 lb 4.8 oz)    Examination:  General exam: Appears calm and comfortable  Respiratory system: Clear to auscultation. Respiratory effort normal. Cardiovascular system: S1 & S2 heard, RRR. No JVD, murmurs, rubs, gallops or clicks. No pedal edema. Gastrointestinal system: Abdomen is nondistended, soft and nontender. No organomegaly or masses felt. Normal bowel sounds heard. Central nervous system: Alert and oriented. No focal neurological deficits. Extremities: Symmetric 5 x 5 power. Skin: No rashes, lesions or ulcers Psychiatry: Judgement and insight appear normal. Mood & affect appropriate.     Data Reviewed: I have personally reviewed following labs and imaging studies  CBC: Recent Labs  Lab 01/29/18 1305 01/29/18 1747 01/30/18 0455  WBC 9.3 10.2 9.2  NEUTROABS 8.6*  --   --   HGB 13.8 12.6* 12.0*  HCT 40.6 38.0* 37.0*  MCV 94.4 95.5 96.4  PLT 189 173 734   Basic Metabolic Panel: Recent Labs  Lab 01/29/18 1305 01/29/18 1747 01/30/18 0455  NA 136  --  135  K 4.5  --  4.2  CL 105  --  106  CO2 22  --  23  GLUCOSE 105*  --  120*  BUN 23  --  16  CREATININE 0.89 0.96 0.76  CALCIUM 9.0  --  8.2*   GFR: Estimated Creatinine Clearance: 106.3 mL/min (by C-G formula based on SCr of 0.76 mg/dL). Liver Function Tests: Recent Labs  Lab 01/29/18 1305 01/30/18 0455  AST 25 31  ALT 28 23  ALKPHOS 67 58  BILITOT 1.0 1.1  PROT 6.8 5.9*  ALBUMIN 3.9 3.2*   No results for input(s): LIPASE, AMYLASE in the last 168 hours. No results for input(s): AMMONIA in the last 168 hours. Coagulation Profile: Recent Labs  Lab 01/29/18 1305  INR 0.98   Cardiac Enzymes: Recent Labs  Lab 01/29/18 1301 01/29/18 1747 01/29/18 2256 01/30/18 0455  TROPONINI 0.13* 0.43* 0.37* 2.14*   BNP (last 3  results) No results for input(s): PROBNP in the last 8760 hours. HbA1C: No results for input(s): HGBA1C in the last 72 hours. CBG: No results for input(s): GLUCAP in the last 168 hours. Lipid Profile: No results for input(s): CHOL, HDL, LDLCALC, TRIG, CHOLHDL, LDLDIRECT in the last 72 hours. Thyroid Function Tests: No results for input(s): TSH, T4TOTAL, FREET4, T3FREE, THYROIDAB in the last 72 hours. Anemia Panel: No results for input(s): VITAMINB12, FOLATE, FERRITIN, TIBC, IRON, RETICCTPCT in the last 72 hours. Sepsis Labs: Recent Labs  Lab 01/29/18 1306 01/29/18 1501  LATICACIDVEN 2.32* 1.7    Recent Results (from the past 240 hour(s))  Blood Culture (routine x 2)     Status: None (Preliminary result)   Collection Time: 01/29/18  1:05 PM  Result Value Ref Range Status   Specimen Description BLOOD RIGHT FOREARM  Final   Special Requests   Final    BOTTLES DRAWN AEROBIC AND ANAEROBIC Blood Culture adequate volume   Culture   Final    NO GROWTH < 24 HOURS Performed at [facility], [address], [place], [place] [address]    Report Status PENDING  Incomplete  Blood Culture (routine x 2)     Status: None (Preliminary result)   Collection Time: 01/29/18  1:05 PM  Result Value Ref Range Status   Specimen Description BLOOD LEFT ARM  Final   Special Requests   Final    BOTTLES DRAWN AEROBIC AND ANAEROBIC Blood Culture adequate volume   Culture   Final    NO GROWTH < 24 HOURS Performed at [facility], [address] [place], [place] [address]    Report Status PENDING  Incomplete         Radiology Studies: Dg Chest 2 View  Result Date: 01/29/2018 CLINICAL DATA:  Cough and nausea.  Fever. EXAM: CHEST - 2 VIEW COMPARISON:  October 17, 2013 FINDINGS: There is no edema or consolidation. Heart is upper normal in size with pulmonary vascularity within normal limits. There is aortic atherosclerosis. No evident adenopathy. There is degenerative change in the thoracic spine.  IMPRESSION: Aortic atherosclerosis.  No edema or consolidation. Aortic Atherosclerosis (ICD10-I70.0). Electronically Signed   By: Lowella Grip III M.D.   On: 01/29/2018 13:37        Scheduled Meds: . aspirin EC  81 mg Oral Daily  . atorvastatin  40 mg Oral Daily  . citalopram  40 mg Oral Daily  . doxazosin  4 mg Oral QHS  . enoxaparin (LOVENOX) injection  40 mg Subcutaneous Q24H  . ezetimibe  10 mg Oral Daily  . mouth rinse  15 mL Mouth Rinse BID   Continuous Infusions: . sodium chloride 125 mL/hr at 01/30/18 0920  . cefTRIAXone (ROCEPHIN)  IV Stopped (01/29/18 2220)     LOS: 0 days    Time spent: 46mins Greater than 50% of this time spent in direct contact with patient discussing management of sepsis and urinary tract infection as well as further work-up for elevated troponin and cardiology consultation    Kathie Dike, MD [facility] Pager [phone]  If 7PM-7AM, please contact night-coverage [email or web address] Password [email or web address] 01/30/2018, 10:40 AM

## 2018-01-30 NOTE — Consult Note (Addendum)
Cardiology Consult    Patient ID: Jerry Boyer; [id number]; 1945/11/06   Admit date: 01/29/2018 Date of Consult: 01/30/2018  Primary Care Provider: Redmond School, MD Primary Cardiologist: New to [facility] - Dr. Bronson Ing  Patient Profile    Jerry Boyer is a 72 y.o. male with past medical history of HTN, HLD and BPH who is being seen today for the evaluation of elevated troponin at the request of Dr. Roderic Palau.   History of Present Illness    [name] presented to [facility] ED on 01/29/2018 for evaluation of nausea, chills, and a worsening cough. Was febrile upon arrival with temperature at 102.4. While in the ED, initial labs showed WBC 9.3, Hgb 13.8, platelets 189, Na+ 136, K+ 4.5, and creatinine 0.89. Lactic acid was elevated to 2.32. UA was consistent with a UTI and urine culture is pending. Initial troponin was found to be elevated to 0.13 with subsequent values trending upwards to 0.43, 0.37, and 2.14. CXR shows aortic atherosclerosis with no edema or consolidation.  EKG shows normal sinus rhythm, HR 67, with 1st degree AV Block. No acute ST or T wave changes when compared to prior tracings from 2018.  CODE SEPSIS was activated while in the ED and he has been started on Vancomycin and Zosyn. Has been admitted for further management and treatment of Urosepsis with Cardiology having been consulted this morning for evaluation of elevated cardiac enzymes.   In talking with the patient today, he reports having been treated for a UTI as an outpatient several weeks ago and had overall been doing well until he developed a cough 2 days prior to his current admission. Starting yesterday, he developed subjective fever and chills with associated nausea which prompted him to come to the ED for further evaluation. Also reports frequent urination and urgency. He reports improvement in his symptoms since being started on antibiotic therapy. He does not exercise regularly but he is active at baseline in  performing yard work and denies any recent anginal symptoms with this. No recent chest pain, dyspnea on exertion, orthopnea, PND, or lower extremity edema.  He denies any known personal history of CAD or family history of CAD or cardiac issues. Reports his father passed away from cancer and his mother died of "natural causes".  He does have known HTN and HLD and says these have overall been well controlled with medications. He is a former smoker but quit 20+ years ago.  Does consume 1-2 beers per day. Denies any drug use.  Past Medical History:  Diagnosis Date  . Anxiety   . Arthritis   . BPH (benign prostatic hyperplasia)   . Hypertension     Past Surgical History:  Procedure Laterality Date  . BACK SURGERY     lumbar disc  . CATARACT EXTRACTION W/PHACO Right 02/05/2017   Procedure: CATARACT EXTRACTION PHACO AND INTRAOCULAR LENS PLACEMENT (IOC);  Surgeon: Tonny Branch, MD;  Location: [facility];  Service: Ophthalmology;  Laterality: Right;  CDE: 7.36  . CATARACT EXTRACTION W/PHACO Left 02/19/2017   Procedure: CATARACT EXTRACTION PHACO AND INTRAOCULAR LENS PLACEMENT LEFT EYE;  Surgeon: Tonny Branch, MD;  Location: [facility];  Service: Ophthalmology;  Laterality: Left;  CDE: 7.98  . INGUINAL HERNIA REPAIR Right 12/14/2017   Procedure: HERNIA REPAIR INGUINAL ADULT WITH MESH;  Surgeon: Aviva Signs, MD;  Location: [facility];  Service: General;  Laterality: Right;  . JOINT REPLACEMENT Left   . UMBILICAL HERNIA REPAIR N/A 12/14/2017   Procedure: HERNIA  REPAIR UMBILICAL ADULT WITH MESH;  Surgeon: Aviva Signs, MD;  Location: [facility];  Service: General;  Laterality: N/A;     Home Medications:  Prior to Admission medications   Medication Sig Start Date End Date Taking? Authorizing Provider  doxazosin (CARDURA) 4 MG tablet Take 4 mg by mouth at bedtime. 11/20/17  Yes [provider]  aspirin EC 81 MG tablet Take 81 mg by mouth daily.    [provider]  atorvastatin (LIPITOR) 40 MG tablet  Take 40 mg by mouth daily.    [provider]  b complex vitamins tablet Take 1 tablet by mouth daily.    [provider]  Bilberry 150 MG CAPS Take 150 mg by mouth daily.    [provider]  citalopram (CELEXA) 40 MG tablet Take 40 mg by mouth daily.    [provider]  Coenzyme Q10 (COQ10) 100 MG CAPS Take 100 mg by mouth daily.    [provider]  ezetimibe (ZETIA) 10 MG tablet Take 10 mg by mouth daily. 11/29/17   [provider]  lisinopril (PRINIVIL,ZESTRIL) 20 MG tablet Take 20 mg by mouth daily.    [provider]  meloxicam (MOBIC) 7.5 MG tablet Take 7.5 mg by mouth 2 (two) times daily.    [provider]  Multiple Vitamins-Minerals (CENTRUM SILVER ADULT 50+ PO) Take 1 tablet by mouth daily.    [provider]  Omega-3 Fatty Acids (OMEGA-3 FISH OIL PO) Take 1 capsule by mouth daily.    [provider]  vitamin C (ASCORBIC ACID) 500 MG tablet Take 500 mg by mouth daily.    [provider]    Inpatient Medications: Scheduled Meds: . aspirin EC  81 mg Oral Daily  . atorvastatin  40 mg Oral Daily  . citalopram  40 mg Oral Daily  . doxazosin  4 mg Oral QHS  . enoxaparin (LOVENOX) injection  40 mg Subcutaneous Q24H  . ezetimibe  10 mg Oral Daily  . mouth rinse  15 mL Mouth Rinse BID   Continuous Infusions: . sodium chloride 125 mL/hr at 01/30/18 0920  . cefTRIAXone (ROCEPHIN)  IV Stopped (01/29/18 2220)   PRN Meds: acetaminophen **OR** acetaminophen, ALPRAZolam, ondansetron **OR** ondansetron (ZOFRAN) IV  Allergies:   No Known Allergies  Social History:   Social History   Socioeconomic History  . Marital status: Single    Spouse name: Not on file  . Number of children: Not on file  . Years of education: Not on file  . Highest education level: Not on file  Occupational History  . Not on file  Social Needs  . Financial resource strain: Not on file  . Food insecurity:     Worry: Not on file    Inability: Not on file  . Transportation needs:    Medical: Not on file    Non-medical: Not on file  Tobacco Use  . Smoking status: Former Smoker    Packs/day: 1.00    Years: 20.00    Pack years: 20.00    Types: Cigarettes    Last attempt to quit: 01/29/2001    Years since quitting: 17.0  . Smokeless tobacco: Never Used  Substance and Sexual Activity  . Alcohol use: Yes    Alcohol/week: 12.6 oz    Types: 21 Cans of beer per week    Comment: states "drinks beer about every day"  . Drug use: No  . Sexual activity: Yes    Birth control/protection:  None  Lifestyle  . Physical activity:    Days per week: Not on file    Minutes per session: Not on file  . Stress: Not on file  Relationships  . Social connections:    Talks on phone: Not on file    Gets together: Not on file    Attends religious service: Not on file    Active member of club or organization: Not on file    Attends meetings of clubs or organizations: Not on file    Relationship status: Not on file  . Intimate partner violence:    Fear of current or ex partner: Not on file    Emotionally abused: Not on file    Physically abused: Not on file    Forced sexual activity: Not on file  Other Topics Concern  . Not on file  Social History Narrative  . Not on file     Family History:    Family History  Problem Relation Age of Onset  . Hypertension Mother   . Cancer Father       Review of Systems    General:  No night sweats or weight changes. Positive for fever and chills.  Cardiovascular:  No chest pain, dyspnea on exertion, edema, orthopnea, palpitations, paroxysmal nocturnal dyspnea. Dermatological: No rash, lesions/masses Respiratory: No cough, dyspnea Urologic: No hematuria, Positive for dysuria. Abdominal:   No vomiting, diarrhea, bright red blood per rectum, melena, or hematemesis. Positive for nausea.  Neurologic:  No visual changes, wkns, changes in mental status. All other  systems reviewed and are otherwise negative except as noted above.  Physical Exam/Data    Vitals:   01/29/18 2129 01/29/18 2232 01/30/18 0500 01/30/18 0814  BP: (!) 144/67  140/76 130/67  Pulse: (!) 55  66 64  Resp: 16  18 (!) 25  Temp: 98.6 F (37 C)  97.6 F (36.4 C) 98.5 F (36.9 C)  TempSrc: Oral  Oral Oral  SpO2: 97% 97% 98% 95%  Weight:      Height:        Intake/Output Summary (Last 24 hours) at 01/30/2018 1012 Last data filed at 01/30/2018 0817 Gross per 24 hour  Intake 3417.92 ml  Output 800 ml  Net 2617.92 ml   Filed Weights   01/29/18 1218 01/29/18 1524  Weight: 282 lb (127.9 kg) 277 lb 4.8 oz (125.8 kg)   Body mass index is 42.16 kg/m.   General: Pleasant, obese Caucasian male appearing in NAD Psych: Normal affect. Neuro: Alert and oriented X 3. Moves all extremities spontaneously. HEENT: Normal  Neck: Supple without bruits or JVD. Lungs:  Resp regular and unlabored, CTA without wheezing or rales. Heart: RRR no s3, s4, or murmurs. Abdomen: Soft, non-tender, non-distended, BS + x 4.  Extremities: No clubbing, cyanosis or edema. DP/PT/Radials 2+ and equal bilaterally.   EKG:  The EKG was personally reviewed and demonstrates: NSR, HR 67, with 1st degree AV Block. No acute ST or T wave changes when compared to prior tracings from 2018.   Labs/Studies     Relevant CV Studies:  Echocardiogram: Pending  Laboratory Data:  Chemistry Recent Labs  Lab 01/29/18 1305 01/29/18 1747 01/30/18 0455  NA 136  --  135  K 4.5  --  4.2  CL 105  --  106  CO2 22  --  23  GLUCOSE 105*  --  120*  BUN 23  --  16  CREATININE 0.89 0.96 0.76  CALCIUM 9.0  --  8.2*  GFRNONAA >60 >60 >60  GFRAA >60 >60 >60  ANIONGAP 9  --  6    Recent Labs  Lab 01/29/18 1305 01/30/18 0455  PROT 6.8 5.9*  ALBUMIN 3.9 3.2*  AST 25 31  ALT 28 23  ALKPHOS 67 58  BILITOT 1.0 1.1   Hematology Recent Labs  Lab 01/29/18 1305 01/29/18 1747 01/30/18 0455  WBC 9.3 10.2 9.2    RBC 4.30 3.98* 3.84*  HGB 13.8 12.6* 12.0*  HCT 40.6 38.0* 37.0*  MCV 94.4 95.5 96.4  MCH 32.1 31.7 31.3  MCHC 34.0 33.2 32.4  RDW 13.9 13.9 14.2  PLT 189 173 180   Cardiac Enzymes Recent Labs  Lab 01/29/18 1301 01/29/18 1747 01/29/18 2256 01/30/18 0455  TROPONINI 0.13* 0.43* 0.37* 2.14*   No results for input(s): TROPIPOC in the last 168 hours.  BNPNo results for input(s): BNP, PROBNP in the last 168 hours.  DDimer No results for input(s): DDIMER in the last 168 hours.  Radiology/Studies:  Dg Chest 2 View  Result Date: 01/29/2018 CLINICAL DATA:  Cough and nausea.  Fever. EXAM: CHEST - 2 VIEW COMPARISON:  October 17, 2013 FINDINGS: There is no edema or consolidation. Heart is upper normal in size with pulmonary vascularity within normal limits. There is aortic atherosclerosis. No evident adenopathy. There is degenerative change in the thoracic spine. IMPRESSION: Aortic atherosclerosis.  No edema or consolidation. Aortic Atherosclerosis (ICD10-I70.0). Electronically Signed   By: Lowella Grip III M.D.   On: 01/29/2018 13:37     Assessment & Plan    1. Elevated Troponin - The patient presented for evaluation of dysuria, nausea, fever and chills and he is now admitted for treatment of Urosepsis. - Cyclic troponin values have been obtained and have trended upwards from 0.13 to 2.14 on most recent check. EKG shows no acute ischemic changes when compared to prior tracings.  - Upon interview, he denies any recent anginal symptoms and has been performing yard work daily without any chest pain or dyspnea on exertion. He does not have known CAD but does have cardiac risk factors including HTN, HLD, and prior tobacco use. Also consumes beer on a daily basis but has no known history of a cardiomyopathy.  - Enzyme elevation is likely secondary to demand ischemia in the setting of urosepsis. Would continue to trend until values have peaked. No indication to start Heparin at this time. Will  plan to obtain an echocardiogram to assess for structural abnormalities. If echo shows no acute findings, would not pursue further ischemic evaluation this admission. If EF is found to be reduced, would need to consider once he has improved from his acute illness. - continue ASA and statin therapy.    2. HTN - BP has overall been well controlled at 105/53 - 144/76 since admission. PTA Lisinopril currently held.  3. HLD - Followed by PCP. Remains on Atorvastatin 40 mg daily and Zetia 10 mg daily.  4. Urosepsis - febrile up to 102.4 on admission with Lactic Acid elevated to 2.32. UA consistent with UTI and urine culture is pending.  - initially started on Vancomycin and Zosyn and this has transitioned to Rocephin.  - per admitting team.    For questions or updates, please contact [place] Please consult [email or web address] for contact info under Cardiology/STEMI.  Signed, Erma Heritage, PA-C 01/30/2018, 10:12 AM Pager: [phone]  The patient was seen and examined, and I agree with the history, physical exam, assessment and plan as documented above,  with modifications as noted below. I have also personally reviewed all relevant documentation, old records, labs, and both radiographic and cardiovascular studies. I have also independently interpreted old and new ECG's.  Briefly, this is a 72 year old male with a history of hypertension and hyperlipidemia who has been hospitalized for urosepsis.  He was febrile at the time of ED presentation.  He also had lactic acidosis, 2.32.  Urinalysis was indicative of a urinary tract infection.  I initially spoke with Dr. Thurnell Garbe about a minimally elevated troponin of 0.13.  It subsequently increased to a peak of 2.14 at present.  I personally reviewed the ECG which demonstrates normal sinus rhythm with first-degree AV block and no significant ST segment or T wave abnormalities.  I reviewed the chest x-ray which showed aortic atherosclerosis with no  evidence of CHF or infection.  He was initially on intravenous vancomycin and Zosyn and is now on IV ceftriaxone.  Patient believes he had a UTI about 2 weeks ago that subsequently got worse.  He developed a cough about 2 days prior to admission and then developed fevers and chills with nausea yesterday.  He denies any exertional chest pain and shortness of breath.  He is quite a bit of yard work without any exercise limiting symptoms.  He has no known coronary disease nor family history of coronary disease.  He quit smoking over 20 years ago.  The echocardiogram was being performed at the time of my evaluation.  The apical four-chamber images demonstrate normal left ventricular systolic function and regional wall motion, LVEF 60 to 65%.  I have yet to review all images.  Troponin elevation is likely secondary to supply demand mismatch in the context of urosepsis, possibly suggestive of a type II non-STEMI.  While he has cardiovascular risk factors, he has no known coronary disease.  Left ventricular systolic function and regional wall motion upon quick echocardiographic image review appears normal, LVEF 60 to 65%.  I will continue aspirin and statin therapy.  I do not feel he requires an ischemic evaluation in the inpatient setting.  I can follow-up with him in the outpatient setting and determine if a stress test is necessary then.   Kate Sable, MD, [facility]  01/30/2018 11:49 AM

## 2018-01-30 NOTE — Progress Notes (Signed)
*  PRELIMINARY RESULTS* Echocardiogram 2D Echocardiogram has been performed.  Jerry Boyer 01/30/2018, 11:51 AM

## 2018-01-30 NOTE — Progress Notes (Signed)
CRITICAL VALUE ALERT  Critical Value: Troponin 2.14  Date & Time Notied:  01/30/18 0615  Provider Notified: Georgiann Mohs  Orders Received/Actions taken: no new orders at this time

## 2018-01-30 NOTE — Progress Notes (Signed)
New orders from MD on call  G. Lama, for cardio consult and stat EKG due to troponin 2.14.

## 2018-01-31 DIAGNOSIS — I214 Non-ST elevation (NSTEMI) myocardial infarction: Secondary | ICD-10-CM

## 2018-01-31 DIAGNOSIS — R748 Abnormal levels of other serum enzymes: Secondary | ICD-10-CM

## 2018-01-31 LAB — URINE CULTURE: Culture: 80000 — AB

## 2018-01-31 LAB — BASIC METABOLIC PANEL
Anion gap: 7 (ref 5–15)
BUN: 11 mg/dL (ref 8–23)
CALCIUM: 8.2 mg/dL — AB (ref 8.9–10.3)
CHLORIDE: 107 mmol/L (ref 98–111)
CO2: 24 mmol/L (ref 22–32)
CREATININE: 0.71 mg/dL (ref 0.61–1.24)
GFR calc non Af Amer: >60 mL/min (ref >60)
Glucose, Bld: 121 mg/dL — ABNORMAL HIGH (ref 70–99)
POTASSIUM: 3.9 mmol/L (ref 3.5–5.1)
SODIUM: 138 mmol/L (ref 135–145)

## 2018-01-31 LAB — TROPONIN I: Troponin I: 0.49 ng/mL (ref <0.03)

## 2018-01-31 LAB — CBC
HCT: 36.8 % — ABNORMAL LOW (ref 39.0–52.0)
Hemoglobin: 11.9 g/dL — ABNORMAL LOW (ref 13.0–17.0)
MCH: 31.1 pg (ref 26.0–34.0)
MCHC: 32.3 g/dL (ref 30.0–36.0)
MCV: 96.1 fL (ref 78.0–100.0)
PLATELETS: 175 10*3/uL (ref 150–400)
RBC: 3.83 MIL/uL — ABNORMAL LOW (ref 4.22–5.81)
RDW: 14.1 % (ref 11.5–15.5)
WBC: 6.5 10*3/uL (ref 4.0–10.5)

## 2018-01-31 MED ORDER — MELOXICAM 7.5 MG PO TABS: 7.5000 mg | ORAL_TABLET | Freq: Two times a day (BID) | ORAL | Status: DC | PRN

## 2018-01-31 MED ORDER — CEFDINIR 300 MG PO CAPS: 300.0000 mg | ORAL_CAPSULE | Freq: Two times a day (BID) | ORAL | 0 refills | Status: AC

## 2018-01-31 MED ORDER — CITALOPRAM HYDROBROMIDE 20 MG PO TABS: 20.0000 mg | ORAL_TABLET | Freq: Every day | ORAL | 0 refills | Status: DC

## 2018-01-31 NOTE — Discharge Instructions (Signed)
Follow with Primary MD  [employer], MD  and other consultant's as instructed your Hospitalist MD  Please get a complete blood count and chemistry panel checked by your Primary MD at your next visit, and again as instructed by your Primary MD.  Get Medicines reviewed and adjusted: Please take all your medications with you for your next visit with your Primary MD  Laboratory/radiological data: Please request your Primary MD to go over all hospital tests and procedure/radiological results at the follow up, please ask your Primary MD to get all Hospital records sent to his/her office.  In some cases, they will be blood work, cultures and biopsy results pending at the time of your discharge. Please request that your primary care M.D. follows up on these results.  Also Note the following: If you experience worsening of your admission symptoms, develop shortness of breath, life threatening emergency, suicidal or homicidal thoughts you must seek medical attention immediately by calling 911 or calling your MD immediately  if symptoms less severe.  You must read complete instructions/literature along with all the possible adverse reactions/side effects for all the Medicines you take and that have been prescribed to you. Take any new Medicines after you have completely understood and accpet all the possible adverse reactions/side effects.   Do not drive when taking Pain medications or sleeping medications (Benzodaizepines)  Do not take more than prescribed Pain, Sleep and Anxiety Medications. It is not advisable to combine anxiety,sleep and pain medications without talking with your primary care practitioner  Special Instructions: If you have smoked or chewed Tobacco  in the last 2 yrs please stop smoking, stop any regular Alcohol  and or any Recreational drug use.  Wear Seat belts while driving.  Please note: You were cared for by a hospitalist during your hospital stay. Once you are discharged,  your primary care physician will handle any further medical issues. Please note that NO REFILLS for any discharge medications will be authorized once you are discharged, as it is imperative that you return to your primary care physician (or establish a relationship with a primary care physician if you do not have one) for your post hospital discharge needs so that they can reassess your need for medications and monitor your lab values.      Urinary Tract Infection, Adult A urinary tract infection (UTI) is an infection of any part of the urinary tract. The urinary tract includes the:  Kidneys.  Ureters.  Bladder.  Urethra.  These organs make, store, and get rid of pee (urine) in the body. Follow these instructions at home:  Take over-the-counter and prescription medicines only as told by your doctor.  If you were prescribed an antibiotic medicine, take it as told by your doctor. Do not stop taking the antibiotic even if you start to feel better.  Avoid the following drinks: ? Alcohol. ? Caffeine. ? Tea. ? Carbonated drinks.  Drink enough fluid to keep your pee clear or pale yellow.  Keep all follow-up visits as told by your doctor. This is important.  Make sure to: ? Empty your bladder often and completely. Do not to hold pee for long periods of time. ? Empty your bladder before and after sex. ? Wipe from front to back after a bowel movement if you are male. Use each tissue one time when you wipe. Contact a doctor if:  You have back pain.  You have a fever.  You feel sick to your stomach (nauseous).  You throw  up (vomit).  Your symptoms do not get better after 3 days.  Your symptoms go away and then come back. Get help right away if:  You have very bad back pain.  You have very bad lower belly (abdominal) pain.  You are throwing up and cannot keep down any medicines or water. This information is not intended to replace advice given to you by your health care  provider. Make sure you discuss any questions you have with your health care provider. Document Released: 01/03/2008 Document Revised: 12/23/2015 Document Reviewed: 06/07/2015 Elsevier Interactive Patient Education  2018 [employer].   Sepsis, Adult Sepsis is a serious bodily reaction to an infection. The infection that causes sepsis may be from a bacteria, a virus, a fungus, or a parasite. Sepsis can result from an infection in any part of the body. Infections that commonly lead to sepsis include skin, lung, and urinary tract infections. Sepsis is a medical emergency that requires immediate treatment at the hospital. In severe cases, it can lead to septic shock. Shock can weaken the heart and cause blood pressure to drop. This can make the central nervous system and the body's organs to stop working. What are the causes? This condition is caused by a severe reaction to a bacterial, viral, fungal, or parasitic infection. The germs that most commonly lead to sepsis include:  Escherichia coli (E. coli).  Staphylococcus aureus (staph).  The most common infections that lead to sepsis include infections of:  The skin.  The lung (pneumonia).  The gut.  The kidneys (urinary tract infection).  What increases the risk? You are more likely to develop this condition if:  You have a weakened disease-fighting (immune) system.  You are 32 or older.  You are male.  You had surgery, or you have been hospitalized.  You have a catheter, breathing tube, or drainage tubes inserted into your body.  You are not getting enough nutrients from food (are malnourished).  You have other long-term (chronic) diseases, including: ? Cancer. ? AIDS. ? Liver disease. ? Lung disease. ? Diabetes.  You have severe burns or injuries.  You inject drugs.  You have heart valve problems.  What are the signs or symptoms? Symptoms of this condition may include:  Fever.  Chills or feeling very  cold.  Fast heart rate (tachycardia).  Rapid breathing (hyperventilation).  Shortness of breath.  Confusion or light-headedness.  Changes in skin color. Your skin may look blotchy, pale, or blue.  Cool, clammy skin or sweaty skin.  Skin rash.  Nausea and vomiting.  Urinating much less than usual.  How is this diagnosed? This condition is diagnosed based on:  Your symptoms.  Your medical history.  A physical exam.  Other tests may also be done to find out the cause of the infection and how severe the sepsis is. These tests may include:  Blood tests.  Urine tests.  Swabs from other areas of the body that may have an infection. These samples may be tested (cultured) to find out what type of bacteria is causing the infection.  Chest X-ray to check for pneumonia. Other imaging tests, such as a CT scan, may also be done.  Lumbar puncture. This is a procedure to remove a small amount of the fluid that surrounds the brain and spinal cord. The fluid is then examined for infection.  How is this treated? This condition is treated in a hospital with antibiotic medicines. You may also receive:  Fluids through an IV tube.  Oxygen and breathing assistance.  Kidney dialysis. This process cleans the blood if the kidneys have failed.  Surgery to remove infected tissue.  Medicines to increase your blood pressure.  Nutrients to correct imbalances in basic body function (metabolism). This may involve receiving important salts and minerals (electrolytes) through an IV and having your blood sugar level adjusted.  Steroid medicines to control your bodys reaction to the infection.  Follow these instructions at home: Medicines  Take over-the-counter and prescription medicines only as told by your health care provider.  If you were prescribed an antibiotic or anti-fungal medicine, take it as told by your health care provider. Do not stop taking the antibiotic or anti-fungal  medicine even if you start to feel better. Activity  Rest and gradually return to your normal activities. Ask your health care provider what activities are safe for you.  Try to set small, achievable goals each week, such as dressing yourself, bathing, or walking up stairs. It may take a while to rebuild your strength.  Try to exercise regularly, if you feel healthy enough to do so. Ask your health care provider what exercises are safe for you. General instructions  Drink enough fluid to keep your urine clear or pale yellow.  Eat a healthy, balanced diet. This includes plenty of fruits and vegetables, whole grains, and lowfat (lean) proteins. Ask your health care provider if you should avoid certain foods.  Keep all follow-up visits as told by your health care provider. This is important. Contact a health care provider if:  You do not feel like you are getting better or regaining strength.  You are having trouble coping with your recovery.  You frequently feel tired.  You feel worse or do not seem to get better after surgery.  You think you may have an infection after surgery. Get help right away if:  You have any symptoms of sepsis.  You have difficulty breathing.  You have a rapid or skipping heartbeat.  You become confused.  You have a high fever.  Your skin becomes blotchy, pale, or blue. These symptoms may represent a serious problem that is an emergency. Do not wait to see if the symptoms will go away. Get medical help right away. Call your local emergency services (911 in the [place] Summary  Sepsis is a medical emergency that requires immediate treatment at the hospital.  This condition is caused by a severe reaction to a bacterial, viral, fungal, or parasitic infection.  This condition is treated in a hospital with antibiotics. Treatment may also include IV fluids, breathing assistance, and kidney dialysis.  If you were prescribed an antibiotic or anti-fungal  medicine, take it as told by your health care provider. Do not stop taking the antibiotic or anti-fungal medicine even if you start to feel better. This information is not intended to replace advice given to you by your health care provider. Make sure you discuss any questions you have with your health care provider. Document Released: 04/15/2003 Document Revised: 06/20/2016 Document Reviewed: 06/20/2016 Elsevier Interactive Patient Education  [employer].

## 2018-01-31 NOTE — Progress Notes (Signed)
Patient states understanding of discharge instructions.  

## 2018-01-31 NOTE — Discharge Summary (Signed)
Physician Discharge Summary  Jerry Boyer UEA:[id number] DOB: [birth year] DOA: 01/29/2018  PCP: Redmond School, MD  Admit date: 01/29/2018 Discharge date: 01/31/2018  Admitted From: Home  Disposition: Home  Recommendations for Outpatient Follow-up:  1. Follow up with PCP in 1 weeks 2. Follow up with cardiology in 2-4 weeks 3. Please obtain BMP/CBC in one week  Discharge Condition: STABLE   CODE STATUS: FULL    Brief Hospitalization Summary: Please see all hospital notes, images, labs for full details of the hospitalization. HPI: Jerry Boyer is a 72 y.o. male with medical history significant of tension, hyperlipidemia, BPH, reports having a cough for the past few days.  This is been nonproductive.  Today, he was out in his yard, spraying weeds.  Upon returning home, he felt increasingly weak, had chills, became diaphoretic.  He did not have any diarrhea, vomiting, chest pain.  He reports noticing some dysuria approximately 2 weeks ago, but that was self-limited and he has not had any since then.  Denies any hematuria.  Denies any flank pain.  ED Course: On arrival to the emergency room, he was noted to be febrile with temperature of 102.4 F.  Basic labs including CBC and chemistry were unrevealing.  Chest x-ray did not show any evidence of pneumonia.  Urinalysis indicated evidence of UTI.  Lactic acid was elevated at 3, but improved with hydration.  EKG did not show any acute changes, troponin mildly elevated at 0.13.  1. Sepsis.  Resolved now.  Related to urinary tract infection.  Lactic acid has improved with hydration.  Blood cultures have not shown any growth as of yet.  No further fever since admission.  Treated with IV antibiotics. 2. Klebsiella UTI.  Treated with Rocephin.  Discharge home on oral cefdinir x 7 days.  3. Elevated troponin.  Peaked at 2.1 and now has trended down.  Pt was seen by cardiology team and no further ischemic workup was recommended.  Pt will follow up with  cardiology team outpatient.  No complaints of chest pain.  EKG does not show any acute changes.  Echocardiogram with preserved EF 60-65%, mild LVH, grade 2 DD with no regional wall abnormalities.  Cardiology following.  Continue on aspirin. 4. Hypertension.  Patient is chronically on lisinopril.  This is being held in the setting of sepsis. 5. Hyperlipidemia.  Continue on statin. 6. Depression - Pharmacy recommended reducing celexa to 20 mg daily due to patient's age.    DVT prophylaxis: Lovenox Code Status: Full code Family Communication: No family present Disposition Plan: Discharge home   Consultants:   Cardiology  Discharge Diagnoses:  Active Problems:   Umbilical hernia without obstruction and without gangrene   Sepsis ([place])   Acute lower UTI   HLD (hyperlipidemia)   HTN (hypertension)   Elevated troponin   NSTEMI (non-ST elevated myocardial infarction) [facility])  Discharge Instructions: Discharge Instructions    Call MD for:  extreme fatigue   Complete by:  As directed    Call MD for:  persistant dizziness or light-headedness   Complete by:  As directed    Call MD for:  persistant nausea and vomiting   Complete by:  As directed    Call MD for:  severe uncontrolled pain   Complete by:  As directed    Call MD for:  temperature >100.4   Complete by:  As directed    Diet - low sodium heart healthy   Complete by:  As directed    Increase  activity slowly   Complete by:  As directed      Allergies as of 01/31/2018   No Known Allergies     Medication List    STOP taking these medications   Naproxen Sodium 220 MG Caps     TAKE these medications   aspirin EC 81 MG tablet Take 81 mg by mouth daily.   atorvastatin 40 MG tablet Commonly known as:  LIPITOR Take 40 mg by mouth daily.   b complex vitamins tablet Take 1 tablet by mouth daily.   Bilberry 150 MG Caps Take 150 mg by mouth daily.   cefdinir 300 MG capsule Commonly known as:  OMNICEF Take 1 capsule  (300 mg total) by mouth 2 (two) times daily for 7 days.   CENTRUM SILVER ADULT 50+ PO Take 1 tablet by mouth daily.   citalopram 20 MG tablet Commonly known as:  CELEXA Take 1 tablet (20 mg total) by mouth daily. What changed:    medication strength  how much to take   CoQ10 100 MG Caps Take 100 mg by mouth daily.   doxazosin 4 MG tablet Commonly known as:  CARDURA Take 4 mg by mouth at bedtime.   ezetimibe 10 MG tablet Commonly known as:  ZETIA Take 10 mg by mouth daily.   HCA TRIPLE ANTIBIOTIC OINTMENT EX Apply 1 application topically as needed.   lisinopril 20 MG tablet Commonly known as:  PRINIVIL,ZESTRIL Take 20 mg by mouth daily.   meloxicam 7.5 MG tablet Commonly known as:  MOBIC Take 1 tablet (7.5 mg total) by mouth 2 (two) times daily as needed for pain. What changed:    when to take this  reasons to take this   OMEGA-3 FISH OIL PO Take 1 capsule by mouth daily.   vitamin C 500 MG tablet Commonly known as:  ASCORBIC ACID Take 500 mg by mouth daily.      Follow-up Information    Redmond School, MD. Schedule an appointment as soon as possible for a visit in 1 week(s).   Specialty:  Internal Medicine Contact information: [address] [place] [address] [phone]        Herminio Commons, MD. Schedule an appointment as soon as possible for a visit in 2 week(s).   Specialty:  Cardiology Why:  Hospital Follow Up  Contact information: [place] [address] [phone]          No Known Allergies Allergies as of 01/31/2018   No Known Allergies     Medication List    STOP taking these medications   Naproxen Sodium 220 MG Caps     TAKE these medications   aspirin EC 81 MG tablet Take 81 mg by mouth daily.   atorvastatin 40 MG tablet Commonly known as:  LIPITOR Take 40 mg by mouth daily.   b complex vitamins tablet Take 1 tablet by mouth daily.   Bilberry 150 MG Caps Take 150 mg by mouth  daily.   cefdinir 300 MG capsule Commonly known as:  OMNICEF Take 1 capsule (300 mg total) by mouth 2 (two) times daily for 7 days.   CENTRUM SILVER ADULT 50+ PO Take 1 tablet by mouth daily.   citalopram 20 MG tablet Commonly known as:  CELEXA Take 1 tablet (20 mg total) by mouth daily. What changed:    medication strength  how much to take   CoQ10 100 MG Caps Take 100 mg by mouth daily.   doxazosin  4 MG tablet Commonly known as:  CARDURA Take 4 mg by mouth at bedtime.   ezetimibe 10 MG tablet Commonly known as:  ZETIA Take 10 mg by mouth daily.   HCA TRIPLE ANTIBIOTIC OINTMENT EX Apply 1 application topically as needed.   lisinopril 20 MG tablet Commonly known as:  PRINIVIL,ZESTRIL Take 20 mg by mouth daily.   meloxicam 7.5 MG tablet Commonly known as:  MOBIC Take 1 tablet (7.5 mg total) by mouth 2 (two) times daily as needed for pain. What changed:    when to take this  reasons to take this   OMEGA-3 FISH OIL PO Take 1 capsule by mouth daily.   vitamin C 500 MG tablet Commonly known as:  ASCORBIC ACID Take 500 mg by mouth daily.       Procedures/Studies: Dg Chest 2 View  Result Date: 01/29/2018 CLINICAL DATA:  Cough and nausea.  Fever. EXAM: CHEST - 2 VIEW COMPARISON:  October 17, 2013 FINDINGS: There is no edema or consolidation. Heart is upper normal in size with pulmonary vascularity within normal limits. There is aortic atherosclerosis. No evident adenopathy. There is degenerative change in the thoracic spine. IMPRESSION: Aortic atherosclerosis.  No edema or consolidation. Aortic Atherosclerosis (ICD10-I70.0). Electronically Signed   By: Lowella Grip III M.D.   On: 01/29/2018 13:37      Subjective: Pt says that he feels a lot better.  He says that he really wants to go home.    Discharge Exam: Vitals:   01/30/18 2127 01/31/18 0642  BP: (!) 161/71 (!) 156/76  Pulse: 65 63  Resp: 15 20  Temp: 99.2 F (37.3 C) 98.5 F (36.9 C)  SpO2: 94%  97%   Vitals:   01/30/18 1425 01/30/18 2100 01/30/18 2127 01/31/18 0642  BP: (!) 158/68  (!) 161/71 (!) 156/76  Pulse: 68  65 63  Resp: 20  15 20   Temp: 98.1 F (36.7 C)  99.2 F (37.3 C) 98.5 F (36.9 C)  TempSrc:   Oral Oral  SpO2: 94% (!) 83% 94% 97%  Weight:      Height:        General: Pt is alert, awake, not in acute distress Cardiovascular: RRR, S1/S2 +, no rubs, no gallops Respiratory: CTA bilaterally, no wheezing, no rhonchi Abdominal: Soft, NT, ND, bowel sounds + Extremities: no edema, no cyanosis   The results of significant diagnostics from this hospitalization (including imaging, microbiology, ancillary and laboratory) are listed below for reference.     Microbiology: Recent Results (from the past 240 hour(s))  Urine culture     Status: Abnormal   Collection Time: 01/29/18 12:49 PM  Result Value Ref Range Status   Specimen Description   Final    URINE, RANDOM Performed at [facility], [address], [place], [place] [address]    Special Requests   Final    NONE Performed at [facility], [address] [place], [place] [address]    Culture 80,000 COLONIES/mL KLEBSIELLA OXYTOCA (A)  Final   Report Status 01/31/2018 FINAL  Final   Organism ID, Bacteria KLEBSIELLA OXYTOCA (A)  Final      Susceptibility   Klebsiella oxytoca - MIC*    AMPICILLIN >=32 RESISTANT Resistant     CEFAZOLIN 8 SENSITIVE Sensitive     CEFTRIAXONE <=1 SENSITIVE Sensitive     CIPROFLOXACIN <=0.25 SENSITIVE Sensitive     GENTAMICIN <=1 SENSITIVE Sensitive     IMIPENEM <=0.25 SENSITIVE Sensitive     NITROFURANTOIN 32  SENSITIVE Sensitive     TRIMETH/SULFA <=20 SENSITIVE Sensitive     AMPICILLIN/SULBACTAM 16 INTERMEDIATE Intermediate     PIP/TAZO <=4 SENSITIVE Sensitive     Extended ESBL NEGATIVE Sensitive     * 80,000 COLONIES/mL KLEBSIELLA OXYTOCA  Blood Culture (routine x 2)     Status: None (Preliminary result)   Collection Time: 01/29/18  1:05 PM  Result Value Ref Range Status    Specimen Description BLOOD RIGHT FOREARM  Final   Special Requests   Final    BOTTLES DRAWN AEROBIC AND ANAEROBIC Blood Culture adequate volume   Culture   Final    NO GROWTH 2 DAYS Performed at [facility], [address] [place], [place] [address]    Report Status PENDING  Incomplete  Blood Culture (routine x 2)     Status: None (Preliminary result)   Collection Time: 01/29/18  1:05 PM  Result Value Ref Range Status   Specimen Description BLOOD LEFT ARM  Final   Special Requests   Final    BOTTLES DRAWN AEROBIC AND ANAEROBIC Blood Culture adequate volume   Culture   Final    NO GROWTH 2 DAYS Performed at [facility], [address] [place],  [address]    Report Status PENDING  Incomplete     Labs: BNP (last 3 results) No results for input(s): BNP in the last 8760 hours. Basic Metabolic Panel: Recent Labs  Lab 01/29/18 1305 01/29/18 1747 01/30/18 0455 01/30/18 1516 01/31/18 0632  NA 136  --  135  --  138  K 4.5  --  4.2  --  3.9  CL 105  --  106  --  107  CO2 22  --  23  --  24  GLUCOSE 105*  --  120*  --  121*  BUN 23  --  16  --  11  CREATININE 0.89 0.96 0.76  --  0.71  CALCIUM 9.0  --  8.2*  --  8.2*  MG  --   --   --  2.0  --    Liver Function Tests: Recent Labs  Lab 01/29/18 1305 01/30/18 0455  AST 25 31  ALT 28 23  ALKPHOS 67 58  BILITOT 1.0 1.1  PROT 6.8 5.9*  ALBUMIN 3.9 3.2*   No results for input(s): LIPASE, AMYLASE in the last 168 hours. No results for input(s): AMMONIA in the last 168 hours. CBC: Recent Labs  Lab 01/29/18 1305 01/29/18 1747 01/30/18 0455 01/31/18 0632  WBC 9.3 10.2 9.2 6.5  NEUTROABS 8.6*  --   --   --   HGB 13.8 12.6* 12.0* 11.9*  HCT 40.6 38.0* 37.0* 36.8*  MCV 94.4 95.5 96.4 96.1  PLT 189 173 180 175   Cardiac Enzymes: Recent Labs  Lab 01/29/18 1747 01/29/18 2256 01/30/18 0455 01/30/18 1048 01/31/18 0632  TROPONINI 0.43* 0.37* 2.14* 0.98* 0.49*   BNP: Invalid input(s): POCBNP CBG: No  results for input(s): GLUCAP in the last 168 hours. D-Dimer No results for input(s): DDIMER in the last 72 hours. Hgb A1c No results for input(s): HGBA1C in the last 72 hours. Lipid Profile No results for input(s): CHOL, HDL, LDLCALC, TRIG, CHOLHDL, LDLDIRECT in the last 72 hours. Thyroid function studies No results for input(s): TSH, T4TOTAL, T3FREE, THYROIDAB in the last 72 hours.  Invalid input(s): FREET3 Anemia work up No results for input(s): VITAMINB12, FOLATE, FERRITIN, TIBC, IRON, RETICCTPCT in the last 72 hours. Urinalysis    Component Value Date/Time  COLORURINE YELLOW 01/29/2018 1249   APPEARANCEUR HAZY (A) 01/29/2018 1249   LABSPEC >1.030 (H) 01/29/2018 1249   PHURINE 5.5 01/29/2018 1249   GLUCOSEU NEGATIVE 01/29/2018 1249   HGBUR TRACE (A) 01/29/2018 1249   BILIRUBINUR NEGATIVE 01/29/2018 1249   KETONESUR TRACE (A) 01/29/2018 1249   PROTEINUR 30 (A) 01/29/2018 1249   UROBILINOGEN 1.0 03/06/2009 1205   NITRITE POSITIVE (A) 01/29/2018 1249   LEUKOCYTESUR MODERATE (A) 01/29/2018 1249   Sepsis Labs Invalid input(s): PROCALCITONIN,  WBC,  LACTICIDVEN Microbiology Recent Results (from the past 240 hour(s))  Urine culture     Status: Abnormal   Collection Time: 01/29/18 12:49 PM  Result Value Ref Range Status   Specimen Description   Final    URINE, RANDOM Performed at [facility], [address], [place], [place] [address]    Special Requests   Final    NONE Performed at [facility], [address], [place], [place] [address]    Culture 80,000 COLONIES/mL [place] (A)  Final   Report Status 01/31/2018 FINAL  Final   Organism ID, Bacteria KLEBSIELLA OXYTOCA (A)  Final      Susceptibility   Klebsiella oxytoca - MIC*    AMPICILLIN >=32 RESISTANT Resistant     CEFAZOLIN 8 SENSITIVE Sensitive     CEFTRIAXONE <=1 SENSITIVE Sensitive     CIPROFLOXACIN <=0.25 SENSITIVE Sensitive     GENTAMICIN <=1 SENSITIVE Sensitive     IMIPENEM <=0.25 SENSITIVE  Sensitive     NITROFURANTOIN 32 SENSITIVE Sensitive     TRIMETH/SULFA <=20 SENSITIVE Sensitive     AMPICILLIN/SULBACTAM 16 INTERMEDIATE Intermediate     PIP/TAZO <=4 SENSITIVE Sensitive     Extended ESBL NEGATIVE Sensitive     * 80,000 COLONIES/mL KLEBSIELLA OXYTOCA  Blood Culture (routine x 2)     Status: None (Preliminary result)   Collection Time: 01/29/18  1:05 PM  Result Value Ref Range Status   Specimen Description BLOOD RIGHT FOREARM  Final   Special Requests   Final    BOTTLES DRAWN AEROBIC AND ANAEROBIC Blood Culture adequate volume   Culture   Final    NO GROWTH 2 DAYS Performed at [facility], [address], [place], [place] [address]    Report Status PENDING  Incomplete  Blood Culture (routine x 2)     Status: None (Preliminary result)   Collection Time: 01/29/18  1:05 PM  Result Value Ref Range Status   Specimen Description BLOOD LEFT ARM  Final   Special Requests   Final    BOTTLES DRAWN AEROBIC AND ANAEROBIC Blood Culture adequate volume   Culture   Final    NO GROWTH 2 DAYS Performed at [facility], [address], [place], [place] [address]    Report Status PENDING  Incomplete   Time coordinating discharge: 33 minutes  SIGNED:  Irwin Brakeman, MD  [facility] 01/31/2018, 11:56 AM Pager [phone]  If 7PM-7AM, please contact night-coverage [email or web address] Password [email or web address]

## 2018-02-03 LAB — CULTURE, BLOOD (ROUTINE X 2)
CULTURE: NO GROWTH
Culture: NO GROWTH
SPECIAL REQUESTS: ADEQUATE
Special Requests: ADEQUATE

## 2018-02-06 ENCOUNTER — Emergency Department (HOSPITAL_COMMUNITY): Payer: Medicare HMO

## 2018-02-06 ENCOUNTER — Encounter (HOSPITAL_COMMUNITY): Payer: Self-pay | Admitting: Emergency Medicine

## 2018-02-06 ENCOUNTER — Emergency Department (HOSPITAL_COMMUNITY)
Admission: EM | Admit: 2018-02-06 | Discharge: 2018-02-06 | Disposition: A | Discharge status: Home / Self Care | Payer: Medicare HMO | Attending: Emergency Medicine | Admitting: Emergency Medicine

## 2018-02-06 ENCOUNTER — Other Ambulatory Visit: Payer: Self-pay

## 2018-02-06 DIAGNOSIS — R3129 Other microscopic hematuria: Secondary | ICD-10-CM | POA: Diagnosis not present

## 2018-02-06 DIAGNOSIS — I959 Hypotension, unspecified: Secondary | ICD-10-CM | POA: Diagnosis not present

## 2018-02-06 DIAGNOSIS — I10 Essential (primary) hypertension: Secondary | ICD-10-CM | POA: Diagnosis not present

## 2018-02-06 DIAGNOSIS — Z87891 Personal history of nicotine dependence: Secondary | ICD-10-CM | POA: Insufficient documentation

## 2018-02-06 DIAGNOSIS — R890 Abnormal level of enzymes in specimens from other organs, systems and tissues: Secondary | ICD-10-CM | POA: Diagnosis not present

## 2018-02-06 DIAGNOSIS — Z6838 Body mass index (BMI) 38.0-38.9, adult: Secondary | ICD-10-CM | POA: Diagnosis not present

## 2018-02-06 DIAGNOSIS — Z7982 Long term (current) use of aspirin: Secondary | ICD-10-CM | POA: Diagnosis not present

## 2018-02-06 DIAGNOSIS — Z79899 Other long term (current) drug therapy: Secondary | ICD-10-CM | POA: Diagnosis not present

## 2018-02-06 DIAGNOSIS — I252 Old myocardial infarction: Secondary | ICD-10-CM | POA: Insufficient documentation

## 2018-02-06 DIAGNOSIS — Z139 Encounter for screening, unspecified: Secondary | ICD-10-CM

## 2018-02-06 DIAGNOSIS — R61 Generalized hyperhidrosis: Secondary | ICD-10-CM | POA: Diagnosis not present

## 2018-02-06 DIAGNOSIS — R509 Fever, unspecified: Secondary | ICD-10-CM | POA: Diagnosis not present

## 2018-02-06 DIAGNOSIS — R300 Dysuria: Secondary | ICD-10-CM | POA: Diagnosis not present

## 2018-02-06 DIAGNOSIS — A419 Sepsis, unspecified organism: Secondary | ICD-10-CM | POA: Diagnosis not present

## 2018-02-06 DIAGNOSIS — N390 Urinary tract infection, site not specified: Secondary | ICD-10-CM | POA: Diagnosis not present

## 2018-02-06 LAB — CBC WITH DIFFERENTIAL/PLATELET
BASOS ABS: 0.1 10*3/uL (ref 0.0–0.1)
BASOS PCT: 1 %
Eosinophils Absolute: 0.2 10*3/uL (ref 0.0–0.7)
Eosinophils Relative: 3 %
HEMATOCRIT: 41 % (ref 39.0–52.0)
HEMOGLOBIN: 13.7 g/dL (ref 13.0–17.0)
Lymphocytes Relative: 33 %
Lymphs Abs: 1.9 10*3/uL (ref 0.7–4.0)
MCH: 31.6 pg (ref 26.0–34.0)
MCHC: 33.4 g/dL (ref 30.0–36.0)
MCV: 94.5 fL (ref 78.0–100.0)
Monocytes Absolute: 0.7 10*3/uL (ref 0.1–1.0)
Monocytes Relative: 12 %
NEUTROS ABS: 2.9 10*3/uL (ref 1.7–7.7)
NEUTROS PCT: 51 %
Platelets: 254 10*3/uL (ref 150–400)
RBC: 4.34 MIL/uL (ref 4.22–5.81)
RDW: 14 % (ref 11.5–15.5)
WBC: 5.7 10*3/uL (ref 4.0–10.5)

## 2018-02-06 LAB — COMPREHENSIVE METABOLIC PANEL
ALK PHOS: 68 U/L (ref 38–126)
ALT: 40 U/L (ref 0–44)
ANION GAP: 8 (ref 5–15)
AST: 28 U/L (ref 15–41)
Albumin: 3.7 g/dL (ref 3.5–5.0)
BILIRUBIN TOTAL: 0.6 mg/dL (ref 0.3–1.2)
BUN: 21 mg/dL (ref 8–23)
CALCIUM: 8.8 mg/dL — AB (ref 8.9–10.3)
CO2: 24 mmol/L (ref 22–32)
Chloride: 105 mmol/L (ref 98–111)
Creatinine, Ser: 1.14 mg/dL (ref 0.61–1.24)
GFR calc Af Amer: >60 mL/min (ref >60)
GFR calc non Af Amer: >60 mL/min (ref >60)
Glucose, Bld: 106 mg/dL — ABNORMAL HIGH (ref 70–99)
Potassium: 4.3 mmol/L (ref 3.5–5.1)
SODIUM: 137 mmol/L (ref 135–145)
TOTAL PROTEIN: 6.5 g/dL (ref 6.5–8.1)

## 2018-02-06 LAB — URINALYSIS, ROUTINE W REFLEX MICROSCOPIC
BACTERIA UA: NONE SEEN
BILIRUBIN URINE: NEGATIVE
Glucose, UA: NEGATIVE mg/dL
Hgb urine dipstick: NEGATIVE
KETONES UR: 5 mg/dL — AB
NITRITE: NEGATIVE
Protein, ur: NEGATIVE mg/dL
SPECIFIC GRAVITY, URINE: 1.021 (ref 1.005–1.030)
pH: 5 (ref 5.0–8.0)

## 2018-02-06 LAB — LACTIC ACID, PLASMA: Lactic Acid, Venous: 1.7 mmol/L (ref 0.5–1.9)

## 2018-02-06 MED ORDER — IOPAMIDOL (ISOVUE-300) INJECTION 61%
100.0000 mL | Freq: Once | INTRAVENOUS | Status: AC | PRN
  Administered 2018-02-06: 100 mL via INTRAVENOUS

## 2018-02-06 NOTE — ED Notes (Signed)
Rounding done in waiting area. Patient and family checked on. Patient in NAD. Delay explained.

## 2018-02-06 NOTE — ED Provider Notes (Signed)
[facility] EMERGENCY DEPARTMENT Provider Note   CSN: [id number] Arrival date & time: 02/06/18  1358     History   Chief Complaint Chief Complaint  Patient presents with  . Hypotension    HPI Jerry Boyer is a 72 y.o. male.  HPI  Pt was seen at [facility]. Per pt and his wife, c/o gradual onset and persistence of intermittent dysuria for the past 2 weeks. Pt states he was discharged from the hospital last week for dx UTI with rx cefdinir. Pt states he has one more dose to take.  Endorses intermittent symptoms of "burning" when he urinates since being discharged.  States his PMD told him today his "BP was low" and to come to the ED for further evaluation "for a continued urine infection" and "maybe I need for more antibiotics."  Pt denies any other symptoms. Denies objective fevers, no CP/SOB, no cough, no back/flank pain, no abd pain, no N/V/D, no rash, no testicular pain/swelling, no hematuria, no perineal pain.     Past Medical History:  Diagnosis Date  . Anxiety   . Arthritis   . BPH (benign prostatic hyperplasia)   . Hypertension   . NSTEMI (non-ST elevated myocardial infarction) ([place]) 01/30/2018    Patient Active Problem List   Diagnosis Date Noted  . NSTEMI (non-ST elevated myocardial infarction) ([place]) 01/30/2018  . Sepsis ([place]) 01/29/2018  . Acute lower UTI 01/29/2018  . HLD (hyperlipidemia) 01/29/2018  . HTN (hypertension) 01/29/2018  . Elevated troponin 01/29/2018  . Non-recurrent unilateral inguinal hernia without obstruction or gangrene   . Umbilical hernia without obstruction and without gangrene     Past Surgical History:  Procedure Laterality Date  . BACK SURGERY     lumbar disc  . CATARACT EXTRACTION W/PHACO Right 02/05/2017   Procedure: CATARACT EXTRACTION PHACO AND INTRAOCULAR LENS PLACEMENT (IOC);  Surgeon: Tonny Branch, MD;  Location: [facility];  Service: Ophthalmology;  Laterality: Right;  CDE: 7.36  . CATARACT EXTRACTION W/PHACO Left 02/19/2017   Procedure:  CATARACT EXTRACTION PHACO AND INTRAOCULAR LENS PLACEMENT LEFT EYE;  Surgeon: Tonny Branch, MD;  Location: [facility];  Service: Ophthalmology;  Laterality: Left;  CDE: 7.98  . INGUINAL HERNIA REPAIR Right 12/14/2017   Procedure: HERNIA REPAIR INGUINAL ADULT WITH MESH;  Surgeon: Aviva Signs, MD;  Location: [facility];  Service: General;  Laterality: Right;  . JOINT REPLACEMENT Left   . UMBILICAL HERNIA REPAIR N/A 12/14/2017   Procedure: HERNIA REPAIR UMBILICAL ADULT WITH MESH;  Surgeon: Aviva Signs, MD;  Location: [facility];  Service: General;  Laterality: N/A;        Home Medications    Prior to Admission medications   Medication Sig Start Date End Date Taking? Authorizing Provider  aspirin EC 81 MG tablet Take 81 mg by mouth daily.   Yes [provider]  atorvastatin (LIPITOR) 40 MG tablet Take 40 mg by mouth every morning.    Yes [provider]  b complex vitamins tablet Take 1 tablet by mouth daily.   Yes [provider]  Bilberry 150 MG CAPS Take 150 mg by mouth daily.   Yes [provider]  cefdinir (OMNICEF) 300 MG capsule Take 1 capsule (300 mg total) by mouth 2 (two) times daily for 7 days. 01/31/18 02/07/18 Yes Johnson, Clanford L, MD  citalopram (CELEXA) 20 MG tablet Take 1 tablet (20 mg total) by mouth daily. 01/31/18 03/02/18 Yes Johnson, Clanford L, MD  Coenzyme Q10 (COQ10) 100 MG CAPS Take 100 mg  by mouth daily.   Yes [provider]  doxazosin (CARDURA) 4 MG tablet Take 4 mg by mouth at bedtime. 11/20/17  Yes [provider]  ezetimibe (ZETIA) 10 MG tablet Take 10 mg by mouth daily. 11/29/17  Yes [provider]  lisinopril (PRINIVIL,ZESTRIL) 20 MG tablet Take 20 mg by mouth daily.   Yes [provider]  meloxicam (MOBIC) 7.5 MG tablet Take 1 tablet (7.5 mg total) by mouth 2 (two) times daily as needed for pain. 01/31/18  Yes Johnson, Clanford L, MD  Multiple Vitamins-Minerals (CENTRUM SILVER ADULT 50+ PO) Take 1 tablet by  mouth daily.   Yes [provider]  Omega-3 Fatty Acids (OMEGA-3 FISH OIL PO) Take 1 capsule by mouth daily.   Yes [provider]  vitamin C (ASCORBIC ACID) 500 MG tablet Take 500 mg by mouth daily.   Yes [provider]    Family History Family History  Problem Relation Age of Onset  . Hypertension Mother   . Cancer Father     Social History Social History   Tobacco Use  . Smoking status: Former Smoker    Packs/day: 1.00    Years: 20.00    Pack years: 20.00    Types: Cigarettes    Last attempt to quit: 01/29/2001    Years since quitting: 17.0  . Smokeless tobacco: Never Used  Substance Use Topics  . Alcohol use: Yes    Alcohol/week: 12.6 oz    Types: 21 Cans of beer per week    Comment: states "drinks beer about every day"  . Drug use: No     Allergies   Patient has no known allergies.   Review of Systems Review of Systems ROS: Statement: All systems negative except as marked or noted in the HPI; Constitutional: Negative for fever and chills. ; ; Eyes: Negative for eye pain, redness and discharge. ; ; ENMT: Negative for ear pain, hoarseness, nasal congestion, sinus pressure and sore throat. ; ; Cardiovascular: Negative for chest pain, palpitations, diaphoresis, dyspnea and peripheral edema. ; ; Respiratory: Negative for cough, wheezing and stridor. ; ; Gastrointestinal: Negative for nausea, vomiting, diarrhea, abdominal pain, blood in stool, hematemesis, jaundice and rectal bleeding. . ; ; Genitourinary: +intermittent dysuria. Negative for flank pain and hematuria. ; ; Genital:  No penile drainage or rash, no testicular pain or swelling, no scrotal rash or swelling. ;; Musculoskeletal: Negative for neck pain. Negative for swelling and trauma.; ; Skin: Negative for pruritus, rash, abrasions, blisters, bruising and skin lesion.; ; Neuro: Negative for headache, lightheadedness and neck stiffness. Negative for weakness, altered level of consciousness,  altered mental status, extremity weakness, paresthesias, involuntary movement, seizure and syncope.      Physical Exam Updated Vital Signs BP 139/65   Pulse (!) 58   Temp 97.7 F (36.5 C) (Oral)   Resp 18   Ht 5\' 8"  (1.727 m)   Wt 125.6 kg (277 lb)   SpO2 98%   BMI 42.12 kg/m    Patient Vitals for the past 24 hrs:  BP Temp Temp src Pulse Resp SpO2 Height Weight  02/06/18 1830 139/65 - - (!) 58 - 98 % - -  02/06/18 1823 - 97.7 F (36.5 C) Oral - - - - -  02/06/18 1800 121/61 - - (!) 55 - 98 % - -  02/06/18 1409 (!) 109/56 97.7 F (36.5 C) Temporal 66 18 96 % 5\' 8"  (1.727 m) 125.6 kg (277 lb)  18:18 Orthostatic Vital Signs AM  Orthostatic Lying   BP- Lying: 130/71   Pulse- Lying: 54       Orthostatic Sitting  BP- Sitting: 119/61   Pulse- Sitting: 62       Orthostatic Standing at 0 minutes  BP- Standing at 0 minutes: 120/57   Pulse- Standing at 0 minutes: 65      Physical Exam 1810: Physical examination:  Nursing notes reviewed; Vital signs and O2 SAT reviewed;  Constitutional: Well developed, Well nourished, Well hydrated, In no acute distress; Head:  Normocephalic, atraumatic; Eyes: EOMI, PERRL, No scleral icterus; ENMT: Mouth and pharynx normal, Mucous membranes moist; Neck: Supple, Full range of motion, No lymphadenopathy; Cardiovascular: Regular rate and rhythm, No gallop; Respiratory: Breath sounds clear & equal bilaterally, No wheezes.  Speaking full sentences with ease, Normal respiratory effort/excursion; Chest: Nontender, Movement normal; Abdomen: Soft, Nontender, Nondistended, Normal bowel sounds; Genitourinary: No CVA tenderness; Spine:  No midline CS, TS, LS tenderness. +very mild TTP left lumbar paraspinal muscles.;; Extremities: Peripheral pulses normal, No tenderness, No edema, No calf edema or asymmetry.; Neuro: AA&Ox3, Major CN grossly intact.  Speech clear. No gross focal motor or sensory deficits in extremities.; Skin: Color normal, Warm,  Dry.   ED Treatments / Results  Labs (all labs ordered are listed, but only abnormal results are displayed)   EKG None  Radiology   Procedures Procedures (including critical care time)  Medications Ordered in ED Medications - No data to display   Initial Impression / Assessment and Plan / ED Course  I have reviewed the triage vital signs and the nursing notes.  Pertinent labs & imaging results that were available during my care of the patient were reviewed by me and considered in my medical decision making (see chart for details).  MDM Reviewed: previous chart, nursing note and vitals Reviewed previous: labs Interpretation: labs and CT scan   Results for orders placed or performed during the hospital encounter of 02/06/18  Culture, blood (routine x 2)  Result Value Ref Range   Specimen Description BLOOD RIGHT ARM    Special Requests      BOTTLES DRAWN AEROBIC AND ANAEROBIC Blood Culture adequate volume Performed at [facility], [address], [place], [place] [address]    Culture PENDING    Report Status PENDING   Culture, blood (routine x 2)  Result Value Ref Range   Specimen Description RIGHT ANTECUBITAL    Special Requests      BOTTLES DRAWN AEROBIC AND ANAEROBIC Blood Culture adequate volume Performed at [facility], [address], [place], [place] [address]    Culture PENDING    Report Status PENDING   Urinalysis, Routine w reflex microscopic  Result Value Ref Range   Color, Urine AMBER (A) YELLOW   APPearance CLOUDY (A) CLEAR   Specific Gravity, Urine 1.021 1.005 - 1.030   pH 5.0 5.0 - 8.0   Glucose, UA NEGATIVE NEGATIVE mg/dL   Hgb urine dipstick NEGATIVE NEGATIVE   Bilirubin Urine NEGATIVE NEGATIVE   Ketones, ur 5 (A) NEGATIVE mg/dL   Protein, ur NEGATIVE NEGATIVE mg/dL   Nitrite NEGATIVE NEGATIVE   Leukocytes, UA TRACE (A) NEGATIVE   RBC / HPF 6-10 0 - 5 RBC/hpf   WBC, UA 0-5 0 - 5 WBC/hpf   Bacteria, UA NONE SEEN NONE SEEN   Squamous  Epithelial / LPF 0-5 0 - 5   Mucus PRESENT   Comprehensive metabolic panel  Result Value Ref Range   Sodium  137 135 - 145 mmol/L   Potassium 4.3 3.5 - 5.1 mmol/L   Chloride 105 98 - 111 mmol/L   CO2 24 22 - 32 mmol/L   Glucose, Bld 106 (H) 70 - 99 mg/dL   BUN 21 8 - 23 mg/dL   Creatinine, Ser 1.14 0.61 - 1.24 mg/dL   Calcium 8.8 (L) 8.9 - 10.3 mg/dL   Total Protein 6.5 6.5 - 8.1 g/dL   Albumin 3.7 3.5 - 5.0 g/dL   AST 28 15 - 41 U/L   ALT 40 0 - 44 U/L   Alkaline Phosphatase 68 38 - 126 U/L   Total Bilirubin 0.6 0.3 - 1.2 mg/dL   GFR calc non Af Amer >60 >60 mL/min   GFR calc Af Amer >60 >60 mL/min   Anion gap 8 5 - 15  CBC with Differential  Result Value Ref Range   WBC 5.7 4.0 - 10.5 K/uL   RBC 4.34 4.22 - 5.81 MIL/uL   Hemoglobin 13.7 13.0 - 17.0 g/dL   HCT 41.0 39.0 - 52.0 %   MCV 94.5 78.0 - 100.0 fL   MCH 31.6 26.0 - 34.0 pg   MCHC 33.4 30.0 - 36.0 g/dL   RDW 14.0 11.5 - 15.5 %   Platelets 254 150 - 400 K/uL   Neutrophils Relative % 51 %   Neutro Abs 2.9 1.7 - 7.7 K/uL   Lymphocytes Relative 33 %   Lymphs Abs 1.9 0.7 - 4.0 K/uL   Monocytes Relative 12 %   Monocytes Absolute 0.7 0.1 - 1.0 K/uL   Eosinophils Relative 3 %   Eosinophils Absolute 0.2 0.0 - 0.7 K/uL   Basophils Relative 1 %   Basophils Absolute 0.1 0.0 - 0.1 K/uL  Lactic acid, plasma  Result Value Ref Range   Lactic Acid, Venous 1.7 0.5 - 1.9 mmol/L    Ct Abdomen Pelvis W Contrast Result Date: 02/06/2018 CLINICAL DATA:  Dysuria, back pain. EXAM: CT ABDOMEN AND PELVIS WITH CONTRAST TECHNIQUE: Multidetector CT imaging of the abdomen and pelvis was performed using the standard protocol following bolus administration of intravenous contrast. CONTRAST:  163mL ISOVUE-300 IOPAMIDOL (ISOVUE-300) INJECTION 61% COMPARISON:  None. FINDINGS: Lower chest: No acute abnormality. Hepatobiliary: No focal liver abnormality is seen. No gallstones, gallbladder wall thickening, or biliary dilatation. Pancreas:  Unremarkable. No pancreatic ductal dilatation or surrounding inflammatory changes. Spleen: Normal in size without focal abnormality. Adrenals/Urinary Tract: Adrenal glands appear normal. Right renal cysts are noted. No hydronephrosis or renal obstruction is noted. No renal or ureteral calculi are noted. Urinary bladder is unremarkable. Stomach/Bowel: Stomach is within normal limits. Appendix appears normal. No evidence of bowel wall thickening, distention, or inflammatory changes. Vascular/Lymphatic: No significant vascular findings are present. No enlarged abdominal or pelvic lymph nodes. Reproductive: Prostate is unremarkable. Other: No abdominal wall hernia or abnormality. No abdominopelvic ascites. Musculoskeletal: No acute or significant osseous findings. IMPRESSION: No acute abnormality seen in the abdomen or pelvis. Electronically Signed   By: Marijo Conception, M.D.   On: 02/06/2018 20:43    2100:  Pt with equivocal BP on arrival, but has spontaneously improved while he has been in the ED. Pt is not orthostatic on VS. Pt remains afebrile with normal WBC count and lactic acid. Udip without clear infection, UC and BC are pending. Given multiple reassuring testing results, will hold abx at this time pending cultures.  Abd remains benign, no CVAT on initial and re-exams.  Pt has tol PO well while in  the ED without N/V.  Pt has ambulated with steady gait, easy resps, NAD. No clear indication for admission at this time. Dx and testing d/w pt and family.  Questions answered.  Verb understanding, agreeable to d/c home with outpt f/u.    Final Clinical Impressions(s) / ED Diagnoses   Final diagnoses:  None    ED Discharge Orders    None       Francine Graven, DO 02/08/18 2351

## 2018-02-06 NOTE — ED Notes (Signed)
Delay explained to family, waiting on room.

## 2018-02-06 NOTE — Discharge Instructions (Addendum)
Take your usual prescriptions as previously directed. Increase your fluid intake for the next several days. There was not a clear urine infection on your testing today, so a urine culture was obtained. You will receive a call if this culture is positive for infection.  Call your regular medical doctor tomorrow morning to schedule a follow up appointment within the next 2 days.  Return to the Emergency Department immediately sooner if worsening.

## 2018-02-06 NOTE — ED Triage Notes (Signed)
PCP sent to ED for low BP, treating for UTI

## 2018-02-06 NOTE — ED Notes (Signed)
Pt reports that he still has burning with urination. Pt has taken last day of antibiotics for UTI today

## 2018-02-07 DIAGNOSIS — I959 Hypotension, unspecified: Secondary | ICD-10-CM | POA: Diagnosis not present

## 2018-02-07 DIAGNOSIS — Z6838 Body mass index (BMI) 38.0-38.9, adult: Secondary | ICD-10-CM | POA: Diagnosis not present

## 2018-02-07 DIAGNOSIS — R39198 Other difficulties with micturition: Secondary | ICD-10-CM | POA: Diagnosis not present

## 2018-02-08 LAB — URINE CULTURE: Culture: NO GROWTH

## 2018-02-11 LAB — CULTURE, BLOOD (ROUTINE X 2)
Culture: NO GROWTH
Culture: NO GROWTH
Special Requests: ADEQUATE
Special Requests: ADEQUATE

## 2018-02-12 ENCOUNTER — Encounter (HOSPITAL_COMMUNITY): Admission: RE | Payer: Self-pay | Source: Ambulatory Visit

## 2018-02-12 ENCOUNTER — Ambulatory Visit (HOSPITAL_COMMUNITY): Admission: RE | Admit: 2018-02-12 | Payer: Medicare HMO | Source: Ambulatory Visit | Admitting: General Surgery

## 2018-02-12 SURGERY — COLONOSCOPY
Anesthesia: Moderate Sedation

## 2018-02-25 NOTE — Progress Notes (Signed)
Cardiology Office Note    Date:  02/26/2018   ID:  Boyer, Jerry 08/13/1945, MRN [id number]  PCP:  Redmond School, MD  Cardiologist: Kate Sable, MD    Chief Complaint  Patient presents with  . Hospitalization Follow-up    History of Present Illness:    Jerry Boyer is a 72 y.o. male with past medical history of HTN, HLD and BPH who presents to the office today for hospital follow-up.   He was recently admitted to [facility] from 01/29/2018 - 01/31/2018 for evaluation of nausea, fever, and chills found to have Urosepsis. He was admitted and appropriately started on antibiotic therapy. Cardiology was consulted as he was found to have elevated troponin values, peaking at 2.14 during admission. EKG showed no acute ischemic changes and his echocardiogram showed a preserved EF of 60-65% with Grade 2 DD, mild LVH, and mild TR. His enzyme elevation was thought to be most consistent with demand ischemia in the setting of his acute illness, therefore further inpatient ischemic evaluation was not pursued.   In talking with the patient and his wife today, he reports overall doing well since his recent hospitalization. He has noticed some fatigue since hospital discharge but reports this continues to improve on a daily basis. He denies any recent chest discomfort or dyspnea on exertion. He did not have any anginal symptoms prior to admission and denies any symptoms since. Denies any recent orthopnea, PND, lower extremity edema, or palpitations.  He does not exercise regularly but is active at baseline in performing his daily activities.  Reports that his PCP recently reduced his dose of Lisinopril as BP had been soft. BP is at 138/72 during today's visit.  Past Medical History:  Diagnosis Date  . Anxiety   . Arthritis   . BPH (benign prostatic hyperplasia)   . Elevated troponin 01/30/2018   a. 01/2018: Type 2 NSTEMI most consistent with demand ischemia in the setting of Urosepsis.   .  Hypertension     Past Surgical History:  Procedure Laterality Date  . BACK SURGERY     lumbar disc  . CATARACT EXTRACTION W/PHACO Right 02/05/2017   Procedure: CATARACT EXTRACTION PHACO AND INTRAOCULAR LENS PLACEMENT (IOC);  Surgeon: Tonny Branch, MD;  Location: [facility];  Service: Ophthalmology;  Laterality: Right;  CDE: 7.36  . CATARACT EXTRACTION W/PHACO Left 02/19/2017   Procedure: CATARACT EXTRACTION PHACO AND INTRAOCULAR LENS PLACEMENT LEFT EYE;  Surgeon: Tonny Branch, MD;  Location: [facility];  Service: Ophthalmology;  Laterality: Left;  CDE: 7.98  . INGUINAL HERNIA REPAIR Right 12/14/2017   Procedure: HERNIA REPAIR INGUINAL ADULT WITH MESH;  Surgeon: Aviva Signs, MD;  Location: [facility];  Service: General;  Laterality: Right;  . JOINT REPLACEMENT Left   . UMBILICAL HERNIA REPAIR N/A 12/14/2017   Procedure: HERNIA REPAIR UMBILICAL ADULT WITH MESH;  Surgeon: Aviva Signs, MD;  Location: [facility];  Service: General;  Laterality: N/A;    Current Medications: Outpatient Medications Prior to Visit  Medication Sig Dispense Refill  . aspirin EC 81 MG tablet Take 81 mg by mouth daily.    Marland Kitchen atorvastatin (LIPITOR) 40 MG tablet Take 40 mg by mouth every morning.     Marland Kitchen b complex vitamins tablet Take 1 tablet by mouth daily.    . Bilberry 150 MG CAPS Take 150 mg by mouth daily.    . citalopram (CELEXA) 20 MG tablet Take 1 tablet (20 mg total) by mouth daily. 30 tablet 0  .  Coenzyme Q10 (COQ10) 100 MG CAPS Take 100 mg by mouth daily.    Marland Kitchen doxazosin (CARDURA) 4 MG tablet Take 4 mg by mouth at bedtime.    Marland Kitchen ezetimibe (ZETIA) 10 MG tablet Take 10 mg by mouth daily.    Marland Kitchen lisinopril (PRINIVIL,ZESTRIL) 20 MG tablet Take 20 mg by mouth daily.    . meloxicam (MOBIC) 7.5 MG tablet Take 1 tablet (7.5 mg total) by mouth 2 (two) times daily as needed for pain.    . Multiple Vitamins-Minerals (CENTRUM SILVER ADULT 50+ PO) Take 1 tablet by mouth daily.    . Omega-3 Fatty Acids (OMEGA-3 FISH OIL PO) Take 1 capsule by  mouth daily.    . vitamin C (ASCORBIC ACID) 500 MG tablet Take 500 mg by mouth daily.     No facility-administered medications prior to visit.      Allergies:   Patient has no known allergies.   Social History   Socioeconomic History  . Marital status: Single    Spouse name: Not on file  . Number of children: Not on file  . Years of education: Not on file  . Highest education level: Not on file  Occupational History  . Not on file  Social Needs  . Financial resource strain: Not on file  . Food insecurity:    Worry: Not on file    Inability: Not on file  . Transportation needs:    Medical: Not on file    Non-medical: Not on file  Tobacco Use  . Smoking status: Former Smoker    Packs/day: 1.00    Years: 20.00    Pack years: 20.00    Types: Cigarettes    Last attempt to quit: 01/29/2001    Years since quitting: 17.0  . Smokeless tobacco: Never Used  Substance and Sexual Activity  . Alcohol use: Yes    Alcohol/week: 12.6 oz    Types: 21 Cans of beer per week    Comment: states "drinks beer about every day"  . Drug use: No  . Sexual activity: Yes    Birth control/protection: None  Lifestyle  . Physical activity:    Days per week: Not on file    Minutes per session: Not on file  . Stress: Not on file  Relationships  . Social connections:    Talks on phone: Not on file    Gets together: Not on file    Attends religious service: Not on file    Active member of club or organization: Not on file    Attends meetings of clubs or organizations: Not on file    Relationship status: Not on file  Other Topics Concern  . Not on file  Social History Narrative  . Not on file     Family History:  The patient's family history includes Cancer in his father; Hypertension in his mother.   Review of Systems:   Please see the history of present illness.     General:  No chills, fever, night sweats or weight changes. Positive for fatigue (improving).  Cardiovascular:  No chest  pain, dyspnea on exertion, edema, orthopnea, palpitations, paroxysmal nocturnal dyspnea. Dermatological: No rash, lesions/masses Respiratory: No cough, dyspnea Urologic: No hematuria, dysuria Abdominal:   No nausea, vomiting, diarrhea, bright red blood per rectum, melena, or hematemesis Neurologic:  No visual changes, wkns, changes in mental status. All other systems reviewed and are otherwise negative except as noted above.   Physical Exam:    VS:  BP  138/72   Pulse 66   Ht 6\' 1"  (1.854 m)   Wt 275 lb 12.8 oz (125.1 kg)   SpO2 90% Comment: on room air  BMI 36.39 kg/m    General: Well developed, obese Caucasian male appearing in no acute distress. Head: Normocephalic, atraumatic, sclera non-icteric, no xanthomas, nares are without discharge.  Neck: No carotid bruits. JVD not elevated.  Lungs: Respirations regular and unlabored, without wheezes or rales.  Heart: Regular rate and rhythm. No S3 or S4.  No murmur, no rubs, or gallops appreciated. Abdomen: Soft, non-tender, non-distended with normoactive bowel sounds. No hepatomegaly. No rebound/guarding. No obvious abdominal masses. Msk:  Strength and tone appear normal for age. No joint deformities or effusions. Extremities: No clubbing or cyanosis. No lower extremity edema.  Distal pedal pulses are 2+ bilaterally. Neuro: Alert and oriented X 3. Moves all extremities spontaneously. No focal deficits noted. Psych:  Responds to questions appropriately with a normal affect. Skin: No rashes or lesions noted  Wt Readings from Last 3 Encounters:  02/26/18 275 lb 12.8 oz (125.1 kg)  02/06/18 277 lb (125.6 kg)  01/29/18 277 lb 4.8 oz (125.8 kg)     Studies/Labs Reviewed:   EKG:  EKG is not ordered today.  Recent Labs: 01/30/2018: Magnesium 2.0 02/06/2018: ALT 40; BUN 21; Creatinine, Ser 1.14; Hemoglobin 13.7; Platelets 254; Potassium 4.3; Sodium 137   Lipid Panel No results found for: CHOL, TRIG, HDL, CHOLHDL, VLDL, LDLCALC,  LDLDIRECT  Additional studies/ records that were reviewed today include:   Echocardiogram: 01/30/2018 Study Conclusions  - Left ventricle: The cavity size was normal. Wall thickness was   increased in a pattern of mild LVH. Systolic function was normal.   The estimated ejection fraction was in the range of 60% to 65%.   Wall motion was normal; there were no regional wall motion   abnormalities. Features are consistent with a pseudonormal left   ventricular filling pattern, with concomitant abnormal relaxation   and increased filling pressure (grade 2 diastolic dysfunction).   Doppler parameters are consistent with high ventricular filling   pressure. - Aortic valve: Mildly thickened, mildly calcified leaflets.   Sclerosis without stenosis. - Aorta: Minimally enlarged aortic root. - Left atrium: The atrium was moderately dilated. - Tricuspid valve: There was mild regurgitation. - Pulmonary arteries: PA peak pressure: 44 mm Hg (S). - Systemic veins: IVC is dilated with normal respiratory variation.   Estimated CVP 8 mmHg.  Assessment:    1. Elevated troponin   2. Essential hypertension   3. Hyperlipidemia, unspecified hyperlipidemia type      Plan:   In order of problems listed above:  1. Elevated Troponin in the setting of Urosepsis - He was recently admitted for Urosepsis and found to have elevated troponin values, peaking at 2.14 during admission. EKG showed no acute ischemic changes and echo showed a preserved EF of 60-65% with Grade 2 DD, mild LVH, and mild TR. Troponin elevation was thought to be most consistent with demand ischemia in the setting of his acute illness and further ischemic evaluation was not pursued given his asymptomatic state. - He has overall progressed well since his recent hospitalization and denies any recent anginal symptoms. No chest discomfort or dyspnea on exertion prior to his admission. We reviewed possibly pursuing a stress test given his  enzyme elevation but he wishes to hold off for now which seems reasonable given his asymptomatic state. Advised him that if he develops any chest discomfort or dyspnea on  exertion, to please let us know as we could pursue a Myoview at that time. - continue ASA and statin therapy.   2. HTN - BP is well-controlled at 138/72 during today's visit. - Continue Doxazosin 4 mg daily and Lisinopril 20 mg daily.  3. HLD - Followed by PCP. No recent FLP on file by review of Epic. Remains on Atorvastatin 40 mg daily and Zetia 10 mg daily.   Medication Adjustments/Labs and Tests Ordered: Current medicines are reviewed at length with the patient today.  Concerns regarding medicines are outlined above.  Medication changes, Labs and Tests ordered today are listed in the Patient Instructions below. Patient Instructions  Medication Instructions:  Your physician recommends that you continue on your current medications as directed. Please refer to the Current Medication list given to you today.   Labwork: NONE   Testing/Procedures: NONE   Follow-Up: Your physician wants you to follow-up in: 6 Months. You will receive a reminder letter in the mail two months in advance. If you don't receive a letter, please call our office to schedule the follow-up appointment.  Any Other Special Instructions Will Be Listed Below (If Applicable).   If you need a refill on your cardiac medications before your next appointment, please call your pharmacy.    Signed, Erma Heritage, PA-C  02/26/2018 4:48 PM    [facility] [address] [place], [place] [address] Phone: [phone]

## 2018-02-26 ENCOUNTER — Ambulatory Visit: Payer: Medicare HMO | Admitting: Student

## 2018-02-26 ENCOUNTER — Encounter: Payer: Self-pay | Admitting: Student

## 2018-02-26 VITALS — BP 138/72 | HR 66 | Ht 73.0 in | Wt 275.8 lb

## 2018-02-26 DIAGNOSIS — E785 Hyperlipidemia, unspecified: Secondary | ICD-10-CM

## 2018-02-26 DIAGNOSIS — I10 Essential (primary) hypertension: Secondary | ICD-10-CM | POA: Diagnosis not present

## 2018-02-26 DIAGNOSIS — R748 Abnormal levels of other serum enzymes: Secondary | ICD-10-CM | POA: Diagnosis not present

## 2018-02-26 DIAGNOSIS — R7989 Other specified abnormal findings of blood chemistry: Principal | ICD-10-CM

## 2018-02-26 DIAGNOSIS — R778 Other specified abnormalities of plasma proteins: Secondary | ICD-10-CM

## 2018-02-26 NOTE — Patient Instructions (Signed)

## 2018-03-14 ENCOUNTER — Other Ambulatory Visit: Payer: Self-pay | Admitting: General Surgery

## 2018-03-14 MED ORDER — PEG 3350-KCL-NABCB-NACL-NASULF 236 G PO SOLR: 4000.0000 mL | Freq: Once | ORAL | 0 refills | Status: AC

## 2018-03-14 NOTE — H&P (Signed)
Jerry Boyer is an 72 y.o. male.   Chief Complaint: Need for screening colonoscopy HPI: Patient is a 72 year old white male who was referred for a screening colonoscopy.  Patient denies any family history of colon cancer.  He denies any blood per rectum, weight loss, abdominal pain, significant diarrhea or constipation.  He has 0 out of 10 abdominal pain.  Past Medical History:  Diagnosis Date  . Anxiety   . Arthritis   . BPH (benign prostatic hyperplasia)   . Elevated troponin 01/30/2018   a. 01/2018: Type 2 NSTEMI most consistent with demand ischemia in the setting of Urosepsis.   . Hypertension     Past Surgical History:  Procedure Laterality Date  . BACK SURGERY     lumbar disc  . CATARACT EXTRACTION W/PHACO Right 02/05/2017   Procedure: CATARACT EXTRACTION PHACO AND INTRAOCULAR LENS PLACEMENT (IOC);  Surgeon: Tonny Branch, MD;  Location: [facility];  Service: Ophthalmology;  Laterality: Right;  CDE: 7.36  . CATARACT EXTRACTION W/PHACO Left 02/19/2017   Procedure: CATARACT EXTRACTION PHACO AND INTRAOCULAR LENS PLACEMENT LEFT EYE;  Surgeon: Tonny Branch, MD;  Location: [facility];  Service: Ophthalmology;  Laterality: Left;  CDE: 7.98  . INGUINAL HERNIA REPAIR Right 12/14/2017   Procedure: HERNIA REPAIR INGUINAL ADULT WITH MESH;  Surgeon: Aviva Signs, MD;  Location: [facility];  Service: General;  Laterality: Right;  . JOINT REPLACEMENT Left   . UMBILICAL HERNIA REPAIR N/A 12/14/2017   Procedure: HERNIA REPAIR UMBILICAL ADULT WITH MESH;  Surgeon: Aviva Signs, MD;  Location: [facility];  Service: General;  Laterality: N/A;    Family History  Problem Relation Age of Onset  . Hypertension Mother   . Cancer Father    Social History:  reports that he quit smoking about 17 years ago. His smoking use included cigarettes. He has a 20.00 pack-year smoking history. He has never used smokeless tobacco. He reports that he drinks about 21.0 standard drinks of alcohol per week. He reports that he does not use  drugs.  Allergies: No Known Allergies  No medications prior to admission.    No results found for this or any previous visit (from the past 48 hour(s)). No results found.  Review of Systems  Constitutional: Negative.   HENT: Positive for ear pain.   Eyes: Negative.   Respiratory: Negative.   Cardiovascular: Negative.   Gastrointestinal: Negative.   Genitourinary: Positive for frequency and urgency.  Musculoskeletal: Positive for back pain and joint pain.  Skin: Negative.   Neurological: Negative.   Endo/Heme/Allergies: Negative.   Psychiatric/Behavioral: Negative.     There were no vitals taken for this visit. Physical Exam  Vitals reviewed. Constitutional: He is oriented to person, place, and time. He appears well-developed and well-nourished. No distress.  HENT:  Head: Normocephalic and atraumatic.  Cardiovascular: Normal rate, regular rhythm and normal heart sounds. Exam reveals no gallop and no friction rub.  No murmur heard. Respiratory: Effort normal and breath sounds normal. No respiratory distress. He has no wheezes. He has no rales.  GI: Soft. Bowel sounds are normal. He exhibits no distension. There is no tenderness. There is no rebound and no guarding.  Neurological: He is alert and oriented to person, place, and time.  Skin: Skin is warm and dry.     Assessment/Plan Impression: Need for screening colonoscopy Plan: Patient is scheduled for a screening colonoscopy on 03/19/2018.  The risks and benefits of the procedure including bleeding and perforation were fully explained to the patient,  who gave informed consent.  Aviva Signs, MD 03/14/2018, 8:59 AM

## 2018-03-19 ENCOUNTER — Encounter (HOSPITAL_COMMUNITY): Payer: Self-pay | Admitting: *Deleted

## 2018-03-19 ENCOUNTER — Ambulatory Visit (HOSPITAL_COMMUNITY)
Admission: RE | Admit: 2018-03-19 | Discharge: 2018-03-19 | Disposition: A | Discharge status: Home / Self Care | Payer: Medicare HMO | Source: Ambulatory Visit | Attending: General Surgery | Admitting: General Surgery

## 2018-03-19 ENCOUNTER — Encounter (HOSPITAL_COMMUNITY)
Admission: RE | Disposition: A | Discharge status: Home / Self Care | Payer: Self-pay | Source: Ambulatory Visit | Attending: General Surgery

## 2018-03-19 ENCOUNTER — Other Ambulatory Visit: Payer: Self-pay

## 2018-03-19 DIAGNOSIS — K621 Rectal polyp: Secondary | ICD-10-CM | POA: Diagnosis not present

## 2018-03-19 DIAGNOSIS — I252 Old myocardial infarction: Secondary | ICD-10-CM | POA: Diagnosis not present

## 2018-03-19 DIAGNOSIS — Z79899 Other long term (current) drug therapy: Secondary | ICD-10-CM | POA: Diagnosis not present

## 2018-03-19 DIAGNOSIS — D125 Benign neoplasm of sigmoid colon: Secondary | ICD-10-CM | POA: Insufficient documentation

## 2018-03-19 DIAGNOSIS — D124 Benign neoplasm of descending colon: Secondary | ICD-10-CM | POA: Diagnosis not present

## 2018-03-19 DIAGNOSIS — K5730 Diverticulosis of large intestine without perforation or abscess without bleeding: Secondary | ICD-10-CM | POA: Diagnosis not present

## 2018-03-19 DIAGNOSIS — D122 Benign neoplasm of ascending colon: Secondary | ICD-10-CM | POA: Insufficient documentation

## 2018-03-19 DIAGNOSIS — Z1211 Encounter for screening for malignant neoplasm of colon: Secondary | ICD-10-CM | POA: Diagnosis not present

## 2018-03-19 DIAGNOSIS — N400 Enlarged prostate without lower urinary tract symptoms: Secondary | ICD-10-CM | POA: Diagnosis not present

## 2018-03-19 DIAGNOSIS — D128 Benign neoplasm of rectum: Secondary | ICD-10-CM | POA: Insufficient documentation

## 2018-03-19 DIAGNOSIS — F419 Anxiety disorder, unspecified: Secondary | ICD-10-CM | POA: Diagnosis not present

## 2018-03-19 DIAGNOSIS — I10 Essential (primary) hypertension: Secondary | ICD-10-CM | POA: Insufficient documentation

## 2018-03-19 DIAGNOSIS — Z87891 Personal history of nicotine dependence: Secondary | ICD-10-CM | POA: Insufficient documentation

## 2018-03-19 HISTORY — PX: POLYPECTOMY: SHX5525

## 2018-03-19 HISTORY — DX: Pure hypercholesterolemia, unspecified: E78.00

## 2018-03-19 HISTORY — PX: COLONOSCOPY: SHX5424

## 2018-03-19 SURGERY — COLONOSCOPY
Anesthesia: Moderate Sedation

## 2018-03-19 MED ORDER — MEPERIDINE HCL 50 MG/ML IJ SOLN
INTRAMUSCULAR | Status: DC | PRN
  Administered 2018-03-19: 50 mg via INTRAVENOUS

## 2018-03-19 MED ORDER — MIDAZOLAM HCL 5 MG/5ML IJ SOLN
INTRAMUSCULAR | Status: AC
  Filled 2018-03-19: qty 10

## 2018-03-19 MED ORDER — MEPERIDINE HCL 50 MG/ML IJ SOLN
INTRAMUSCULAR | Status: AC
  Filled 2018-03-19: qty 1

## 2018-03-19 MED ORDER — STERILE WATER FOR IRRIGATION IR SOLN
Status: DC | PRN
  Administered 2018-03-19: 1.5 mL

## 2018-03-19 MED ORDER — MIDAZOLAM HCL 5 MG/5ML IJ SOLN
INTRAMUSCULAR | Status: DC | PRN
  Administered 2018-03-19: 2 mg via INTRAVENOUS
  Administered 2018-03-19: 1 mg via INTRAVENOUS

## 2018-03-19 MED ORDER — SODIUM CHLORIDE 0.9 % IV SOLN
INTRAVENOUS | Status: DC
  Administered 2018-03-19: 08:00:00 via INTRAVENOUS

## 2018-03-19 NOTE — Discharge Instructions (Signed)
Colonoscopy, Adult, Care After  This sheet gives you information about how to care for yourself after your procedure. Your health care provider may also give you more specific instructions. If you have problems or questions, contact your health care provider.  What can I expect after the procedure?  After the procedure, it is common to have:  · A small amount of blood in your stool for 24 hours after the procedure.  · Some gas.  · Mild abdominal cramping or bloating.    Follow these instructions at home:  General instructions    · For the first 24 hours after the procedure:  ? Do not drive or use machinery.  ? Do not sign important documents.  ? Do not drink alcohol.  ? Do your regular daily activities at a slower pace than normal.  ? Eat soft, easy-to-digest foods.  ? Rest often.  · Take over-the-counter or prescription medicines only as told by your health care provider.  · It is up to you to get the results of your procedure. Ask your health care provider, or the department performing the procedure, when your results will be ready.  Relieving cramping and bloating  · Try walking around when you have cramps or feel bloated.  · Apply heat to your abdomen as told by your health care provider. Use a heat source that your health care provider recommends, such as a moist heat pack or a heating pad.  ? Place a towel between your skin and the heat source.  ? Leave the heat on for 20-30 minutes.  ? Remove the heat if your skin turns bright red. This is especially important if you are unable to feel pain, heat, or cold. You may have a greater risk of getting burned.  Eating and drinking  · Drink enough fluid to keep your urine clear or pale yellow.  · Resume your normal diet as instructed by your health care provider. Avoid heavy or fried foods that are hard to digest.  · Avoid drinking alcohol for as long as instructed by your health care provider.  Contact a health care provider if:  · You have blood in your stool 2-3  days after the procedure.  Get help right away if:  · You have more than a small spotting of blood in your stool.  · You pass large blood clots in your stool.  · Your abdomen is swollen.  · You have nausea or vomiting.  · You have a fever.  · You have increasing abdominal pain that is not relieved with medicine.  This information is not intended to replace advice given to you by your health care provider. Make sure you discuss any questions you have with your health care provider.  Document Released: 02/29/2004 Document Revised: 04/10/2016 Document Reviewed: 09/28/2015  Elsevier Interactive Patient Education © 2018 [employer]

## 2018-03-19 NOTE — Interval H&P Note (Signed)
History and Physical Interval Note:  03/19/2018 8:05 AM  Jerry Boyer  has presented today for surgery, with the diagnosis of screening  The various methods of treatment have been discussed with the patient and family. After consideration of risks, benefits and other options for treatment, the patient has consented to  Procedure(s): COLONOSCOPY (N/A) as a surgical intervention .  The patient's history has been reviewed, patient examined, no change in status, stable for surgery.  I have reviewed the patient's chart and labs.  Questions were answered to the patient's satisfaction.     Aviva Signs

## 2018-03-19 NOTE — Op Note (Signed)
[facility] Patient Name: Jerry Boyer Procedure Date: 03/19/2018 7:47 AM MRN: [id number] Date of Birth: Nov 04, 1945 Attending MD: Aviva Signs , MD CSN: [id number] Age: 72 Admit Type: Outpatient Procedure:                Colonoscopy Indications:              Screening for colorectal malignant neoplasm Providers:                Aviva Signs, MD, Charlsie Quest. Theda Sers RN, RN, Aram Candela Referring MD:              Medicines:                Midazolam 3 mg IV, Meperidine 50 mg IV Complications:            No immediate complications. Estimated Blood Loss:     Estimated blood loss: none. Procedure:                Pre-Anesthesia Assessment:                           - Prior to the procedure, a History and Physical                            was performed, and patient medications and                            allergies were reviewed. The patient is competent.                            The risks and benefits of the procedure and the                            sedation options and risks were discussed with the                            patient. All questions were answered and informed                            consent was obtained. Patient identification and                            proposed procedure were verified by the physician,                            the nurse and the technician in the endoscopy                            suite. Mental Status Examination: alert and                            oriented. Airway Examination: normal oropharyngeal  airway and neck mobility. Respiratory Examination:                            clear to auscultation. CV Examination: RRR, no                            murmurs, no S3 or S4. Prophylactic Antibiotics: The                            patient does not require prophylactic antibiotics.                            Prior Anticoagulants: The patient has taken                            aspirin.  ASA Grade Assessment: II - A patient with                            mild systemic disease. After reviewing the risks                            and benefits, the patient was deemed in                            satisfactory condition to undergo the procedure.                            The anesthesia plan was to use moderate sedation /                            analgesia (conscious sedation). Immediately prior                            to administration of medications, the patient was                            re-assessed for adequacy to receive sedatives. The                            heart rate, respiratory rate, oxygen saturations,                            blood pressure, adequacy of pulmonary ventilation,                            and response to care were monitored throughout the                            procedure. The physical status of the patient was                            re-assessed after the procedure.  After obtaining informed consent, the colonoscope                            was passed under direct vision. Throughout the                            procedure, the patient's blood pressure, pulse, and                            oxygen saturations were monitored continuously. The                            CF-HQ190L ([id number]) scope was introduced through                            the anus and advanced to the the cecum, identified                            by the appendiceal orifice, ileocecal valve and                            palpation. No anatomical landmarks were                            photographed. The entire colon was well visualized.                            The colonoscopy was performed without difficulty.                            The patient tolerated the procedure well. The                            quality of the bowel preparation was adequate. The                            total duration of the procedure was 19  minutes. Scope In: 8:09:50 AM Scope Out: 8:26:55 AM Scope Withdrawal Time: 0 hours 12 minutes 39 seconds  Total Procedure Duration: 0 hours 17 minutes 5 seconds  Findings:      The perianal and digital rectal examinations were normal.      A few small-mouthed diverticula were found in the entire colon.      A 5 mm polyp was found in the proximal ascending colon. The polyp was       sessile. The polyp was removed with a hot snare. Resection and retrieval       were complete. Estimated blood loss: none.      A 3 mm polyp was found in the distal descending colon. The polyp was       sessile. The polyp was removed with a hot snare. Resection and retrieval       were complete. Estimated blood loss: none.      The entire examined colon appeared normal.      A 3 mm polyp was found in the rectum. The polyp was sessile. The polyp  was removed with a hot snare. Resection and retrieval were complete.       Estimated blood loss: none.      The entire examined colon appeared normal on direct and retroflexion       views. Impression:               - Diverticulosis in the entire examined colon.                           - One 3 mm polyp in the proximal ascending colon,                            removed with a hot snare. Resected and retrieved.                           - One 3 mm polyp in the distal descending colon,                            removed with a hot snare. Resected and retrieved.                           - The entire examined colon is normal.                           - One 3 mm polyp in the rectum, removed with a hot                            snare. Resected and retrieved.                           - The entire examined colon is normal on direct and                            retroflexion views. Moderate Sedation:      Moderate (conscious) sedation was administered by the endoscopy nurse       and supervised by the endoscopist. The following parameters were       monitored:  oxygen saturation, heart rate, blood pressure, and response       to care. Recommendation:           - Written discharge instructions were provided to                            the patient.                           - The signs and symptoms of potential delayed                            complications were discussed with the patient.                           - Patient has a contact number available for  emergencies.                           - Return to normal activities tomorrow.                           - Resume previous diet.                           - Continue present medications.                           - Await pathology results.                           - Repeat colonoscopy is recommended. The                            colonoscopy date will be determined after pathology                            results from today's exam become available for                            review. Procedure Code(s):        --- Professional ---                           [phone], Colonoscopy, flexible; with removal of                            tumor(s), polyp(s), or other lesion(s) by snare                            technique Diagnosis Code(s):        --- Professional ---                           Z12.11, Encounter for screening for malignant                            neoplasm of colon                           D12.2, Benign neoplasm of ascending colon                           D12.4, Benign neoplasm of descending colon                           K62.1, Rectal polyp                           K57.30, Diverticulosis of large intestine without                            perforation or abscess without bleeding CPT copyright 2017 American Medical Association. All rights reserved. The codes documented  in this report are preliminary and upon coder review may  be revised to meet current compliance requirements. Aviva Signs, MD Aviva Signs, MD 03/19/2018 8:39:23 AM This  report has been signed electronically. Number of Addenda: 0

## 2018-03-22 ENCOUNTER — Encounter (HOSPITAL_COMMUNITY): Payer: Self-pay | Admitting: General Surgery

## 2018-03-25 ENCOUNTER — Inpatient Hospital Stay (HOSPITAL_COMMUNITY)
Admission: EM | Admit: 2018-03-25 | Discharge: 2018-03-27 | DRG: 920 | Disposition: A | Discharge status: Home / Self Care | Payer: Medicare HMO | Attending: General Surgery | Admitting: General Surgery

## 2018-03-25 ENCOUNTER — Encounter (HOSPITAL_COMMUNITY): Payer: Self-pay

## 2018-03-25 ENCOUNTER — Other Ambulatory Visit: Payer: Self-pay

## 2018-03-25 DIAGNOSIS — Y838 Other surgical procedures as the cause of abnormal reaction of the patient, or of later complication, without mention of misadventure at the time of the procedure: Secondary | ICD-10-CM | POA: Diagnosis present

## 2018-03-25 DIAGNOSIS — K5790 Diverticulosis of intestine, part unspecified, without perforation or abscess without bleeding: Secondary | ICD-10-CM | POA: Diagnosis present

## 2018-03-25 DIAGNOSIS — K921 Melena: Secondary | ICD-10-CM | POA: Diagnosis present

## 2018-03-25 DIAGNOSIS — K91840 Postprocedural hemorrhage and hematoma of a digestive system organ or structure following a digestive system procedure: Principal | ICD-10-CM | POA: Diagnosis present

## 2018-03-25 DIAGNOSIS — Z91013 Allergy to seafood: Secondary | ICD-10-CM | POA: Diagnosis not present

## 2018-03-25 DIAGNOSIS — I252 Old myocardial infarction: Secondary | ICD-10-CM

## 2018-03-25 DIAGNOSIS — Z961 Presence of intraocular lens: Secondary | ICD-10-CM | POA: Diagnosis present

## 2018-03-25 DIAGNOSIS — Z9842 Cataract extraction status, left eye: Secondary | ICD-10-CM

## 2018-03-25 DIAGNOSIS — K625 Hemorrhage of anus and rectum: Secondary | ICD-10-CM

## 2018-03-25 DIAGNOSIS — Z9841 Cataract extraction status, right eye: Secondary | ICD-10-CM | POA: Diagnosis not present

## 2018-03-25 DIAGNOSIS — R42 Dizziness and giddiness: Secondary | ICD-10-CM | POA: Diagnosis not present

## 2018-03-25 DIAGNOSIS — Z87891 Personal history of nicotine dependence: Secondary | ICD-10-CM | POA: Diagnosis not present

## 2018-03-25 LAB — COMPREHENSIVE METABOLIC PANEL
ALBUMIN: 3.7 g/dL (ref 3.5–5.0)
ALT: 26 U/L (ref 0–44)
ANION GAP: 12 (ref 5–15)
AST: 24 U/L (ref 15–41)
Alkaline Phosphatase: 72 U/L (ref 38–126)
BUN: 20 mg/dL (ref 8–23)
CO2: 21 mmol/L — ABNORMAL LOW (ref 22–32)
Calcium: 9.2 mg/dL (ref 8.9–10.3)
Chloride: 107 mmol/L (ref 98–111)
Creatinine, Ser: 0.86 mg/dL (ref 0.61–1.24)
GFR calc Af Amer: >60 mL/min (ref >60)
GFR calc non Af Amer: >60 mL/min (ref >60)
GLUCOSE: 127 mg/dL — AB (ref 70–99)
POTASSIUM: 4.3 mmol/L (ref 3.5–5.1)
SODIUM: 140 mmol/L (ref 135–145)
TOTAL PROTEIN: 6.5 g/dL (ref 6.5–8.1)
Total Bilirubin: 0.8 mg/dL (ref 0.3–1.2)

## 2018-03-25 LAB — CBC WITH DIFFERENTIAL/PLATELET
BAND NEUTROPHILS: 0 %
BASOS ABS: 0.1 10*3/uL (ref 0.0–0.1)
BASOS PCT: 2 %
BLASTS: 0 %
EOS ABS: 0.2 10*3/uL (ref 0.0–0.7)
Eosinophils Relative: 3 %
HCT: 39.6 % (ref 39.0–52.0)
Hemoglobin: 13.2 g/dL (ref 13.0–17.0)
LYMPHS ABS: 1.7 10*3/uL (ref 0.7–4.0)
LYMPHS PCT: 25 %
MCH: 31.2 pg (ref 26.0–34.0)
MCHC: 33.3 g/dL (ref 30.0–36.0)
MCV: 93.6 fL (ref 78.0–100.0)
MONOS PCT: 11 %
Metamyelocytes Relative: 0 %
Monocytes Absolute: 0.7 10*3/uL (ref 0.1–1.0)
Myelocytes: 0 %
NEUTROS PCT: 59 %
NRBC: 0 /100{WBCs}
Neutro Abs: 4.1 10*3/uL (ref 1.7–7.7)
PLATELETS: 190 10*3/uL (ref 150–400)
PROMYELOCYTES RELATIVE: 0 %
RBC: 4.23 MIL/uL (ref 4.22–5.81)
RDW: 14.4 % (ref 11.5–15.5)
WBC: 6.8 10*3/uL (ref 4.0–10.5)

## 2018-03-25 LAB — TYPE AND SCREEN
ABO/RH(D): O POS
Antibody Screen: NEGATIVE

## 2018-03-25 LAB — HEMOGLOBIN AND HEMATOCRIT, BLOOD
HCT: 39.8 % (ref 39.0–52.0)
HCT: 34 % — ABNORMAL LOW (ref 39.0–52.0)
Hemoglobin: 13.1 g/dL (ref 13.0–17.0)
Hemoglobin: 11.2 g/dL — ABNORMAL LOW (ref 13.0–17.0)

## 2018-03-25 LAB — POC OCCULT BLOOD, ED: Fecal Occult Bld: POSITIVE — AB

## 2018-03-25 MED ORDER — LISINOPRIL 10 MG PO TABS
20.0000 mg | ORAL_TABLET | Freq: Every day | ORAL | Status: DC
  Administered 2018-03-25 – 2018-03-27 (×3): 20 mg via ORAL
  Filled 2018-03-25 (×3): qty 2

## 2018-03-25 MED ORDER — ONDANSETRON 4 MG PO TBDP: 4.0000 mg | ORAL_TABLET | Freq: Four times a day (QID) | ORAL | Status: DC | PRN

## 2018-03-25 MED ORDER — ATORVASTATIN CALCIUM 40 MG PO TABS
40.0000 mg | ORAL_TABLET | Freq: Every morning | ORAL | Status: DC
  Administered 2018-03-25 – 2018-03-27 (×3): 40 mg via ORAL
  Filled 2018-03-25 (×3): qty 1

## 2018-03-25 MED ORDER — ACETAMINOPHEN 650 MG RE SUPP: 650.0000 mg | Freq: Four times a day (QID) | RECTAL | Status: DC | PRN

## 2018-03-25 MED ORDER — ZOLPIDEM TARTRATE 5 MG PO TABS
5.0000 mg | ORAL_TABLET | Freq: Every evening | ORAL | Status: DC | PRN
  Administered 2018-03-25 – 2018-03-26 (×2): 5 mg via ORAL
  Filled 2018-03-25 (×2): qty 1

## 2018-03-25 MED ORDER — EZETIMIBE 10 MG PO TABS
10.0000 mg | ORAL_TABLET | Freq: Every day | ORAL | Status: DC
  Administered 2018-03-25 – 2018-03-27 (×3): 10 mg via ORAL
  Filled 2018-03-25 (×3): qty 1

## 2018-03-25 MED ORDER — ACETAMINOPHEN 325 MG PO TABS: 650.0000 mg | ORAL_TABLET | Freq: Four times a day (QID) | ORAL | Status: DC | PRN

## 2018-03-25 MED ORDER — DOXAZOSIN MESYLATE 2 MG PO TABS
4.0000 mg | ORAL_TABLET | Freq: Every day | ORAL | Status: DC
  Administered 2018-03-25 – 2018-03-26 (×2): 4 mg via ORAL
  Filled 2018-03-25 (×2): qty 2

## 2018-03-25 MED ORDER — ONDANSETRON HCL 4 MG/2ML IJ SOLN: 4.0000 mg | Freq: Four times a day (QID) | INTRAMUSCULAR | Status: DC | PRN

## 2018-03-25 MED ORDER — LACTATED RINGERS IV SOLN
INTRAVENOUS | Status: DC
  Administered 2018-03-25 – 2018-03-26 (×2): via INTRAVENOUS

## 2018-03-25 NOTE — ED Notes (Signed)
Lab here to redraw blood

## 2018-03-25 NOTE — ED Provider Notes (Signed)
[facility] EMERGENCY DEPARTMENT Provider Note   CSN: [id number] Arrival date & time: 03/25/18  0448     History   Chief Complaint Chief Complaint  Patient presents with  . Rectal Bleeding    HPI Jerry Boyer is a 72 y.o. male.  The history is provided by the patient.  He has history of hypertension, hyperlipidemia, colon polyps and comes in with bright red rectal bleeding x4.  About midnight, he had a bowel movement which was bright red blood in stool mixed.  Since then, he has had 3 bowel movements which were just blood and a fairly large quantity.  He denies abdominal pain but is feeling lightheaded.  He denies nausea or vomiting.  He had a colonoscopy on August 20 at which time 3 polyps were removed.  He takes low-dose aspirin, but no other anticoagulants or antiplatelet agents.  Past Medical History:  Diagnosis Date  . Anxiety   . Arthritis   . BPH (benign prostatic hyperplasia)   . Elevated troponin 01/30/2018   a. 01/2018: Type 2 NSTEMI most consistent with demand ischemia in the setting of Urosepsis.   . Hypercholesteremia   . Hypertension     Patient Active Problem List   Diagnosis Date Noted  . Special screening for malignant neoplasms, colon   . Benign neoplasm of ascending colon   . Benign neoplasm of descending colon   . Rectal polyp   . NSTEMI (non-ST elevated myocardial infarction) ([place]) 01/30/2018  . Sepsis ([place]) 01/29/2018  . Acute lower UTI 01/29/2018  . HLD (hyperlipidemia) 01/29/2018  . HTN (hypertension) 01/29/2018  . Elevated troponin 01/29/2018  . Non-recurrent unilateral inguinal hernia without obstruction or gangrene   . Umbilical hernia without obstruction and without gangrene     Past Surgical History:  Procedure Laterality Date  . BACK SURGERY     lumbar disc  . CATARACT EXTRACTION W/PHACO Right 02/05/2017   Procedure: CATARACT EXTRACTION PHACO AND INTRAOCULAR LENS PLACEMENT (IOC);  Surgeon: Tonny Branch, MD;  Location: [facility];   Service: Ophthalmology;  Laterality: Right;  CDE: 7.36  . CATARACT EXTRACTION W/PHACO Left 02/19/2017   Procedure: CATARACT EXTRACTION PHACO AND INTRAOCULAR LENS PLACEMENT LEFT EYE;  Surgeon: Tonny Branch, MD;  Location: [facility];  Service: Ophthalmology;  Laterality: Left;  CDE: 7.98  . COLONOSCOPY    . COLONOSCOPY N/A 03/19/2018   Procedure: COLONOSCOPY;  Surgeon: Aviva Signs, MD;  Location: [facility] END[facility] SUITE;  Service: Gastroenterology;  Laterality: N/A;  . INGUINAL HERNIA REPAIR Right 12/14/2017   Procedure: HERNIA REPAIR INGUINAL ADULT WITH MESH;  Surgeon: Aviva Signs, MD;  Location: [facility];  Service: General;  Laterality: Right;  . JOINT REPLACEMENT Left   . POLYPECTOMY  03/19/2018   Procedure: POLYPECTOMY;  Surgeon: Aviva Signs, MD;  Location: [facility];  Service: Gastroenterology;;  . UMBILICAL HERNIA REPAIR N/A 12/14/2017   Procedure: HERNIA REPAIR UMBILICAL ADULT WITH MESH;  Surgeon: Aviva Signs, MD;  Location: [facility];  Service: General;  Laterality: N/A;        Home Medications    Prior to Admission medications   Medication Sig Start Date End Date Taking? Authorizing Provider  aspirin EC 81 MG tablet Take 81 mg by mouth daily.    [provider]  atorvastatin (LIPITOR) 40 MG tablet Take 40 mg by mouth every morning.     [provider]  b complex vitamins tablet Take 1 tablet by mouth daily.    [provider]  Bilberry 150  MG CAPS Take 150 mg by mouth daily.    [provider]  citalopram (CELEXA) 20 MG tablet Take 1 tablet (20 mg total) by mouth daily. 01/31/18 03/02/18  Johnson, Clanford L, MD  Coenzyme Q10 (COQ10) 100 MG CAPS Take 100 mg by mouth daily.    [provider]  doxazosin (CARDURA) 4 MG tablet Take 4 mg by mouth at bedtime. 11/20/17   [provider]  ezetimibe (ZETIA) 10 MG tablet Take 10 mg by mouth daily. 11/29/17   [provider]  lisinopril (PRINIVIL,ZESTRIL) 20 MG tablet Take 20 mg by mouth daily.     [provider]  meloxicam (MOBIC) 7.5 MG tablet Take 1 tablet (7.5 mg total) by mouth 2 (two) times daily as needed for pain. 01/31/18   Johnson, Clanford L, MD  Multiple Vitamins-Minerals (CENTRUM SILVER ADULT 50+ PO) Take 1 tablet by mouth daily.    [provider]  Omega-3 Fatty Acids (OMEGA-3 FISH OIL PO) Take 1 capsule by mouth daily.    [provider]  vitamin C (ASCORBIC ACID) 500 MG tablet Take 500 mg by mouth daily.    [provider]    Family History Family History  Problem Relation Age of Onset  . Hypertension Mother   . Cancer Father   . Colon cancer Neg Hx     Social History Social History   Tobacco Use  . Smoking status: Former Smoker    Packs/day: 1.00    Years: 20.00    Pack years: 20.00    Types: Cigarettes    Last attempt to quit: 01/29/2001    Years since quitting: 17.1  . Smokeless tobacco: Never Used  Substance Use Topics  . Alcohol use: Yes    Alcohol/week: 21.0 standard drinks    Types: 21 Cans of beer per week    Comment: states "drinks beer about every day"  . Drug use: No     Allergies   Patient has no known allergies.   Review of Systems Review of Systems  All other systems reviewed and are negative.    Physical Exam Updated Vital Signs BP (!) 110/58   Pulse 73   Temp (!) 97.1 F (36.2 C) (Oral)   Resp 17   Ht 6\' 1"  (1.854 m)   Wt 117.9 kg   SpO2 94%   BMI 34.30 kg/m   Physical Exam  Nursing note and vitals reviewed.  72 year old male, resting comfortably and in no acute distress. Vital signs are normal. Oxygen saturation is 94%, which is normal. Head is normocephalic and atraumatic. PERRLA, EOMI. Oropharynx is clear. Neck is nontender and supple without adenopathy or JVD. Back is nontender and there is no CVA tenderness. Lungs are clear without rales, wheezes, or rhonchi. Chest is nontender. Heart has regular rate and rhythm without murmur. Abdomen is soft, flat, nontender without  masses or hepatosplenomegaly and peristalsis is normoactive.  Rectal: Normal sphincter tone.  No masses or internal hemorrhoids palpable.  Moderate amount of bright red blood present, and is Hemoccult positive. Extremities have no cyanosis or edema, full range of motion is present. Skin is warm and dry without rash. Neurologic: Mental status is normal, cranial nerves are intact, there are no motor or sensory deficits.  ED Treatments / Results  Labs (all labs ordered are listed, but only abnormal results are displayed) Labs Reviewed  COMPREHENSIVE METABOLIC PANEL - Abnormal; Notable for the following components:      Result Value  CO2 21 (*)    Glucose, Bld 127 (*)    All other components within normal limits  POC OCCULT BLOOD, ED - Abnormal; Notable for the following components:   Fecal Occult Bld POSITIVE (*)    All other components within normal limits  CBC WITH DIFFERENTIAL/PLATELET  HEMOGLOBIN AND HEMATOCRIT, BLOOD  TYPE AND SCREEN   Procedures Procedures   Medications Ordered in ED Medications - No data to display   Initial Impression / Assessment and Plan / ED Course  I have reviewed the triage vital signs and the nursing notes.  Pertinent labs & imaging results that were available during my care of the patient were reviewed by me and considered in my medical decision making (see chart for details).  Rectal bleeding in patient who is 6 days post colonoscopy with polyp removal.  Old records are reviewed confirming colonoscopy which was significant for 3 polyps.  I doubt bleeding today is from the polyp resection 6 days ago.  He was noted to have diverticulosis, this is most likely the cause of his bleeding.  No report of AV malformations or angiodysplasia.  Screening labs are obtained.  Also, pathology report from his polyps shows tubular adenoma, which is potentially precancerous and will require ongoing screening colonoscopies.  Orthostatic vital signs showed no  significant drop in blood pressure or rise in heart rate.  Hemoglobin has come back at 13.2, which is a slight drop from July 10 when hemoglobin was 13.7.  He will be kept in the ED for 4hour hemoglobin.  Case is signed out to Dr. Lita Mains.  Final Clinical Impressions(s) / ED Diagnoses   Final diagnoses:  Rectal bleeding    ED Discharge Orders    None       Delora Fuel, MD 41/28/20 [phone]

## 2018-03-25 NOTE — ED Triage Notes (Signed)
Pt reports having colonoscopy six days ago and had 3 polyps removed. Pt denies pain. Reports bleeding started last night around midnight when he had BM. Pt says at first stool mixed in with bright red blood, then changed to just blood. Pt reports 4 BM since midnight.

## 2018-03-25 NOTE — ED Provider Notes (Signed)
Patient continues to have bloody bowel movements though hemoglobin is stable.  Discussed with Dr. Arnoldo Morale.  Will evaluate patient in the emergency department.   Julianne Rice, MD 03/25/18 [phone]

## 2018-03-25 NOTE — H&P (Signed)
Jerry Boyer is an 72 y.o. male.   Chief Complaint: Rectal bleeding HPI: Patient is a 72 year old white male status post colonoscopy with polypectomy on 03/19/2018 who presents to the emergency room with several episodes of blood per rectum.  He was doing well at home and has had normal bowel movements since the colonoscopy.  Last night, he started having bright red blood per rectum.  This turned the toilet bowl red.  As it continued, the patient presented to the emergency room.  He did feel little lightheaded.  He denies any abdominal pain, nausea, vomiting.  3 polyps were removed from his: 6 days ago as well as diverticulosis seen scattered throughout the colon.  He currently has no pain.  Past Medical History:  Diagnosis Date  . Anxiety   . Arthritis   . BPH (benign prostatic hyperplasia)   . Elevated troponin 01/30/2018   a. 01/2018: Type 2 NSTEMI most consistent with demand ischemia in the setting of Urosepsis.   . Hypercholesteremia   . Hypertension     Past Surgical History:  Procedure Laterality Date  . BACK SURGERY     lumbar disc  . CATARACT EXTRACTION W/PHACO Right 02/05/2017   Procedure: CATARACT EXTRACTION PHACO AND INTRAOCULAR LENS PLACEMENT (IOC);  Surgeon: Tonny Branch, MD;  Location: [facility];  Service: Ophthalmology;  Laterality: Right;  CDE: 7.36  . CATARACT EXTRACTION W/PHACO Left 02/19/2017   Procedure: CATARACT EXTRACTION PHACO AND INTRAOCULAR LENS PLACEMENT LEFT EYE;  Surgeon: Tonny Branch, MD;  Location: [facility];  Service: Ophthalmology;  Laterality: Left;  CDE: 7.98  . COLONOSCOPY    . COLONOSCOPY N/A 03/19/2018   Procedure: COLONOSCOPY;  Surgeon: Aviva Signs, MD;  Location: [facility];  Service: Gastroenterology;  Laterality: N/A;  . INGUINAL HERNIA REPAIR Right 12/14/2017   Procedure: HERNIA REPAIR INGUINAL ADULT WITH MESH;  Surgeon: Aviva Signs, MD;  Location: [facility];  Service: General;  Laterality: Right;  . JOINT REPLACEMENT Left   . POLYPECTOMY  03/19/2018   Procedure: POLYPECTOMY;  Surgeon: Aviva Signs, MD;  [facility];  Service: Gastroenterology;;  . UMBILICAL HERNIA REPAIR N/A 12/14/2017   Procedure: HERNIA REPAIR UMBILICAL ADULT WITH MESH;  Surgeon: Aviva Signs, MD;  Location: [facility];  Service: General;  Laterality: N/A;    Family History  Problem Relation Age of Onset  . Hypertension Mother   . Cancer Father   . Colon cancer Neg Hx    Social History:  reports that he quit smoking about 17 years ago. His smoking use included cigarettes. He has a 20.00 pack-year smoking history. He has never used smokeless tobacco. He reports that he drinks about 21.0 standard drinks of alcohol per week. He reports that he does not use drugs.  Allergies: No Known Allergies   (Not in a hospital admission)  Results for orders placed or performed during the hospital encounter of 03/25/18 (from the past 48 hour(s))  Comprehensive metabolic panel     Status: Abnormal   Collection Time: 03/25/18  5:16 AM  Result Value Ref Range   Sodium 140 135 - 145 mmol/L   Potassium 4.3 3.5 - 5.1 mmol/L   Chloride 107 98 - 111 mmol/L   CO2 21 (L) 22 - 32 mmol/L   Glucose, Bld 127 (H) 70 - 99 mg/dL   BUN 20 8 - 23 mg/dL   Creatinine, Ser 0.86 0.61 - 1.24 mg/dL   Calcium 9.2 8.9 - 10.3 mg/dL   Total Protein 6.5 6.5 -  8.1 g/dL   Albumin 3.7 3.5 - 5.0 g/dL   AST 24 15 - 41 U/L   ALT 26 0 - 44 U/L   Alkaline Phosphatase 72 38 - 126 U/L   Total Bilirubin 0.8 0.3 - 1.2 mg/dL   GFR calc non Af Amer >60 >60 mL/min   GFR calc Af Amer >60 >60 mL/min    Comment: (NOTE) The eGFR has been calculated using the CKD EPI equation. This calculation has not been validated in all clinical situations. eGFR's persistently <60 mL/min signify possible Chronic Kidney Disease.    Anion gap 12 5 - 15    Comment: Performed at [facility], [address], [place], [place] [address]  CBC with Differential     Status: None   Collection Time: 03/25/18  5:24 AM  Result  Value Ref Range   WBC 6.8 4.0 - 10.5 K/uL   RBC 4.23 4.22 - 5.81 MIL/uL   Hemoglobin 13.2 13.0 - 17.0 g/dL   HCT 39.6 39.0 - 52.0 %   MCV 93.6 78.0 - 100.0 fL   MCH 31.2 26.0 - 34.0 pg   MCHC 33.3 30.0 - 36.0 g/dL   RDW 14.4 11.5 - 15.5 %   Platelets 190 150 - 400 K/uL   Neutrophils Relative % 59 %   Lymphocytes Relative 25 %   Monocytes Relative 11 %   Eosinophils Relative 3 %   Basophils Relative 2 %   Band Neutrophils 0 %   Metamyelocytes Relative 0 %   Myelocytes 0 %   Promyelocytes Relative 0 %   Blasts 0 %   nRBC 0 0 /100 WBC   Neutro Abs 4.1 1.7 - 7.7 K/uL   Lymphs Abs 1.7 0.7 - 4.0 K/uL   Monocytes Absolute 0.7 0.1 - 1.0 K/uL   Eosinophils Absolute 0.2 0.0 - 0.7 K/uL   Basophils Absolute 0.1 0.0 - 0.1 K/uL    Comment: Performed at [facility], [address], [place], [place] [address]  POC occult blood, ED     Status: Abnormal   Collection Time: 03/25/18  5:36 AM  Result Value Ref Range   Fecal Occult Bld POSITIVE (A) NEGATIVE  Type and screen [facility]     Status: None   Collection Time: 03/25/18  6:21 AM  Result Value Ref Range   ABO/RH(D) O POS    Antibody Screen NEG    Sample Expiration      03/28/2018 Performed at [facility], [address], [place], [place] [address]   Hemoglobin and hematocrit, blood     Status: None   Collection Time: 03/25/18  8:40 AM  Result Value Ref Range   Hemoglobin 13.1 13.0 - 17.0 g/dL   HCT 39.8 39.0 - 52.0 %    Comment: Performed at [facility], [address], [place], [place] [address]   No results found.  Review of Systems  Constitutional: Negative.   HENT: Negative.   Eyes: Negative.   Respiratory: Negative.   Cardiovascular: Negative.   Genitourinary: Negative.   Musculoskeletal: Negative.   Skin: Negative.   Neurological: Negative.   Endo/Heme/Allergies: Negative.   Psychiatric/Behavioral: Negative.     Blood pressure 115/67, pulse 62, temperature (!) 97.1 F (36.2 C), temperature source Oral,  resp. rate 17, height 6' 1"  (1.854 m), weight 117.9 kg, SpO2 96 %. Physical Exam  Vitals reviewed. Constitutional: He is oriented to person, place, and time. He appears well-developed and well-nourished. No distress.  HENT:  Head:  Normocephalic and atraumatic.  Cardiovascular: Normal rate, regular rhythm and normal heart sounds. Exam reveals no gallop and no friction rub.  No murmur heard. Respiratory: Effort normal and breath sounds normal. No respiratory distress. He has no wheezes. He has no rales.  GI: Soft. Bowel sounds are normal. He exhibits no distension. There is no tenderness. There is no rebound and no guarding.  Neurological: He is alert and oriented to person, place, and time.  Skin: Skin is warm and dry.     Assessment/Plan Impression: Hematochezia, status post colonoscopy with polypectomy, diverticulosis.  Patient's vital signs are stable.  He does not require blood transfusion at this point. Plan: We will bring him to the hospital to monitor over the next 24 hours.  A repeat hematocrit will be drawn later on today.  No need for acute intervention at this time.  Patient understands and agrees.  Aviva Signs, MD 03/25/2018, 9:51 AM

## 2018-03-26 ENCOUNTER — Encounter (HOSPITAL_COMMUNITY): Payer: Self-pay | Admitting: *Deleted

## 2018-03-26 ENCOUNTER — Encounter (HOSPITAL_COMMUNITY)
Admission: EM | Disposition: A | Discharge status: Home / Self Care | Payer: Self-pay | Source: Home / Self Care | Attending: General Surgery

## 2018-03-26 DIAGNOSIS — K91840 Postprocedural hemorrhage and hematoma of a digestive system organ or structure following a digestive system procedure: Secondary | ICD-10-CM | POA: Diagnosis present

## 2018-03-26 DIAGNOSIS — Z91013 Allergy to seafood: Secondary | ICD-10-CM | POA: Diagnosis not present

## 2018-03-26 DIAGNOSIS — Z87891 Personal history of nicotine dependence: Secondary | ICD-10-CM | POA: Diagnosis not present

## 2018-03-26 DIAGNOSIS — I252 Old myocardial infarction: Secondary | ICD-10-CM | POA: Diagnosis not present

## 2018-03-26 DIAGNOSIS — K921 Melena: Secondary | ICD-10-CM | POA: Diagnosis present

## 2018-03-26 DIAGNOSIS — Z9841 Cataract extraction status, right eye: Secondary | ICD-10-CM | POA: Diagnosis not present

## 2018-03-26 DIAGNOSIS — Z961 Presence of intraocular lens: Secondary | ICD-10-CM | POA: Diagnosis present

## 2018-03-26 DIAGNOSIS — Y838 Other surgical procedures as the cause of abnormal reaction of the patient, or of later complication, without mention of misadventure at the time of the procedure: Secondary | ICD-10-CM | POA: Diagnosis present

## 2018-03-26 DIAGNOSIS — K625 Hemorrhage of anus and rectum: Secondary | ICD-10-CM | POA: Diagnosis present

## 2018-03-26 DIAGNOSIS — Z9842 Cataract extraction status, left eye: Secondary | ICD-10-CM | POA: Diagnosis not present

## 2018-03-26 DIAGNOSIS — K5790 Diverticulosis of intestine, part unspecified, without perforation or abscess without bleeding: Secondary | ICD-10-CM | POA: Diagnosis present

## 2018-03-26 HISTORY — PX: COLONOSCOPY: SHX5424

## 2018-03-26 LAB — CBC
HEMATOCRIT: 31.6 % — AB (ref 39.0–52.0)
Hemoglobin: 10.2 g/dL — ABNORMAL LOW (ref 13.0–17.0)
MCH: 31.2 pg (ref 26.0–34.0)
MCHC: 32.3 g/dL (ref 30.0–36.0)
MCV: 96.6 fL (ref 78.0–100.0)
PLATELETS: 166 10*3/uL (ref 150–400)
RBC: 3.27 MIL/uL — ABNORMAL LOW (ref 4.22–5.81)
RDW: 14.7 % (ref 11.5–15.5)
WBC: 5.7 10*3/uL (ref 4.0–10.5)

## 2018-03-26 LAB — HEMOGLOBIN AND HEMATOCRIT, BLOOD
HEMATOCRIT: 31.9 % — AB (ref 39.0–52.0)
Hemoglobin: 10.7 g/dL — ABNORMAL LOW (ref 13.0–17.0)

## 2018-03-26 SURGERY — COLONOSCOPY
Anesthesia: Moderate Sedation

## 2018-03-26 MED ORDER — MEPERIDINE HCL 50 MG/ML IJ SOLN
INTRAMUSCULAR | Status: AC
  Filled 2018-03-26: qty 1

## 2018-03-26 MED ORDER — MEPERIDINE HCL 50 MG/ML IJ SOLN
INTRAMUSCULAR | Status: DC | PRN
  Administered 2018-03-26: 50 mg via INTRAVENOUS

## 2018-03-26 MED ORDER — SODIUM CHLORIDE 0.9 % IV SOLN
INTRAVENOUS | Status: DC
  Administered 2018-03-26: 11:00:00 via INTRAVENOUS

## 2018-03-26 MED ORDER — DIPHENHYDRAMINE HCL 25 MG PO CAPS
25.0000 mg | ORAL_CAPSULE | Freq: Four times a day (QID) | ORAL | Status: DC | PRN
  Filled 2018-03-26: qty 1

## 2018-03-26 MED ORDER — LACTATED RINGERS IV SOLN
INTRAVENOUS | Status: DC
  Administered 2018-03-26: 16:00:00 via INTRAVENOUS

## 2018-03-26 MED ORDER — STERILE WATER FOR IRRIGATION IR SOLN
Status: DC | PRN
  Administered 2018-03-26: 1.5 mL

## 2018-03-26 MED ORDER — MIDAZOLAM HCL 5 MG/5ML IJ SOLN
INTRAMUSCULAR | Status: DC | PRN
  Administered 2018-03-26: 3 mg via INTRAVENOUS

## 2018-03-26 MED ORDER — MIDAZOLAM HCL 5 MG/5ML IJ SOLN
INTRAMUSCULAR | Status: AC
  Filled 2018-03-26: qty 10

## 2018-03-26 NOTE — Care Management Obs Status (Signed)
[facility] NOTIFICATION   Patient Details  Name: Jerry Boyer MRN: [id number] Date of Birth: 1946/04/02   Medicare Observation Status Notification Given:  Yes    Malakie Balis, Chauncey Reading, RN 03/26/2018, 10:29 AM

## 2018-03-26 NOTE — Op Note (Signed)
[facility] Patient Name: Jerry Boyer Procedure Date: 03/26/2018 11:53 AM MRN: [id number] Date of Birth: 1945-10-29 Attending MD: Aviva Signs , MD CSN: [id number] Age: 72 Admit Type: Inpatient Procedure:                Colonoscopy Indications:              Treatment of bleeding from polypectomy site Providers:                Aviva Signs, MD, Lurline Del, RN, Aram Candela Referring MD:              Medicines:                Midazolam 3 mg IV, Meperidine 50 mg IV Complications:            No immediate complications. Estimated blood loss:                            None. Estimated Blood Loss:     Estimated blood loss: none. Procedure:                Pre-Anesthesia Assessment:                           - Prior to the procedure, a History and Physical                            was performed, and patient medications and                            allergies were reviewed. The patient is competent.                            The risks and benefits of the procedure and the                            sedation options and risks were discussed with the                            patient. All questions were answered and informed                            consent was obtained. Patient identification and                            proposed procedure were verified by the physician,                            the nurse and the technician in the endoscopy                            suite. Mental Status Examination: alert and                            oriented. Airway Examination: normal oropharyngeal  airway and neck mobility. Respiratory Examination:                            clear to auscultation. CV Examination: RRR, no                            murmurs, no S3 or S4. Prophylactic Antibiotics: The                            patient does not require prophylactic antibiotics.                            Prior Anticoagulants: The patient has taken           aspirin. ASA Grade Assessment: II - A patient with                            mild systemic disease. After reviewing the risks                            and benefits, the patient was deemed in                            satisfactory condition to undergo the procedure.                            The anesthesia plan was to use moderate sedation /                            analgesia (conscious sedation). Immediately prior                            to administration of medications, the patient was                            re-assessed for adequacy to receive sedatives. The                            heart rate, respiratory rate, oxygen saturations,                            blood pressure, adequacy of pulmonary ventilation,                            and response to care were monitored throughout the                            procedure. The physical status of the patient was                            re-assessed after the procedure.                           After obtaining informed consent, the colonoscope  was passed under direct vision. Throughout the                            procedure, the patient's blood pressure, pulse, and                            oxygen saturations were monitored continuously. The                            CF-[id number] ([id number]) scope was introduced through                            the anus and advanced to the the cecum, identified                            by the appendiceal orifice, ileocecal valve and                            palpation. No anatomical landmarks were                            photographed. The entire colon was not well                            visualized. The patient tolerated the procedure                            well. The quality of the bowel preparation was                            fair. The total duration of the procedure was 29                            minutes. Scope In: 12:37:44 PM Scope  Out: 1:04:06 PM Scope Withdrawal Time: 0 hours 16 minutes 41 seconds  Total Procedure Duration: 0 hours 26 minutes 22 seconds  Findings:      The perianal and digital rectal examinations were normal.      Proximal colon colon with stool, no blood. Ascending polypectomy site       without bleeding. Old blood and stool in distal transverse colon and       descending colon. Sigmoid colon polypectomy site not active bleeding,       but stigmata of possible recent bleeding seen. Diverticulosis with clots       present surrounding site. Endoclips times two placed on polypectomy       site. No active bleeding noted at end of procedure.      The exam was otherwise without abnormality. Impression:               - Preparation of the colon was fair.                           - The examination was otherwise normal.                           -  No specimens collected.                           Bleeding most likely secondary to ASA and                            postpolypectomy bleed, diverticular disease Moderate Sedation:      Moderate (conscious) sedation was administered by the endoscopy nurse       and supervised by the endoscopist. The following parameters were       monitored: oxygen saturation, heart rate, blood pressure, and response       to care. Recommendation:           - Written discharge instructions were provided to                            the patient.                           - The signs and symptoms of potential delayed                            complications were discussed with the patient.                           - Patient has a contact number available for                            emergencies.                           - Return to normal activities tomorrow.                           - Resume previous diet.                           - No aspirin, ibuprofen, naproxen, or other                            non-steroidal anti-inflammatory drugs after polyp                             removal.                           - No repeat colonoscopy [Reason]. Procedure Code(s):        --- Professional ---                           [phone], Colonoscopy, flexible; with control of                            bleeding, any method Diagnosis Code(s):        --- Professional ---                           Y70.623, Postprocedural hemorrhage of  a digestive                            system organ or structure following a digestive                            system procedure CPT copyright 2017 [employer]. All rights reserved. The codes documented in this report are preliminary and upon coder review may  be revised to meet current compliance requirements. Aviva Signs, MD Aviva Signs, MD 03/26/2018 1:20:46 PM This report has been signed electronically. Number of Addenda: 0

## 2018-03-26 NOTE — H&P (View-Only) (Signed)
  Subjective: Patient denies any lightheadedness.  Still passing out per rectum.  Objective: Vital signs in last 24 hours: Temp:  [98.3 F (36.8 C)-98.7 F (37.1 C)] 98.3 F (36.8 C) (08/27 0616) Pulse Rate:  [60-72] 61 (08/27 0616) Resp:  [17-18] 18 (08/27 0616) BP: (105-137)/(52-68) 133/52 (08/27 0616) SpO2:  [94 %-100 %] 97 % (08/27 0616) Last BM Date: 03/25/18(bloody stools, MD aware)  Intake/Output from previous day: 08/26 0701 - 08/27 0700 In: 854.2 [I.V.:854.2] Out: -  Intake/Output this shift: No intake/output data recorded.  General appearance: alert, cooperative and no distress Resp: clear to auscultation bilaterally Cardio: regular rate and rhythm, S1, S2 normal, no murmur, click, rub or gallop GI: soft, non-tender; bowel sounds normal; no masses,  no organomegaly  Lab Results:  Recent Labs    03/25/18 0524  03/25/18 1532 03/25/18 2350  WBC 6.8  --   --  5.7  HGB 13.2   < > 11.2* 10.2*  HCT 39.6   < > 34.0* 31.6*  PLT 190  --   --  166   < > = values in this interval not displayed.   BMET Recent Labs    03/25/18 0516  NA 140  K 4.3  CL 107  CO2 21*  GLUCOSE 127*  BUN 20  CREATININE 0.86  CALCIUM 9.2   PT/INR No results for input(s): LABPROT, INR in the last 72 hours.  Studies/Results: No results found.  Anti-infectives: Anti-infectives (From admission, onward)   None      Assessment/Plan: Impression: Blood per rectum, question diverticular bleed versus post polypectomy bleeding. Plan: We will proceed with colonoscopy today.  The risks and benefits of the procedure were fully explained to the patient, who gave informed consent.  LOS: 0 days    Aviva Signs 03/26/2018

## 2018-03-26 NOTE — Interval H&P Note (Signed)
History and Physical Interval Note:  03/26/2018 12:36 PM  Jerry Boyer  has presented today for surgery, with the diagnosis of blood per rectum  The various methods of treatment have been discussed with the patient and family. After consideration of risks, benefits and other options for treatment, the patient has consented to  Procedure(s): COLONOSCOPY (N/A) as a surgical intervention .  The patient's history has been reviewed, patient examined, no change in status, stable for surgery.  I have reviewed the patient's chart and labs.  Questions were answered to the patient's satisfaction.     Aviva Signs

## 2018-03-26 NOTE — Progress Notes (Signed)
  Subjective: Patient denies any lightheadedness.  Still passing out per rectum.  Objective: Vital signs in last 24 hours: Temp:  [98.3 F (36.8 C)-98.7 F (37.1 C)] 98.3 F (36.8 C) (08/27 0616) Pulse Rate:  [60-72] 61 (08/27 0616) Resp:  [17-18] 18 (08/27 0616) BP: (105-137)/(52-68) 133/52 (08/27 0616) SpO2:  [94 %-100 %] 97 % (08/27 0616) Last BM Date: 03/25/18(bloody stools, MD aware)  Intake/Output from previous day: 08/26 0701 - 08/27 0700 In: 854.2 [I.V.:854.2] Out: -  Intake/Output this shift: No intake/output data recorded.  General appearance: alert, cooperative and no distress Resp: clear to auscultation bilaterally Cardio: regular rate and rhythm, S1, S2 normal, no murmur, click, rub or gallop GI: soft, non-tender; bowel sounds normal; no masses,  no organomegaly  Lab Results:  Recent Labs    03/25/18 0524  03/25/18 1532 03/25/18 2350  WBC 6.8  --   --  5.7  HGB 13.2   < > 11.2* 10.2*  HCT 39.6   < > 34.0* 31.6*  PLT 190  --   --  166   < > = values in this interval not displayed.   BMET Recent Labs    03/25/18 0516  NA 140  K 4.3  CL 107  CO2 21*  GLUCOSE 127*  BUN 20  CREATININE 0.86  CALCIUM 9.2   PT/INR No results for input(s): LABPROT, INR in the last 72 hours.  Studies/Results: No results found.  Anti-infectives: Anti-infectives (From admission, onward)   None      Assessment/Plan: Impression: Blood per rectum, question diverticular bleed versus post polypectomy bleeding. Plan: We will proceed with colonoscopy today.  The risks and benefits of the procedure were fully explained to the patient, who gave informed consent.  LOS: 0 days    Aviva Signs 03/26/2018

## 2018-03-27 LAB — CBC
HCT: 31.1 % — ABNORMAL LOW (ref 39.0–52.0)
Hemoglobin: 10.3 g/dL — ABNORMAL LOW (ref 13.0–17.0)
MCH: 31.6 pg (ref 26.0–34.0)
MCHC: 33.1 g/dL (ref 30.0–36.0)
MCV: 95.4 fL (ref 78.0–100.0)
PLATELETS: 163 10*3/uL (ref 150–400)
RBC: 3.26 MIL/uL — ABNORMAL LOW (ref 4.22–5.81)
RDW: 14.8 % (ref 11.5–15.5)
WBC: 5.8 10*3/uL (ref 4.0–10.5)

## 2018-03-27 NOTE — Discharge Summary (Signed)
Physician Discharge Summary  Patient ID: Jerry Boyer MRN: [id number] DOB/AGE: 1946-01-28 72 y.o.  Admit date: 03/25/2018 Discharge date: 03/27/2018  Admission Diagnoses: Blood per rectum, status post colonoscopy with polypectomy, diverticulosis  Discharge Diagnoses: Same Active Problems:   Blood in stool   Hemorrhage of colon following colonoscopy   Discharged Condition: good  Hospital Course: Patient is a 72 year old white male who was admitted to the hospital on 03/25/2018 for blood per rectum.  He is status post colonoscopy with multiple polypectomies 6 days earlier.  He was not lightheaded.  He was admitted to the hospital for further monitoring.  Over the next 24 hours, his hemoglobin did drift down, though he never required a blood transfusion.  His vital signs remained stable.  As he was still passing blood per rectum, he underwent a colonoscopy on 03/26/2018.  No Deontre spurting blood vessel was found.  Several titanium clips were placed over the sigmoid colon polypectomy site.  It was difficult to a certain whether he was bleeding from a diverticulum or a polypectomy site.  He tolerated the procedure well.  Over the next 24 hours, his hemoglobin remained stable with an hematocrit at 31.  His vital signs remained stable.  He is being discharged home on 03/27/2018 in good and improving condition.  Patient was instructed to stop his aspirin at this time.  Treatments: surgery: Colonoscopy with control of bleeding on 03/26/2018  Discharge Exam: Blood pressure (!) 126/54, pulse 64, temperature 98.3 F (36.8 C), temperature source Oral, resp. rate 20, height 6\' 1"  (1.854 m), weight 117.9 kg, SpO2 96 %. General appearance: alert, cooperative and no distress Resp: clear to auscultation bilaterally Cardio: regular rate and rhythm, S1, S2 normal, no murmur, click, rub or gallop GI: soft, non-tender; bowel sounds normal; no masses,  no organomegaly  Disposition: Discharge disposition: 01-Home  or Self Care       Discharge Instructions    Diet - low sodium heart healthy   Complete by:  As directed    Increase activity slowly   Complete by:  As directed      Allergies as of 03/27/2018      Reactions   Shrimp [shellfish Allergy] Anaphylaxis, Rash      Medication List    STOP taking these medications   aspirin EC 81 MG tablet     TAKE these medications   atorvastatin 40 MG tablet Commonly known as:  LIPITOR Take 40 mg by mouth every morning.   b complex vitamins tablet Take 1 tablet by mouth daily.   Bilberry 150 MG Caps Take 150 mg by mouth daily.   CENTRUM SILVER ADULT 50+ PO Take 1 tablet by mouth daily.   citalopram 20 MG tablet Commonly known as:  CELEXA Take 1 tablet (20 mg total) by mouth daily.   CoQ10 100 MG Caps Take 100 mg by mouth daily.   doxazosin 4 MG tablet Commonly known as:  CARDURA Take 4 mg by mouth at bedtime.   ezetimibe 10 MG tablet Commonly known as:  ZETIA Take 10 mg by mouth daily.   lisinopril 20 MG tablet Commonly known as:  PRINIVIL,ZESTRIL Take 20 mg by mouth daily.   meloxicam 7.5 MG tablet Commonly known as:  MOBIC Take 1 tablet (7.5 mg total) by mouth 2 (two) times daily as needed for pain.   OMEGA-3 FISH OIL PO Take 1 capsule by mouth daily.   vitamin C 500 MG tablet Commonly known as:  ASCORBIC ACID Take 500  mg by mouth daily.      Follow-up Information    Aviva Signs, MD Follow up.   Specialty:  General Surgery Why:  as needed Contact information: [address] [place] [address] [phone]           Signed: Aviva Signs 03/27/2018, 8:31 AM

## 2018-03-27 NOTE — Progress Notes (Signed)
Patient states understanding of discharge instructions.  

## 2018-03-27 NOTE — Discharge Instructions (Signed)
Colonoscopy, Adult, Care After  This sheet gives you information about how to care for yourself after your procedure. Your health care provider may also give you more specific instructions. If you have problems or questions, contact your health care provider.  What can I expect after the procedure?  After the procedure, it is common to have:  · A small amount of blood in your stool for 24 hours after the procedure.  · Some gas.  · Mild abdominal cramping or bloating.    Follow these instructions at home:  General instructions    · For the first 24 hours after the procedure:  ? Do not drive or use machinery.  ? Do not sign important documents.  ? Do not drink alcohol.  ? Do your regular daily activities at a slower pace than normal.  ? Eat soft, easy-to-digest foods.  ? Rest often.  · Take over-the-counter or prescription medicines only as told by your health care provider.  · It is up to you to get the results of your procedure. Ask your health care provider, or the department performing the procedure, when your results will be ready.  Relieving cramping and bloating  · Try walking around when you have cramps or feel bloated.  · Apply heat to your abdomen as told by your health care provider. Use a heat source that your health care provider recommends, such as a moist heat pack or a heating pad.  ? Place a towel between your skin and the heat source.  ? Leave the heat on for 20-30 minutes.  ? Remove the heat if your skin turns bright red. This is especially important if you are unable to feel pain, heat, or cold. You may have a greater risk of getting burned.  Eating and drinking  · Drink enough fluid to keep your urine clear or pale yellow.  · Resume your normal diet as instructed by your health care provider. Avoid heavy or fried foods that are hard to digest.  · Avoid drinking alcohol for as long as instructed by your health care provider.  Contact a health care provider if:  · You have blood in your stool 2-3  days after the procedure.  Get help right away if:  · You have more than a small spotting of blood in your stool.  · You pass large blood clots in your stool.  · Your abdomen is swollen.  · You have nausea or vomiting.  · You have a fever.  · You have increasing abdominal pain that is not relieved with medicine.  This information is not intended to replace advice given to you by your health care provider. Make sure you discuss any questions you have with your health care provider.  Document Released: 02/29/2004 Document Revised: 04/10/2016 Document Reviewed: 09/28/2015  Elsevier Interactive Patient Education © 2018 [employer]

## 2018-03-28 ENCOUNTER — Encounter (HOSPITAL_COMMUNITY): Payer: Self-pay | Admitting: General Surgery

## 2018-04-02 DIAGNOSIS — K922 Gastrointestinal hemorrhage, unspecified: Secondary | ICD-10-CM | POA: Diagnosis not present

## 2018-04-02 DIAGNOSIS — K635 Polyp of colon: Secondary | ICD-10-CM | POA: Diagnosis not present

## 2018-04-02 DIAGNOSIS — I10 Essential (primary) hypertension: Secondary | ICD-10-CM | POA: Diagnosis not present

## 2018-04-02 DIAGNOSIS — Z23 Encounter for immunization: Secondary | ICD-10-CM | POA: Diagnosis not present

## 2018-04-02 DIAGNOSIS — Z6838 Body mass index (BMI) 38.0-38.9, adult: Secondary | ICD-10-CM | POA: Diagnosis not present

## 2018-04-02 DIAGNOSIS — E782 Mixed hyperlipidemia: Secondary | ICD-10-CM | POA: Diagnosis not present

## 2018-05-23 ENCOUNTER — Encounter (INDEPENDENT_AMBULATORY_CARE_PROVIDER_SITE_OTHER): Payer: Self-pay | Admitting: Physical Medicine and Rehabilitation

## 2018-05-23 ENCOUNTER — Telehealth (INDEPENDENT_AMBULATORY_CARE_PROVIDER_SITE_OTHER): Payer: Self-pay | Admitting: *Deleted

## 2018-05-23 ENCOUNTER — Ambulatory Visit (INDEPENDENT_AMBULATORY_CARE_PROVIDER_SITE_OTHER): Payer: Medicare HMO | Admitting: Physical Medicine and Rehabilitation

## 2018-05-23 ENCOUNTER — Encounter

## 2018-05-23 VITALS — BP 122/63 | HR 61

## 2018-05-23 DIAGNOSIS — M4316 Spondylolisthesis, lumbar region: Secondary | ICD-10-CM

## 2018-05-23 DIAGNOSIS — G8929 Other chronic pain: Secondary | ICD-10-CM

## 2018-05-23 DIAGNOSIS — Q762 Congenital spondylolisthesis: Secondary | ICD-10-CM

## 2018-05-23 DIAGNOSIS — M5442 Lumbago with sciatica, left side: Secondary | ICD-10-CM

## 2018-05-23 DIAGNOSIS — M5416 Radiculopathy, lumbar region: Secondary | ICD-10-CM | POA: Diagnosis not present

## 2018-05-23 NOTE — Telephone Encounter (Signed)
Auth No / Request ID [id number] Status Auto-Approved Decision Approved Effective Date 05/27/2018 Expiration Date 07/11/2018

## 2018-05-23 NOTE — Progress Notes (Signed)
  Numeric Pain Rating Scale and Functional Assessment Average Pain 6 Pain Right Now 4 My pain is constant, tingling and aching Pain is worse with: walking Pain improves with: rest and sitting and Aleve   In the last MONTH (on 0-10 scale) has pain interfered with the following?  1. General activity like being  able to carry out your everyday physical activities such as walking, climbing stairs, carrying groceries, or moving a chair?  Rating(5)  2. Relation with others like being able to carry out your usual social activities and roles such as  activities at home, at work and in your community. Rating(5)  3. Enjoyment of life such that you have  been bothered by emotional problems such as feeling anxious, depressed or irritable?  Rating(5)

## 2018-06-03 ENCOUNTER — Encounter (INDEPENDENT_AMBULATORY_CARE_PROVIDER_SITE_OTHER): Payer: Self-pay | Admitting: Physical Medicine and Rehabilitation

## 2018-06-03 DIAGNOSIS — M4316 Spondylolisthesis, lumbar region: Secondary | ICD-10-CM | POA: Insufficient documentation

## 2018-06-03 DIAGNOSIS — Q762 Congenital spondylolisthesis: Secondary | ICD-10-CM | POA: Insufficient documentation

## 2018-06-03 NOTE — Progress Notes (Signed)
Jerry Boyer - 72 y.o. male MRN [id number]  Date of birth: 15-Apr-1946  Office Visit Note: Visit Date: 05/23/2018 PCP: Redmond School, MD Referred by: Redmond School, MD  Subjective: Chief Complaint  Patient presents with  . Lower Back - Pain  . Left Hip - Pain, Numbness   HPI: Jerry Boyer is a 71 y.o. male who comes in today For evaluation and management of his low back pain and left hip and leg pain.  He reports severe worsening left hip and lower back pain that is really been ongoing for years but over the last 4 to 5 months is progressively gotten worse.  He reports no specific injury.  He reports radiating symptoms on the lateral outside of the left thigh to about the knee and then some tingling in the left lower leg and more of an L5 distribution.  Worst of his symptoms is walking and standing.  Sitting relieves some of the pain.  He rates his pain as a 6 out of 10 and pretty severe and does limit what he can do on a daily basis.  He reports this constant tingling and aching pain overall.  He does use Aleve as tried other medications.  We have seen him in the past the last time I saw him was in 2017 and epidural injection did seem to help.  He does have an MRI from 2016 and this is reviewed with the patient and reviewed below.  He does have pars defects with listhesis of L5 on S1.  He had a higher level disc herniation as well without specific nerve compression at the time.  He describes no specific injury or red flag complaints of focal weakness or bowel bladder changes or night pain.  No real changes in his medical condition since I last saw him other than complications from colonoscopy and hospitalization this year for that..  No prior lumbar surgery.  He has had some pain that will flip-flop from right to left side.  Review of Systems  Constitutional: Negative for chills, fever, malaise/fatigue and weight loss.  HENT: Negative for hearing loss and sinus pain.   Eyes: Negative for  blurred vision, double vision and photophobia.  Respiratory: Negative for cough and shortness of breath.   Cardiovascular: Negative for chest pain, palpitations and leg swelling.  Gastrointestinal: Negative for abdominal pain, nausea and vomiting.  Genitourinary: Negative for flank pain.  Musculoskeletal: Positive for back pain and joint pain. Negative for myalgias.  Skin: Negative for itching and rash.  Neurological: Negative for tremors, focal weakness and weakness.  Endo/Heme/Allergies: Negative.   Psychiatric/Behavioral: Negative for depression.  All other systems reviewed and are negative.  Otherwise per HPI.  Assessment & Plan: Visit Diagnoses:  1. Lumbar radiculopathy   2. Spondylolisthesis of lumbar region   3. Congenital spondylolysis   4. Chronic bilateral low back pain with left-sided sciatica     Plan: Findings:  Chronic worsening severe left hip and leg pain consistent with combination of facet arthropathy and spondylolisthesis from bilateral pars defects as well as radiculitis radiculopathy on the left.  His referral pattern is consistent little bit with both.  In the past epidural injection has been beneficial.  He has been using anti-inflammatories and exercises from his physical therapy sessions over the last several months but this is progressively gotten worse.  I think is re-exacerbation of this condition.  He has no real red flag signs or symptoms.  I do not really feel like he  needed imaging today.  Prior MRI was reviewed.  The next step is diagnostic and hopefully therapeutic left L5 transforaminal epidural steroid injection.  Depending on results we will look at next step in terms of management.    Meds & Orders: No orders of the defined types were placed in this encounter.  No orders of the defined types were placed in this encounter.   Follow-up: Return for Left L5 transforaminal epidural steroid injection.   Procedures: No procedures performed  No notes on  file   Clinical History: EXAM: MRI LUMBAR SPINE WITHOUT CONTRAST  TECHNIQUE: Multiplanar, multisequence MR imaging of the lumbar spine was performed. No intravenous contrast was administered.  COMPARISON:  MRI lumbar spine 02/07/2012.  FINDINGS: Segmentation: 5 lumbar vertebral bodies are present.  Alignment: 5 mm anterolisthesis L5 on S1 related to pars defects. Minor scoliosis convex RIGHT upper lumbar region related to asymmetric loss of interspace height at L4-5 on the RIGHT.  Vertebrae: No worrisome osseous lesion.Moderate endplate reactive change of a degenerative nature at L2-3, L3-4, and L4-5.  Conus medullaris: Normal in size, signal, and location.  Paraspinal tissues: No evidence for hydronephrosis or paravertebral mass.  Disc levels:  L1-L2:  Mild bulge.  No impingement.  L2-L3: Central protrusion with BILATERAL foraminal extension. This is greater on the LEFT with LEFT-sided spurring. BILATERAL facet arthropathy. LEFT greater than RIGHT L2 and L3 nerve root impingement is possible.  L3-L4: Central protrusion. BILATERAL facet arthropathy. Mild foraminal and subarticular zone narrowing without clear-cut neural impingement, worse on the LEFT.  L4-L5: Disc space narrowing. Central disc osteophyte complex. Mild facet arthropathy. Postsurgical changes on the LEFT. No definite impingement.  L5-S1: 5 mm anterolisthesis. BILATERAL L5 spondylolysis. No definite subarticular zone or foraminal zone impingement. Compared with 2013, a similar appearance is noted.  IMPRESSION: Multilevel spondylosis as described.  Similar appearance to 2013.  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 Consider lateral standing flexion extension views to evaluate for dynamic instability.   Electronically Signed   By: Rolla Flatten M.D.   On: 11/18/2014 09:25   He reports that he quit smoking about 17 years ago. His smoking use included  cigarettes. He has a 20.00 pack-year smoking history. He has never used smokeless tobacco. No results for input(s): HGBA1C, LABURIC in the last 8760 hours.  Objective:  VS:  HT:    WT:   BMI:     BP:122/63  HR:61bpm  TEMP: ( )  RESP:  Physical Exam  Constitutional: He is oriented to person, place, and time. He appears well-developed and well-nourished. No distress.  HENT:  Head: Normocephalic and atraumatic.  Eyes: Pupils are equal, round, and reactive to light. Conjunctivae are normal.  Neck: Normal range of motion. Neck supple.  Cardiovascular: Regular rhythm and intact distal pulses.  Pulmonary/Chest: Effort normal. No respiratory distress.  Musculoskeletal:  Patient ambulates without aid.  He has some difficulty going from sit to full extension he does have pain with extension rotation and facet joint loading of the lumbar spine this is concordant with some of his low back pain.  No pain over his greater trochanters and no pain with hip rotation.  He has good distal strength without clonus.  Neurological: He is alert and oriented to person, place, and time. He exhibits normal muscle tone. Coordination normal.  Skin: Skin is warm and dry. No rash noted. No erythema.  Psychiatric: He has a normal mood and affect.  Nursing note and vitals reviewed.   Ortho Exam Imaging:  No results found.  Past Medical/Family/Surgical/Social History: Medications & Allergies reviewed per EMR, new medications updated. Patient Active Problem List   Diagnosis Date Noted  . 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 Spondylolisthesis of lumbar region 06/03/2018  . Hemorrhage of colon following colonoscopy   . Blood in stool 03/25/2018  . Rectal bleeding   . Special screening for malignant neoplasms, colon   . Benign neoplasm of ascending colon   . Benign neoplasm of descending colon   . Rectal polyp   . NSTEMI (non-ST elevated myocardial infarction) ([place]) 01/30/2018  . Sepsis ([place]) 01/29/2018  .  Acute lower UTI 01/29/2018  . HLD (hyperlipidemia) 01/29/2018  . HTN (hypertension) 01/29/2018  . Elevated troponin 01/29/2018  . Non-recurrent unilateral inguinal hernia without obstruction or gangrene   . Umbilical hernia without obstruction and without gangrene    Past Medical History:  Diagnosis Date  . Anxiety   . Arthritis   . BPH (benign prostatic hyperplasia)   . Elevated troponin 01/30/2018   a. 01/2018: Type 2 NSTEMI most consistent with demand ischemia in the setting of Urosepsis.   . Hypercholesteremia   . Hypertension    Family History  Problem Relation Age of Onset  . Hypertension Mother   . Cancer Father   . Colon cancer Neg Hx    Past Surgical History:  Procedure Laterality Date  . BACK SURGERY     lumbar disc  . CATARACT EXTRACTION W/PHACO Right 02/05/2017   Procedure: CATARACT EXTRACTION PHACO AND INTRAOCULAR LENS PLACEMENT (IOC);  Surgeon: Tonny Branch, MD;  Location: [facility];  Service: Ophthalmology;  Laterality: Right;  CDE: 7.36  . CATARACT EXTRACTION W/PHACO Left 02/19/2017   Procedure: CATARACT EXTRACTION PHACO AND INTRAOCULAR LENS PLACEMENT LEFT EYE;  Surgeon: Tonny Branch, MD;  Location: [facility];  Service: Ophthalmology;  Laterality: Left;  CDE: 7.98  . COLONOSCOPY    . COLONOSCOPY N/A 03/19/2018   Procedure: COLONOSCOPY;  Surgeon: Aviva Signs, MD;  Location: [facility];  Service: Gastroenterology;  Laterality: N/A;  . COLONOSCOPY N/A 03/26/2018   Procedure: COLONOSCOPY;  Surgeon: Aviva Signs, MD;  [facility];  Service: Gastroenterology;  Laterality: N/A;  . INGUINAL HERNIA REPAIR Right 12/14/2017   Procedure: HERNIA REPAIR INGUINAL ADULT WITH MESH;  Surgeon: Aviva Signs, MD;  Location: [facility];  Service: General;  Laterality: Right;  . JOINT REPLACEMENT Left   . POLYPECTOMY  03/19/2018   Procedure: POLYPECTOMY;  Surgeon: Aviva Signs, MD;  [facility]: [facility];  Service: Gastroenterology;;  . UMBILICAL HERNIA REPAIR N/A 12/14/2017    Procedure: HERNIA REPAIR UMBILICAL ADULT WITH MESH;  Surgeon: Aviva Signs, MD;  Location: [facility];  Service: General;  Laterality: N/A;   Social History   Occupational History  . Not on file  Tobacco Use  . Smoking status: Former Smoker    Packs/day: 1.00    Years: 20.00    Pack years: 20.00    Types: Cigarettes    Last attempt to quit: 01/29/2001    Years since quitting: 17.3  . Smokeless tobacco: Never Used  Substance and Sexual Activity  . Alcohol use: Yes    Alcohol/week: 21.0 standard drinks    Types: 21 Cans of beer per week    Comment: states "drinks beer about every day"  . Drug use: No  . Sexual activity: Yes    Birth control/protection: None

## 2018-06-06 ENCOUNTER — Encounter (INDEPENDENT_AMBULATORY_CARE_PROVIDER_SITE_OTHER): Payer: Self-pay | Admitting: Physical Medicine and Rehabilitation

## 2018-06-06 ENCOUNTER — Ambulatory Visit (INDEPENDENT_AMBULATORY_CARE_PROVIDER_SITE_OTHER): Payer: Self-pay

## 2018-06-06 ENCOUNTER — Ambulatory Visit (INDEPENDENT_AMBULATORY_CARE_PROVIDER_SITE_OTHER): Payer: Medicare HMO | Admitting: Physical Medicine and Rehabilitation

## 2018-06-06 VITALS — BP 154/78 | HR 62 | Temp 98.2°F

## 2018-06-06 DIAGNOSIS — M5416 Radiculopathy, lumbar region: Secondary | ICD-10-CM | POA: Diagnosis not present

## 2018-06-06 MED ORDER — BETAMETHASONE SOD PHOS & ACET 6 (3-3) MG/ML IJ SUSP
12.0000 mg | Freq: Once | INTRAMUSCULAR | Status: AC
  Administered 2018-06-06: 12 mg

## 2018-06-06 NOTE — Patient Instructions (Signed)

## 2018-06-06 NOTE — Progress Notes (Signed)
 .  Numeric Pain Rating Scale and Functional Assessment Average Pain 5   In the last MONTH (on 0-10 scale) has pain interfered with the following?  1. General activity like being  able to carry out your everyday physical activities such as walking, climbing stairs, carrying groceries, or moving a chair?  Rating(4)   +Driver, -BT, -Dye Allergies.  

## 2018-06-07 ENCOUNTER — Ambulatory Visit (INDEPENDENT_AMBULATORY_CARE_PROVIDER_SITE_OTHER): Payer: Medicare HMO | Admitting: Family Medicine

## 2018-06-07 ENCOUNTER — Encounter (INDEPENDENT_AMBULATORY_CARE_PROVIDER_SITE_OTHER): Payer: Self-pay | Admitting: Family Medicine

## 2018-06-07 ENCOUNTER — Ambulatory Visit (INDEPENDENT_AMBULATORY_CARE_PROVIDER_SITE_OTHER): Payer: Self-pay

## 2018-06-07 DIAGNOSIS — M25571 Pain in right ankle and joints of right foot: Secondary | ICD-10-CM | POA: Diagnosis not present

## 2018-06-07 MED ORDER — METHYLPREDNISOLONE ACETATE 40 MG/ML IJ SUSP
40.0000 mg | INTRAMUSCULAR | Status: AC | PRN
  Administered 2018-06-07: 40 mg via INTRA_ARTICULAR

## 2018-06-07 MED ORDER — LIDOCAINE HCL 1 % IJ SOLN
1.0000 mL | INTRAMUSCULAR | Status: AC | PRN
  Administered 2018-06-07: 1 mL

## 2018-06-07 NOTE — Patient Instructions (Signed)
   Glucosamine Sulfate:  1,000 mg twice daily  Turmeric:  500 mg twice daily   

## 2018-06-07 NOTE — Progress Notes (Signed)
Office Visit Note   Patient: Jerry Boyer           Date of Birth: 13-Feb-1946           MRN: [id number] Visit Date: 06/07/2018 Requested by: Redmond School, [place], [place] [address] PCP: Redmond School, MD  Subjective: Chief Complaint  Patient presents with  . Right Ankle - Pain    Pain in lateral ankle x at least 2 months. NKI.    HPI: He is a 72 year old with right ankle pain.  Symptoms started about 3 or 4 months ago, no injury.  He had an epidural injection yesterday for left-sided pain.  He has tried meloxicam for his right ankle with some improvement.  He cannot recall any major injuries in the past.  He has a history of left knee replacement for osteoarthritis.              ROS: Otherwise noncontributory  Objective: Vital Signs: There were no vitals taken for this visit.  Physical Exam:  Right ankle: No warmth or erythema.  Limited active range of motion.  He is tender along the anterior lateral ankle joint line.  Good strength with dorsiflexion, eversion, and plantar flexion against resistance.  Imaging: 3 view right ankle x-rays: Severe tibiotalar osteoarthritis with joint space narrowing and periarticular spurring.  Assessment & Plan: 1.  Right ankle pain most likely due to arthritis -Discussed various options with patient including different medications, over-the-counter supplements, ankle brace, physical therapy and injection.  He would like to try an injection area follow-up as needed.   Follow-Up Instructions: No follow-ups on file.      Procedures: Medium Joint Inj on 06/07/2018 10:31 AM Indications: pain Details: 25 G 1.5 in needle, ultrasound-guided anterolateral approach Medications: 1 mL lidocaine 1 %; 40 mg methylPREDNISolone acetate 40 MG/ML  He had good pain relief during the immediate anesthetic phase. Consent was given by the patient.      No notes on file    PMFS History: Patient Active Problem List   Diagnosis Date Noted  . 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 Spondylolisthesis of lumbar region 06/03/2018  . Hemorrhage of colon following colonoscopy   . Blood in stool 03/25/2018  . Rectal bleeding   . Special screening for malignant neoplasms, colon   . Benign neoplasm of ascending colon   . Benign neoplasm of descending colon   . Rectal polyp   . NSTEMI (non-ST elevated myocardial infarction) ([place]) 01/30/2018  . Sepsis ([place]) 01/29/2018  . Acute lower UTI 01/29/2018  . HLD (hyperlipidemia) 01/29/2018  . HTN (hypertension) 01/29/2018  . Elevated troponin 01/29/2018  . Non-recurrent unilateral inguinal hernia without obstruction or gangrene   . Umbilical hernia without obstruction and without gangrene    Past Medical History:  Diagnosis Date  . Anxiety   . Arthritis   . BPH (benign prostatic hyperplasia)   . Elevated troponin 01/30/2018   a. 01/2018: Type 2 NSTEMI most consistent with demand ischemia in the setting of Urosepsis.   . Hypercholesteremia   . Hypertension     Family History  Problem Relation Age of Onset  . Hypertension Mother   . Cancer Father   . Colon cancer Neg Hx     Past Surgical History:  Procedure Laterality Date  . BACK SURGERY     lumbar disc  . CATARACT EXTRACTION W/PHACO Right 02/05/2017   Procedure: CATARACT EXTRACTION PHACO AND INTRAOCULAR LENS PLACEMENT (IOC);  Surgeon: Tonny Branch, MD;  [facility];  Service: Ophthalmology;  Laterality: Right;  CDE: 7.36  . CATARACT EXTRACTION W/PHACO Left 02/19/2017   Procedure: CATARACT EXTRACTION PHACO AND INTRAOCULAR LENS PLACEMENT LEFT EYE;  Surgeon: Tonny Branch, MD;  Location: [facility];  Service: Ophthalmology;  Laterality: Left;  CDE: 7.98  . COLONOSCOPY    . COLONOSCOPY N/A 03/19/2018   Procedure: COLONOSCOPY;  Surgeon: Aviva Signs, MD;  [facility];  Service: Gastroenterology;  Laterality: N/A;  . COLONOSCOPY N/A 03/26/2018   Procedure: COLONOSCOPY;  Surgeon: Aviva Signs, MD;   [facility];  Service: Gastroenterology;  Laterality: N/A;  . INGUINAL HERNIA REPAIR Right 12/14/2017   Procedure: HERNIA REPAIR INGUINAL ADULT WITH MESH;  Surgeon: Aviva Signs, MD;  Location: [facility];  Service: General;  Laterality: Right;  . JOINT REPLACEMENT Left   . POLYPECTOMY  03/19/2018   Procedure: POLYPECTOMY;  Surgeon: Aviva Signs, MD;  [facility];  Service: Gastroenterology;;  . UMBILICAL HERNIA REPAIR N/A 12/14/2017   Procedure: HERNIA REPAIR UMBILICAL ADULT WITH MESH;  Surgeon: Aviva Signs, MD;  Location: [facility];  Service: General;  Laterality: N/A;   Social History   Occupational History  . Not on file  Tobacco Use  . Smoking status: Former Smoker    Packs/day: 1.00    Years: 20.00    Pack years: 20.00    Types: Cigarettes    Last attempt to quit: 01/29/2001    Years since quitting: 17.3  . Smokeless tobacco: Never Used  Substance and Sexual Activity  . Alcohol use: Yes    Alcohol/week: 21.0 standard drinks    Types: 21 Cans of beer per week    Comment: states "drinks beer about every day"  . Drug use: No  . Sexual activity: Yes    Birth control/protection: None

## 2018-06-21 NOTE — Procedures (Signed)
Lumbosacral Transforaminal Epidural Steroid Injection - Sub-Pedicular Approach with Fluoroscopic Guidance  Patient: Jerry Boyer      Date of Birth: 30-Jan-1946 MRN: [id number] PCP: Redmond School, MD      Visit Date: 06/06/2018   Universal Protocol:    Date/Time: 06/06/2018  Consent Given By: the patient  Position: PRONE  Additional Comments: Vital signs were monitored before and after the procedure. Patient was prepped and draped in the usual sterile fashion. The correct patient, procedure, and site was verified.   Injection Procedure Details:  Procedure Site One Meds Administered:  Meds ordered this encounter  Medications  . betamethasone acetate-betamethasone sodium phosphate (CELESTONE) injection 12 mg    Laterality: Left  Location/Site:  L5-S1  Needle size: 22 G  Needle type: Spinal  Needle Placement: Transforaminal  Findings:    -Comments: Excellent flow of contrast along the nerve and into the epidural space.  Procedure Details: After squaring off the end-plates to get a true AP view, the C-arm was positioned so that an oblique view of the foramen as noted above was visualized. The target area is just inferior to the "nose of the scotty dog" or sub pedicular. The soft tissues overlying this structure were infiltrated with 2-3 ml. of 1% Lidocaine without Epinephrine.  The spinal needle was inserted toward the target using a "trajectory" view along the fluoroscope beam.  Under AP and lateral visualization, the needle was advanced so it did not puncture dura and was located close the 6 O'Clock position of the pedical in AP tracterory. Biplanar projections were used to confirm position. Aspiration was confirmed to be negative for CSF and/or blood. A 1-2 ml. volume of Isovue-250 was injected and flow of contrast was noted at each level. Radiographs were obtained for documentation purposes.   After attaining the desired flow of contrast documented above, a 0.5 to  1.0 ml test dose of 0.25% Marcaine was injected into each respective transforaminal space.  The patient was observed for 90 seconds post injection.  After no sensory deficits were reported, and normal lower extremity motor function was noted,   the above injectate was administered so that equal amounts of the injectate were placed at each foramen (level) into the transforaminal epidural space.   Additional Comments:  The patient tolerated the procedure well Dressing: Band-Aid    Post-procedure details: Patient was observed during the procedure. Post-procedure instructions were reviewed.  Patient left the clinic in stable condition.

## 2018-06-21 NOTE — Progress Notes (Signed)
Jerry Boyer - 72 y.o. male MRN [id number]  Date of birth: 1946-03-21  Office Visit Note: Visit Date: 06/06/2018 PCP: Redmond School, MD Referred by: Redmond School, MD  Subjective: Chief Complaint  Patient presents with  . Lower Back - Pain  . Left Leg - Tingling   HPI:  Jerry Boyer is a 72 y.o. male who comes in today For planned left L5 transforaminal epidural steroid injection diagnostically hopefully therapeutically.  Please see our prior evaluation and management note for further details and justification.  Patient has pars defects with listhesis and left radicular pain.  Prior injections in the remote past of helped.  ROS Otherwise per HPI.  Assessment & Plan: Visit Diagnoses:  1. Lumbar radiculopathy     Plan: No additional findings.   Meds & Orders:  Meds ordered this encounter  Medications  . betamethasone acetate-betamethasone sodium phosphate (CELESTONE) injection 12 mg    Orders Placed This Encounter  Procedures  . XR C-ARM NO REPORT  . Epidural Steroid injection    Follow-up: Return for Dr. Junius Roads for right ankle pain.   Procedures: No procedures performed  Lumbosacral Transforaminal Epidural Steroid Injection - Sub-Pedicular Approach with Fluoroscopic Guidance  Patient: Jerry Boyer      Date of Birth: May 14, 1946 MRN: [id number] PCP: Redmond School, MD      Visit Date: 06/06/2018   Universal Protocol:    Date/Time: 06/06/2018  Consent Given By: the patient  Position: PRONE  Additional Comments: Vital signs were monitored before and after the procedure. Patient was prepped and draped in the usual sterile fashion. The correct patient, procedure, and site was verified.   Injection Procedure Details:  Procedure Site One Meds Administered:  Meds ordered this encounter  Medications  . betamethasone acetate-betamethasone sodium phosphate (CELESTONE) injection 12 mg    Laterality: Left  Location/Site:  L5-S1  Needle size: 22  G  Needle type: Spinal  Needle Placement: Transforaminal  Findings:    -Comments: Excellent flow of contrast along the nerve and into the epidural space.  Procedure Details: After squaring off the end-plates to get a true AP view, the C-arm was positioned so that an oblique view of the foramen as noted above was visualized. The target area is just inferior to the "nose of the scotty dog" or sub pedicular. The soft tissues overlying this structure were infiltrated with 2-3 ml. of 1% Lidocaine without Epinephrine.  The spinal needle was inserted toward the target using a "trajectory" view along the fluoroscope beam.  Under AP and lateral visualization, the needle was advanced so it did not puncture dura and was located close the 6 O'Clock position of the pedical in AP tracterory. Biplanar projections were used to confirm position. Aspiration was confirmed to be negative for CSF and/or blood. A 1-2 ml. volume of Isovue-250 was injected and flow of contrast was noted at each level. Radiographs were obtained for documentation purposes.   After attaining the desired flow of contrast documented above, a 0.5 to 1.0 ml test dose of 0.25% Marcaine was injected into each respective transforaminal space.  The patient was observed for 90 seconds post injection.  After no sensory deficits were reported, and normal lower extremity motor function was noted,   the above injectate was administered so that equal amounts of the injectate were placed at each foramen (level) into the transforaminal epidural space.   Additional Comments:  The patient tolerated the procedure well Dressing: Band-Aid    Post-procedure details: Patient was  observed during the procedure. Post-procedure instructions were reviewed.  Patient left the clinic in stable condition.    Clinical History: EXAM: MRI LUMBAR SPINE WITHOUT CONTRAST  TECHNIQUE: Multiplanar, multisequence MR imaging of the lumbar spine was performed. No  intravenous contrast was administered.  COMPARISON:  MRI lumbar spine 02/07/2012.  FINDINGS: Segmentation: 5 lumbar vertebral bodies are present.  Alignment: 5 mm anterolisthesis L5 on S1 related to pars defects. Minor scoliosis convex RIGHT upper lumbar region related to asymmetric loss of interspace height at L4-5 on the RIGHT.  Vertebrae: No worrisome osseous lesion.Moderate endplate reactive change of a degenerative nature at L2-3, L3-4, and L4-5.  Conus medullaris: Normal in size, signal, and location.  Paraspinal tissues: No evidence for hydronephrosis or paravertebral mass.  Disc levels:  L1-L2:  Mild bulge.  No impingement.  L2-L3: Central protrusion with BILATERAL foraminal extension. This is greater on the LEFT with LEFT-sided spurring. BILATERAL facet arthropathy. LEFT greater than RIGHT L2 and L3 nerve root impingement is possible.  L3-L4: Central protrusion. BILATERAL facet arthropathy. Mild foraminal and subarticular zone narrowing without clear-cut neural impingement, worse on the LEFT.  L4-L5: Disc space narrowing. Central disc osteophyte complex. Mild facet arthropathy. Postsurgical changes on the LEFT. No definite impingement.  L5-S1: 5 mm anterolisthesis. BILATERAL L5 spondylolysis. No definite subarticular zone or foraminal zone impingement. Compared with 2013, a similar appearance is noted.  IMPRESSION: Multilevel spondylosis as described.  Similar appearance to 2013.  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 Consider lateral standing flexion extension views to evaluate for dynamic instability.   Electronically Signed   By: Rolla Flatten M.D.   On: 11/18/2014 09:25     Objective:  VS:  HT:    WT:   BMI:     BP:(!) 154/78  HR:62bpm  TEMP:98.2 F (36.8 C)(Oral)  RESP:  Physical Exam  Ortho Exam Imaging: No results found.

## 2018-07-18 ENCOUNTER — Telehealth (INDEPENDENT_AMBULATORY_CARE_PROVIDER_SITE_OTHER): Payer: Self-pay | Admitting: Physical Medicine and Rehabilitation

## 2018-07-18 NOTE — Telephone Encounter (Signed)
If helped but not enough then ok to repeat, if mostly back and not leg then facet at L5-S1. If somewhere in bwtween then let me kno

## 2018-07-18 NOTE — Telephone Encounter (Signed)
Needs auth and scheduling for repeat [phone] left L5 TF.

## 2018-07-19 NOTE — Telephone Encounter (Signed)
Auth No / Request ID [id number] Status Auto-Approved Decision Approved Effective Date 07/22/2018 Expiration Date 09/05/2018

## 2018-07-19 NOTE — Telephone Encounter (Signed)
Pt is scheduled for 08/09/18 with driver.

## 2018-07-30 ENCOUNTER — Telehealth (INDEPENDENT_AMBULATORY_CARE_PROVIDER_SITE_OTHER): Payer: Self-pay | Admitting: Physical Medicine and Rehabilitation

## 2018-08-01 NOTE — Telephone Encounter (Signed)
Called pt and advised.  

## 2018-08-01 NOTE — Telephone Encounter (Signed)
Notification or Prior Authorization is not required for the requested services  This [employer]vantage members plan does not currently require a prior authorization for [phone].  Decision ID #:[id number]

## 2018-08-09 ENCOUNTER — Ambulatory Visit (INDEPENDENT_AMBULATORY_CARE_PROVIDER_SITE_OTHER): Payer: Self-pay

## 2018-08-09 ENCOUNTER — Encounter (INDEPENDENT_AMBULATORY_CARE_PROVIDER_SITE_OTHER): Payer: Self-pay | Admitting: Physical Medicine and Rehabilitation

## 2018-08-09 ENCOUNTER — Ambulatory Visit (INDEPENDENT_AMBULATORY_CARE_PROVIDER_SITE_OTHER): Payer: Medicare Other | Admitting: Physical Medicine and Rehabilitation

## 2018-08-09 VITALS — BP 163/86 | HR 59 | Temp 98.0°F

## 2018-08-09 DIAGNOSIS — M4316 Spondylolisthesis, lumbar region: Secondary | ICD-10-CM | POA: Diagnosis not present

## 2018-08-09 DIAGNOSIS — Q762 Congenital spondylolisthesis: Secondary | ICD-10-CM | POA: Diagnosis not present

## 2018-08-09 DIAGNOSIS — M5416 Radiculopathy, lumbar region: Secondary | ICD-10-CM | POA: Diagnosis not present

## 2018-08-09 DIAGNOSIS — M961 Postlaminectomy syndrome, not elsewhere classified: Secondary | ICD-10-CM

## 2018-08-09 MED ORDER — BETAMETHASONE SOD PHOS & ACET 6 (3-3) MG/ML IJ SUSP
12.0000 mg | Freq: Once | INTRAMUSCULAR | Status: AC
  Administered 2018-08-09: 12 mg

## 2018-08-09 NOTE — Patient Instructions (Signed)

## 2018-08-09 NOTE — Progress Notes (Signed)
 .  Numeric Pain Rating Scale and Functional Assessment Average Pain 7   In the last MONTH (on 0-10 scale) has pain interfered with the following?  1. General activity like being  able to carry out your everyday physical activities such as walking, climbing stairs, carrying groceries, or moving a chair?  Rating(6)   +Driver, -BT, -Dye Allergies.  

## 2018-08-12 NOTE — Progress Notes (Signed)
Jerry Boyer - 73 y.o. male MRN [id number]  Date of birth: 02/28/1946  Office Visit Note: Visit Date: 08/09/2018 PCP: Redmond School, MD Referred by: Redmond School, MD  Subjective: Chief Complaint  Patient presents with  . Lower Back - Pain  . Left Leg - Pain   HPI:  Jerry Boyer is a 73 y.o. male who comes in today Status post left L5 transforaminal epidural steroid injection that gave him 75% relief for 2 weeks then the pain returned.  He average pain is 7 out of 10.  He is getting pretty classic radicular symptoms down the left leg in an S1 distribution posterior.  He does have listhesis of L5 on S1 with likely prior pars defects.  He has had prior lumbar laminectomy discectomies in the remote past.  I think at this point given the fact that it helped but did not help very long only try an S1 transforaminal injection with him.  We discussed this at length and he does want to proceed with that.  This would be diagnostic.  ROS Otherwise per HPI.  Assessment & Plan: Visit Diagnoses:  1. Lumbar radiculopathy   2. Congenital spondylolysis   3. Spondylolisthesis of lumbar region   4. Post laminectomy syndrome     Plan: No additional findings.   Meds & Orders:  Meds ordered this encounter  Medications  . betamethasone acetate-betamethasone sodium phosphate (CELESTONE) injection 12 mg    Orders Placed This Encounter  Procedures  . XR C-ARM NO REPORT  . Epidural Steroid injection    Follow-up: Return if symptoms worsen or fail to improve.   Procedures: No procedures performed  S1 Lumbosacral Transforaminal Epidural Steroid Injection - Sub-Pedicular Approach with Fluoroscopic Guidance   Patient: Jerry Boyer      Date of Birth: 01-21-1946 MRN: [id number] PCP: Redmond School, MD      Visit Date: 08/09/2018   Universal Protocol:    Date/Time: 01/13/205:37 AM  Consent Given By: the patient  Position:  PRONE  Additional Comments: Vital signs were monitored  before and after the procedure. Patient was prepped and draped in the usual sterile fashion. The correct patient, procedure, and site was verified.   Injection Procedure Details:  Procedure Site One Meds Administered:  Meds ordered this encounter  Medications  . betamethasone acetate-betamethasone sodium phosphate (CELESTONE) injection 12 mg    Laterality: Left  Location/Site:  S1 Foramen   Needle size: 22 ga.  Needle type: Spinal  Needle Placement: Transforaminal  Findings:   -Comments: Excellent flow of contrast along the nerve and into the epidural space.  Procedure Details: After squaring off the sacral end-plate to get a true AP view, the C-arm was positioned so that the best possible view of the S1 foramen was visualized. The soft tissues overlying this structure were infiltrated with 2-3 ml. of 1% Lidocaine without Epinephrine.    The spinal needle was inserted toward the target using a "trajectory" view along the fluoroscope beam.  Under AP and lateral visualization, the needle was advanced so it did not puncture dura. Biplanar projections were used to confirm position. Aspiration was confirmed to be negative for CSF and/or blood. A 1-2 ml. volume of Isovue-250 was injected and flow of contrast was noted at each level. Radiographs were obtained for documentation purposes.   After attaining the desired flow of contrast documented above, a 0.5 to 1.0 ml test dose of 0.25% Marcaine was injected into each respective transforaminal space.  The patient  was observed for 90 seconds post injection.  After no sensory deficits were reported, and normal lower extremity motor function was noted,   the above injectate was administered so that equal amounts of the injectate were placed at each foramen (level) into the transforaminal epidural space.   Additional Comments:  The patient tolerated the procedure well Dressing: Band-Aid    Post-procedure details: Patient was observed  during the procedure. Post-procedure instructions were reviewed.  Patient left the clinic in stable condition.    Clinical History: EXAM: MRI LUMBAR SPINE WITHOUT CONTRAST  TECHNIQUE: Multiplanar, multisequence MR imaging of the lumbar spine was performed. No intravenous contrast was administered.  COMPARISON:  MRI lumbar spine 02/07/2012.  FINDINGS: Segmentation: 5 lumbar vertebral bodies are present.  Alignment: 5 mm anterolisthesis L5 on S1 related to pars defects. Minor scoliosis convex RIGHT upper lumbar region related to asymmetric loss of interspace height at L4-5 on the RIGHT.  Vertebrae: No worrisome osseous lesion.Moderate endplate reactive change of a degenerative nature at L2-3, L3-4, and L4-5.  Conus medullaris: Normal in size, signal, and location.  Paraspinal tissues: No evidence for hydronephrosis or paravertebral mass.  Disc levels:  L1-L2:  Mild bulge.  No impingement.  L2-L3: Central protrusion with BILATERAL foraminal extension. This is greater on the LEFT with LEFT-sided spurring. BILATERAL facet arthropathy. LEFT greater than RIGHT L2 and L3 nerve root impingement is possible.  L3-L4: Central protrusion. BILATERAL facet arthropathy. Mild foraminal and subarticular zone narrowing without clear-cut neural impingement, worse on the LEFT.  L4-L5: Disc space narrowing. Central disc osteophyte complex. Mild facet arthropathy. Postsurgical changes on the LEFT. No definite impingement.  L5-S1: 5 mm anterolisthesis. BILATERAL L5 spondylolysis. No definite subarticular zone or foraminal zone impingement. Compared with 2013, a similar appearance is noted.  IMPRESSION: Multilevel spondylosis as described.  Similar appearance to 2013.  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 Consider lateral standing flexion extension views to evaluate for dynamic instability.   Electronically Signed   By: Rolla Flatten M.D.    On: 11/18/2014 09:25     Objective:  VS:  HT:    WT:   BMI:     BP:(!) 163/86  HR:(!) 59bpm  TEMP:98 F (36.7 C)(Oral)  RESP:  Physical Exam  Ortho Exam Imaging: No results found.

## 2018-08-12 NOTE — Procedures (Signed)
S1 Lumbosacral Transforaminal Epidural Steroid Injection - Sub-Pedicular Approach with Fluoroscopic Guidance   Patient: Jerry Boyer      Date of Birth: Sep 27, 1945 MRN: [id number] PCP: Redmond School, MD      Visit Date: 08/09/2018   Universal Protocol:    Date/Time: 01/13/205:37 AM  Consent Given By: the patient  Position:  PRONE  Additional Comments: Vital signs were monitored before and after the procedure. Patient was prepped and draped in the usual sterile fashion. The correct patient, procedure, and site was verified.   Injection Procedure Details:  Procedure Site One Meds Administered:  Meds ordered this encounter  Medications  . betamethasone acetate-betamethasone sodium phosphate (CELESTONE) injection 12 mg    Laterality: Left  Location/Site:  S1 Foramen   Needle size: 22 ga.  Needle type: Spinal  Needle Placement: Transforaminal  Findings:   -Comments: Excellent flow of contrast along the nerve and into the epidural space.  Procedure Details: After squaring off the sacral end-plate to get a true AP view, the C-arm was positioned so that the best possible view of the S1 foramen was visualized. The soft tissues overlying this structure were infiltrated with 2-3 ml. of 1% Lidocaine without Epinephrine.    The spinal needle was inserted toward the target using a "trajectory" view along the fluoroscope beam.  Under AP and lateral visualization, the needle was advanced so it did not puncture dura. Biplanar projections were used to confirm position. Aspiration was confirmed to be negative for CSF and/or blood. A 1-2 ml. volume of Isovue-250 was injected and flow of contrast was noted at each level. Radiographs were obtained for documentation purposes.   After attaining the desired flow of contrast documented above, a 0.5 to 1.0 ml test dose of 0.25% Marcaine was injected into each respective transforaminal space.  The patient was observed for 90 seconds post  injection.  After no sensory deficits were reported, and normal lower extremity motor function was noted,   the above injectate was administered so that equal amounts of the injectate were placed at each foramen (level) into the transforaminal epidural space.   Additional Comments:  The patient tolerated the procedure well Dressing: Band-Aid    Post-procedure details: Patient was observed during the procedure. Post-procedure instructions were reviewed.  Patient left the clinic in stable condition.

## 2018-08-21 DIAGNOSIS — Z1389 Encounter for screening for other disorder: Secondary | ICD-10-CM | POA: Diagnosis not present

## 2018-08-21 DIAGNOSIS — N342 Other urethritis: Secondary | ICD-10-CM | POA: Diagnosis not present

## 2018-08-21 DIAGNOSIS — R300 Dysuria: Secondary | ICD-10-CM | POA: Diagnosis not present

## 2018-10-08 ENCOUNTER — Other Ambulatory Visit (HOSPITAL_COMMUNITY)
Admission: AD | Admit: 2018-10-08 | Discharge: 2018-10-08 | Disposition: A | Discharge status: Home / Self Care | Payer: Medicare Other | Source: Other Acute Inpatient Hospital | Attending: Internal Medicine | Admitting: Internal Medicine

## 2018-10-08 ENCOUNTER — Ambulatory Visit: Payer: Medicare Other | Admitting: Urology

## 2018-10-08 DIAGNOSIS — N3000 Acute cystitis without hematuria: Secondary | ICD-10-CM

## 2018-10-08 DIAGNOSIS — R972 Elevated prostate specific antigen [PSA]: Secondary | ICD-10-CM

## 2018-10-11 LAB — URINE CULTURE

## 2018-10-22 IMAGING — DX DG CHEST 2V
2 series · 2 of 2 positions shown · non-contrast
Comparison: October 17, 2013

CLINICAL DATA: Cough and nausea.  Fever.

EXAM:
CHEST - 2 VIEW

[chest lat]
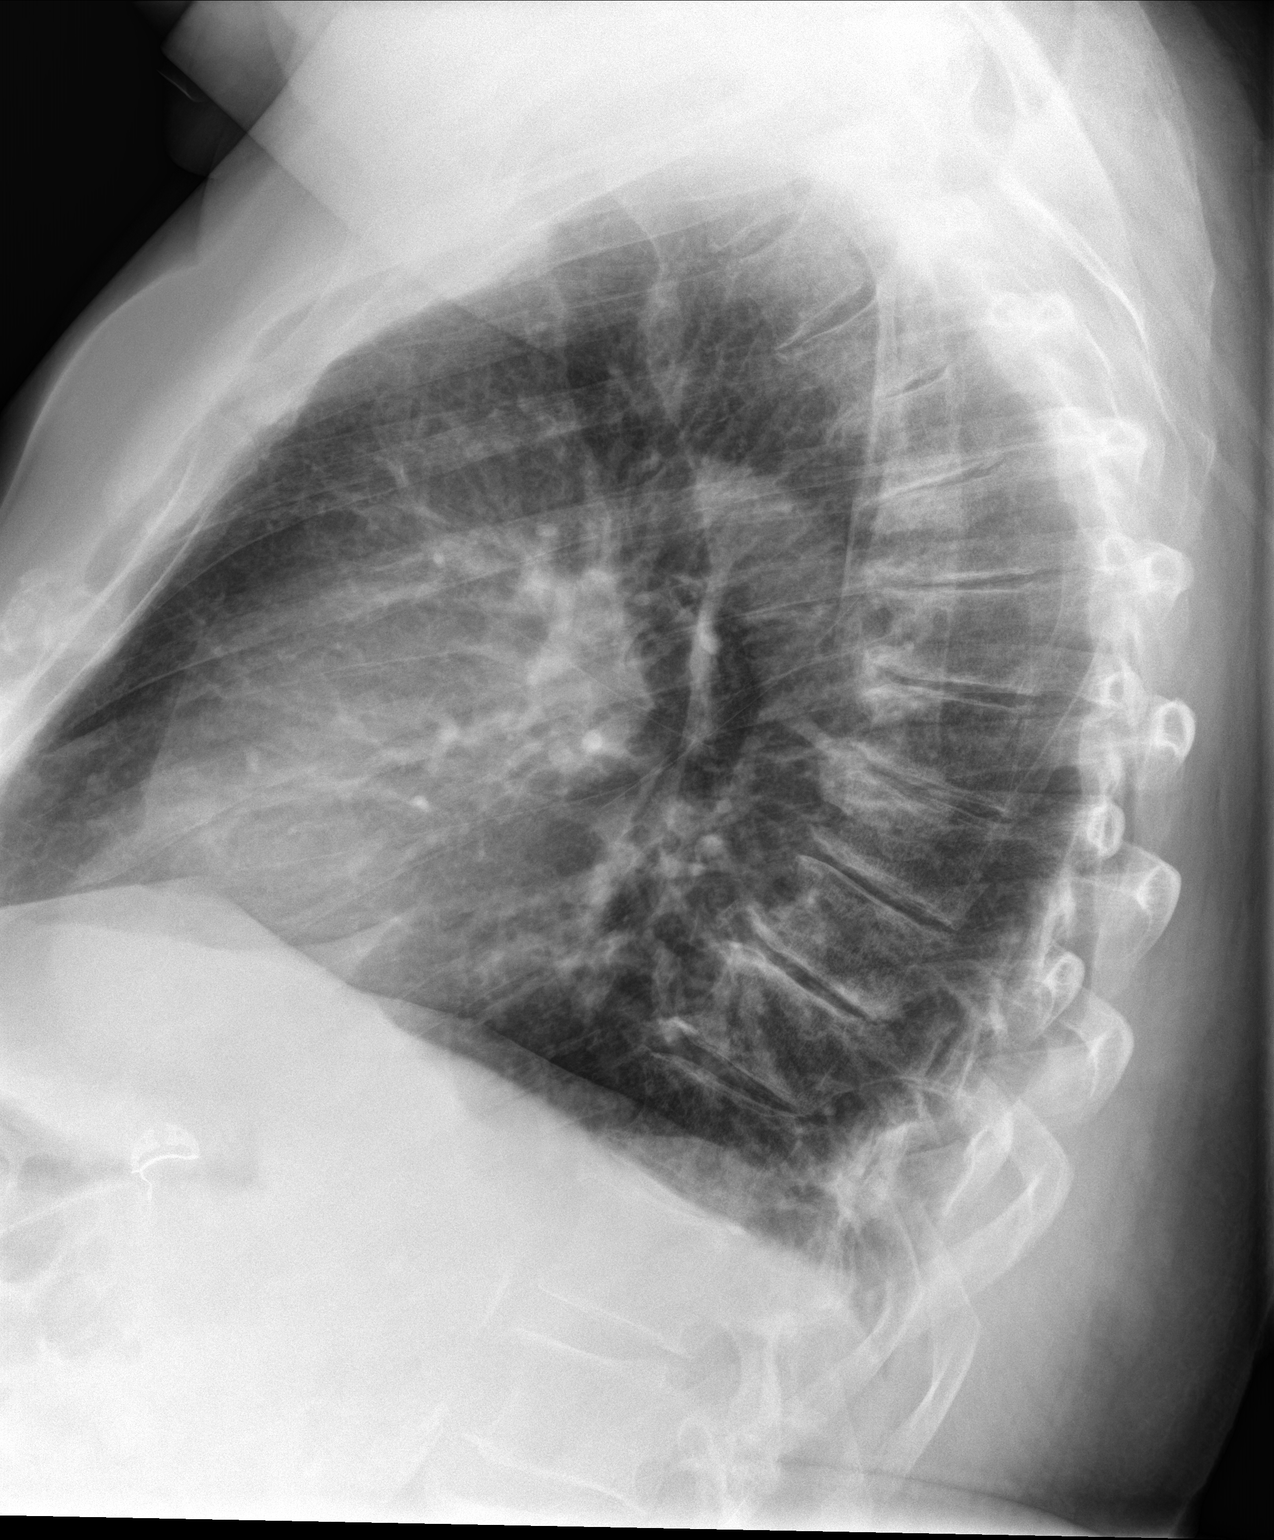

[chest pa]
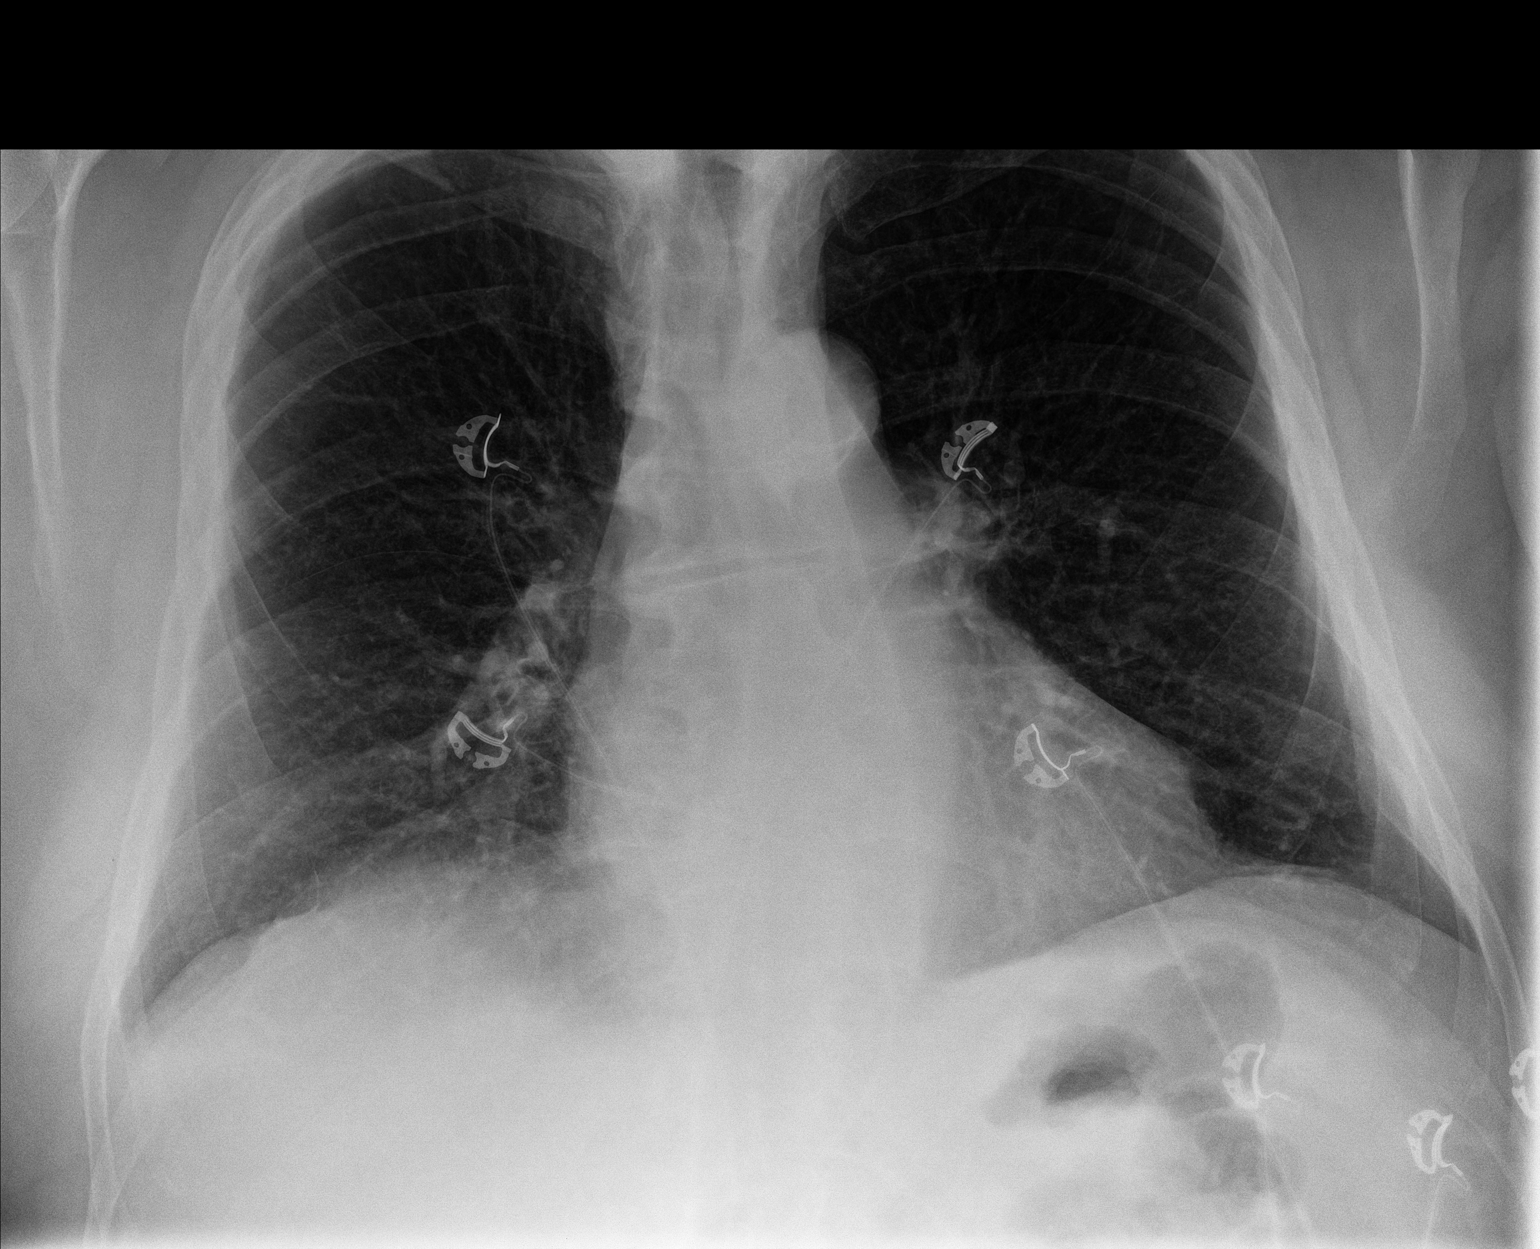

[2 of 2 positions shown; findings below may reference images not displayed]

FINDINGS: There is no edema or consolidation. Heart is upper normal in size
with pulmonary vascularity within normal limits. There is aortic
atherosclerosis. No evident adenopathy. There is degenerative change
in the thoracic spine.
IMPRESSION: Aortic atherosclerosis.  No edema or consolidation.

Aortic Atherosclerosis ([id number]-496.6).

## 2018-10-30 IMAGING — CT CT ABD-PELV W/ CM
2 of 5 series · 16 of 46 positions shown, 18 images · IV contrast (Isovue)
Comparison: None.

CLINICAL DATA: Dysuria, back pain.

EXAM:
CT ABDOMEN AND PELVIS WITH CONTRAST
TECHNIQUE: Multidetector CT imaging of the abdomen and pelvis was performed
using the standard protocol following bolus administration of
intravenous contrast.
CONTRAST:  100mL RGDOXQ-MYY IOPAMIDOL (RGDOXQ-MYY) INJECTION 61%

[Series 2: axial st · axial · 0.93mm/px · z∈[+842,+1287]mm · 13 of 103 slices shown, 15 images]
[im 7/103  soft-tissue]
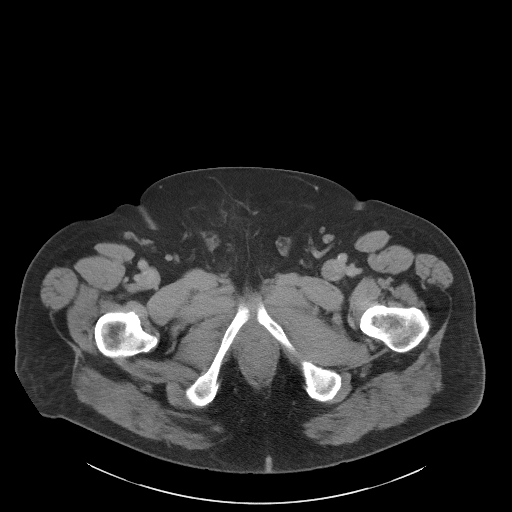
[im 7/103  bone]
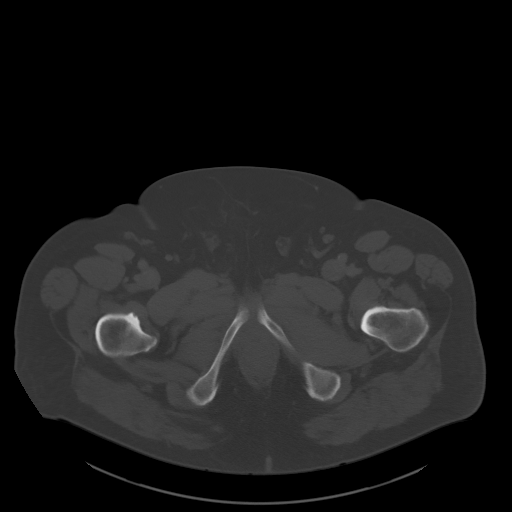
[im 13/103  soft-tissue]
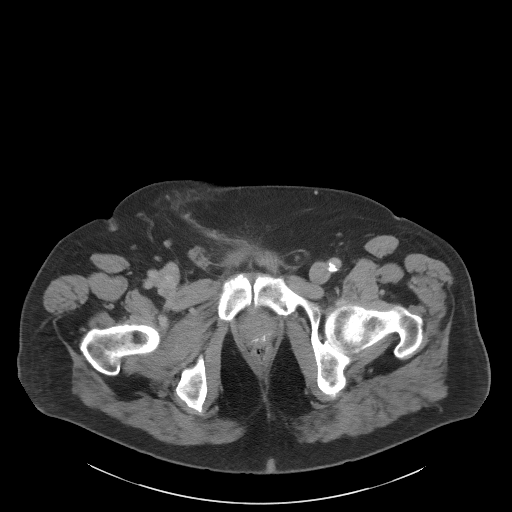
[im 20/103  soft-tissue]
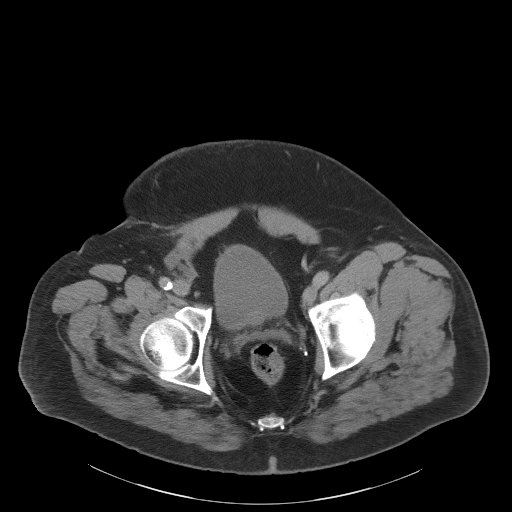
[im 32/103  soft-tissue]
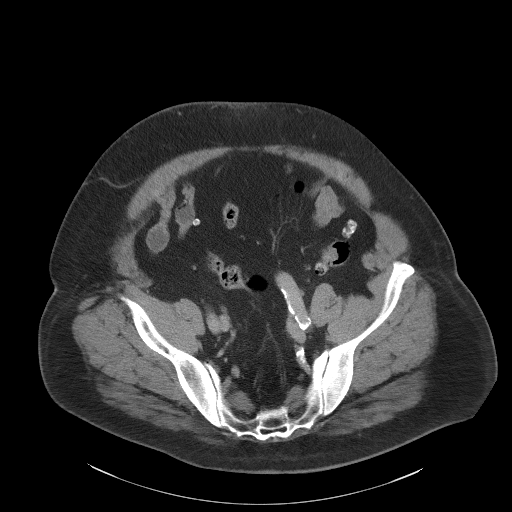
[im 39/103  soft-tissue]
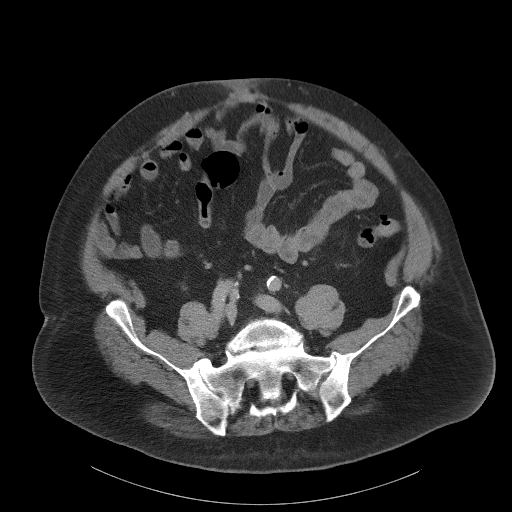
[im 45/103  soft-tissue]
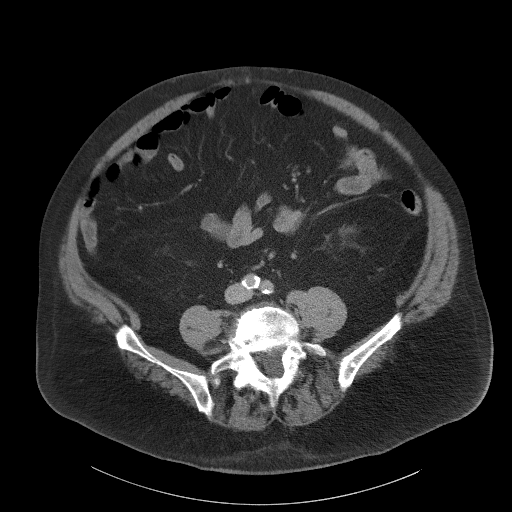
[im 52/103  soft-tissue]
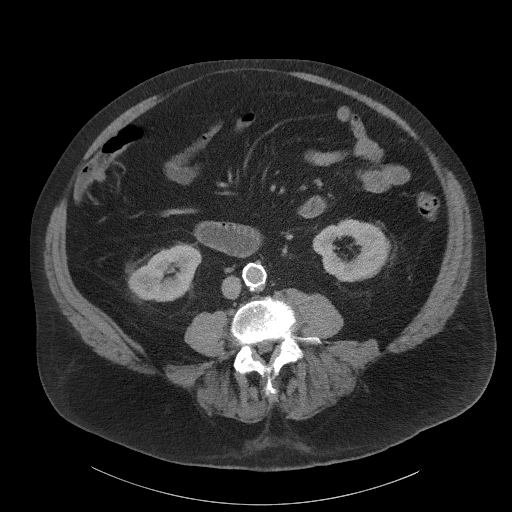
[im 58/103  soft-tissue]
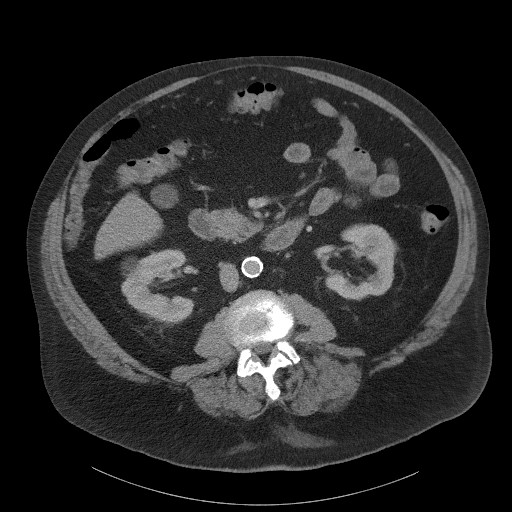
[im 64/103  soft-tissue]
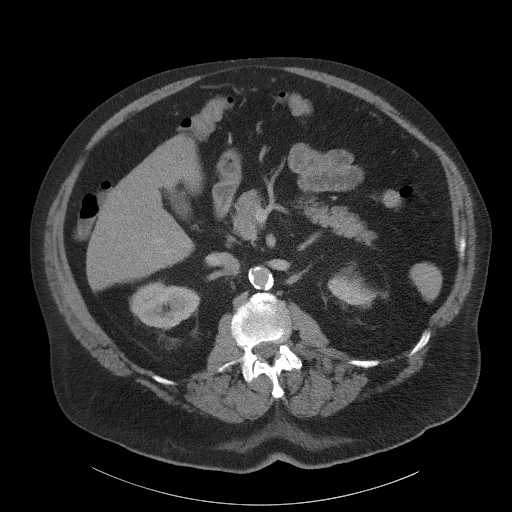
[im 64/103  bone]
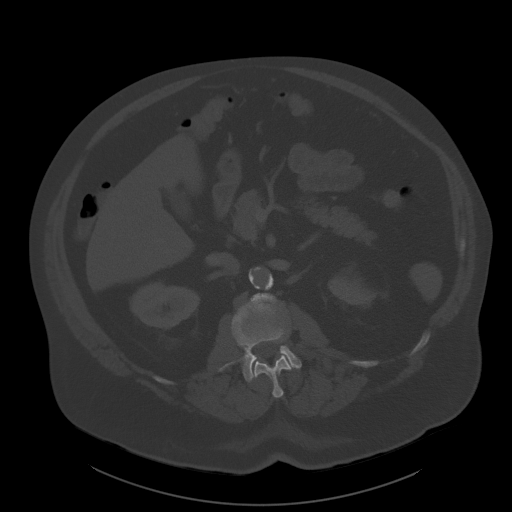
[im 71/103  soft-tissue]
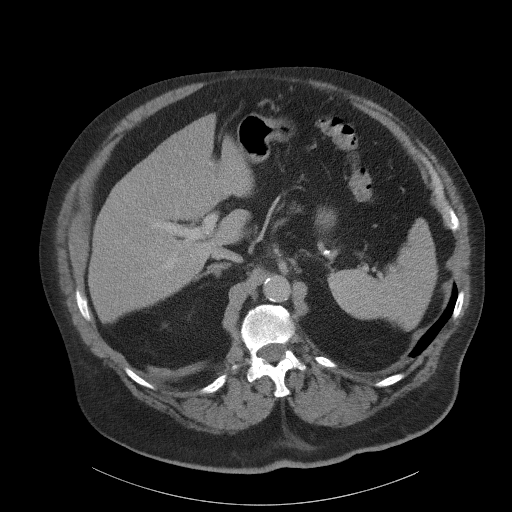
[im 83/103  soft-tissue]
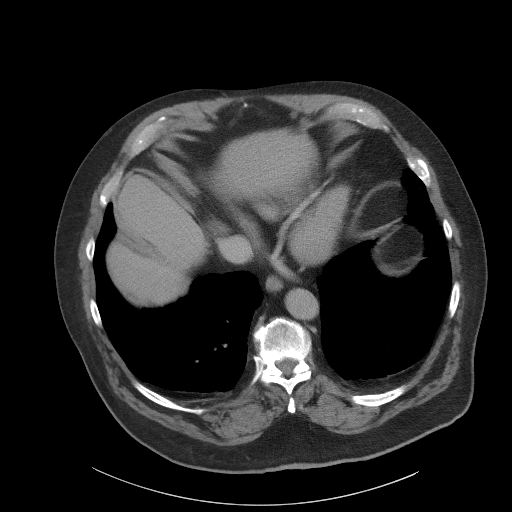
[im 90/103  soft-tissue]
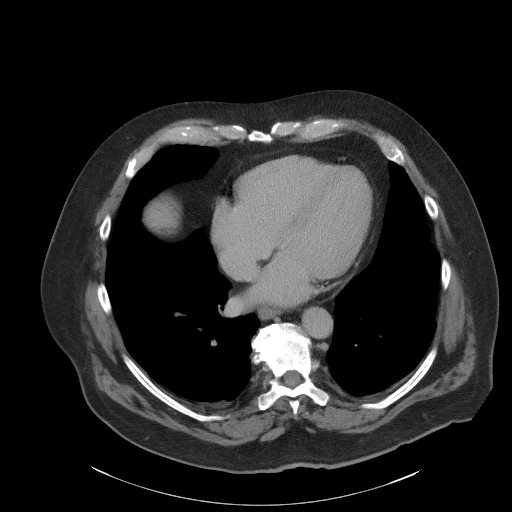
[im 96/103  soft-tissue]
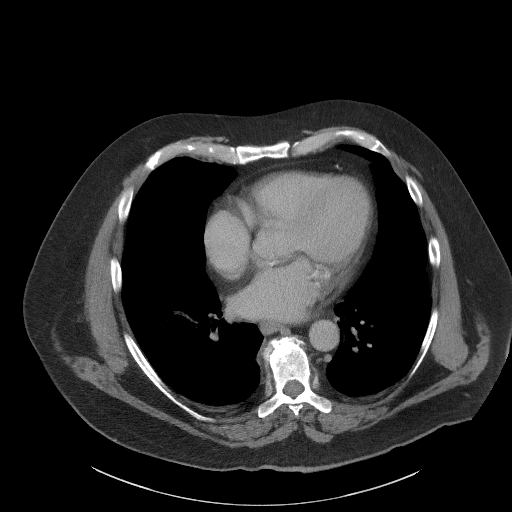

[Series 5: coronal st · coronal · 0.88mm/px · 3 of 128 slices shown]
[im 43/128  soft-tissue]
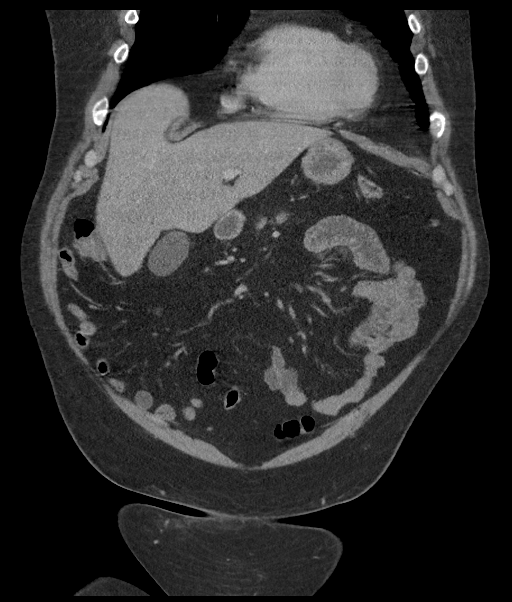
[im 57/128  soft-tissue]
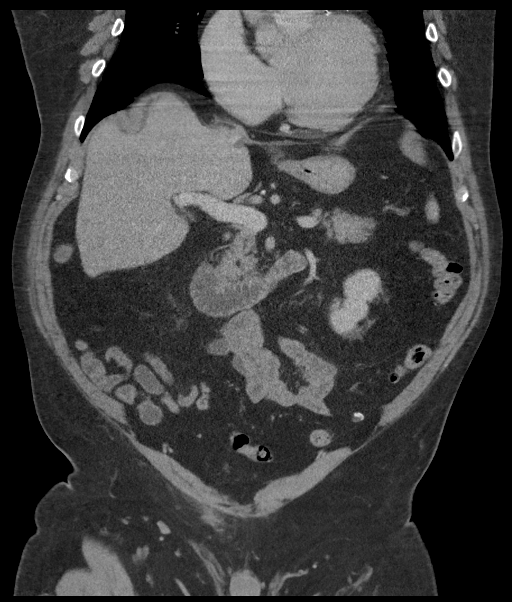
[im 71/128  soft-tissue]
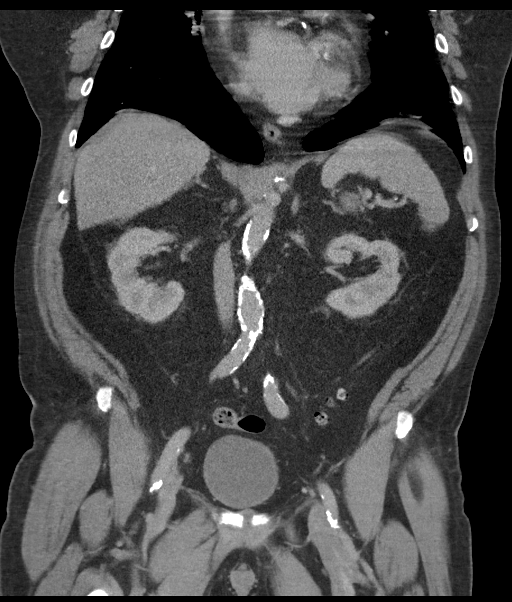

[16 of 46 positions shown; findings below may reference images not displayed]

FINDINGS: Lower chest: No acute abnormality.

Hepatobiliary: No focal liver abnormality is seen. No gallstones,
gallbladder wall thickening, or biliary dilatation.

Pancreas: Unremarkable. No pancreatic ductal dilatation or
surrounding inflammatory changes.

Spleen: Normal in size without focal abnormality.

Adrenals/Urinary Tract: Adrenal glands appear normal. Right renal
cysts are noted. No hydronephrosis or renal obstruction is noted. No
renal or ureteral calculi are noted. Urinary bladder is
unremarkable.

Stomach/Bowel: Stomach is within normal limits. Appendix appears
normal. No evidence of bowel wall thickening, distention, or
inflammatory changes.

Vascular/Lymphatic: No significant vascular findings are present. No
enlarged abdominal or pelvic lymph nodes.

Reproductive: Prostate is unremarkable.

Other: No abdominal wall hernia or abnormality. No abdominopelvic
ascites.

Musculoskeletal: No acute or significant osseous findings.
IMPRESSION: No acute abnormality seen in the abdomen or pelvis.

## 2018-11-18 DIAGNOSIS — Z0001 Encounter for general adult medical examination with abnormal findings: Secondary | ICD-10-CM | POA: Diagnosis not present

## 2018-11-18 DIAGNOSIS — R300 Dysuria: Secondary | ICD-10-CM | POA: Diagnosis not present

## 2018-11-18 DIAGNOSIS — E7849 Other hyperlipidemia: Secondary | ICD-10-CM | POA: Diagnosis not present

## 2018-11-18 DIAGNOSIS — N411 Chronic prostatitis: Secondary | ICD-10-CM | POA: Diagnosis not present

## 2018-11-18 DIAGNOSIS — Z1389 Encounter for screening for other disorder: Secondary | ICD-10-CM | POA: Diagnosis not present

## 2018-11-18 DIAGNOSIS — I10 Essential (primary) hypertension: Secondary | ICD-10-CM | POA: Diagnosis not present

## 2018-12-19 ENCOUNTER — Telehealth: Payer: Self-pay | Admitting: Physical Medicine and Rehabilitation

## 2018-12-19 NOTE — Telephone Encounter (Signed)
Is auth needed? Scheduled for 6/9.

## 2018-12-19 NOTE — Telephone Encounter (Signed)
ok 

## 2018-12-31 NOTE — Telephone Encounter (Signed)
Notification or Prior Authorization is not required for the requested services  This [employer] Advantage members plan does not currently require a prior authorization for [phone]  Decision ID #:[id number]

## 2019-01-07 ENCOUNTER — Ambulatory Visit: Payer: Self-pay

## 2019-01-07 ENCOUNTER — Other Ambulatory Visit: Payer: Self-pay

## 2019-01-07 ENCOUNTER — Encounter

## 2019-01-07 ENCOUNTER — Ambulatory Visit (INDEPENDENT_AMBULATORY_CARE_PROVIDER_SITE_OTHER): Payer: Medicare Other | Admitting: Physical Medicine and Rehabilitation

## 2019-01-07 VITALS — BP 134/70 | HR 67

## 2019-01-07 DIAGNOSIS — M961 Postlaminectomy syndrome, not elsewhere classified: Secondary | ICD-10-CM

## 2019-01-07 DIAGNOSIS — M5416 Radiculopathy, lumbar region: Secondary | ICD-10-CM

## 2019-01-07 DIAGNOSIS — M4316 Spondylolisthesis, lumbar region: Secondary | ICD-10-CM

## 2019-01-07 DIAGNOSIS — Q762 Congenital spondylolisthesis: Secondary | ICD-10-CM

## 2019-01-07 MED ORDER — BETAMETHASONE SOD PHOS & ACET 6 (3-3) MG/ML IJ SUSP
12.0000 mg | Freq: Once | INTRAMUSCULAR | Status: AC
  Administered 2019-01-07: 12 mg

## 2019-01-07 NOTE — Progress Notes (Signed)
 .  Numeric Pain Rating Scale and Functional Assessment Average Pain 7   In the last MONTH (on 0-10 scale) has pain interfered with the following?  1. General activity like being  able to carry out your everyday physical activities such as walking, climbing stairs, carrying groceries, or moving a chair?  Rating(4)   +Driver, -BT, -Dye Allergies.  

## 2019-01-09 ENCOUNTER — Ambulatory Visit (INDEPENDENT_AMBULATORY_CARE_PROVIDER_SITE_OTHER): Payer: Medicare Other | Admitting: Family Medicine

## 2019-01-09 ENCOUNTER — Encounter: Payer: Self-pay | Admitting: Family Medicine

## 2019-01-09 ENCOUNTER — Other Ambulatory Visit: Payer: Self-pay

## 2019-01-09 DIAGNOSIS — M25571 Pain in right ankle and joints of right foot: Secondary | ICD-10-CM

## 2019-01-09 NOTE — Progress Notes (Signed)
Office Visit Note   Patient: Jerry Boyer           Date of Birth: [birth year]           MRN: [id number] Visit Date: 01/09/2019 Requested by: Redmond School, [place],  [place] [address] PCP: Redmond School, MD  Subjective: Chief Complaint  Patient presents with  . Right Ankle - Pain    Pain returned x a couple months. No new injury.    HPI: He is here with recurrent right ankle pain.  Injection in November gave him a fair amount of relief lasting for several months.  His pain is back, he is tolerating it and using over-the-counter Absorbine Jr. with improvement.  At some point he would like to consider surgery but would prefer to wait until wintertime.  He is wondering whether another injection would help.              ROS: No fevers or chills.  All other systems were reviewed and are negative.  Objective: Vital Signs: There were no vitals taken for this visit.  Physical Exam:  General:  Alert and oriented, in no acute distress. Pulm:  Breathing unlabored. Psy:  Normal mood, congruent affect. Skin: No rash on the skin. Right ankle: Diffusely tender around the anterior lateral ankle joint.  Surprisingly good range of motion.  Imaging: None today.  Assessment & Plan: 1.  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  Follow-up as needed.     Procedures: Ultrasound-guided right ankle injection: After sterile prep with Betadine, injected 5 cc 1% lidocaine without epinephrine and 40 mg methylprednisolone into the anterior lateral ankle joint using ultrasound to guide needle placement into the joint recess.   PMFS History: Patient Active Problem List   Diagnosis Date Noted  . 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 Spondylolisthesis of lumbar region 06/03/2018  . Hemorrhage of colon following colonoscopy   . Blood in stool 03/25/2018  . Rectal bleeding   . Special screening for malignant neoplasms, colon   .  Benign neoplasm of ascending colon   . Benign neoplasm of descending colon   . Rectal polyp   . NSTEMI (non-ST elevated myocardial infarction) ([place]) 01/30/2018  . Sepsis ([place]) 01/29/2018  . Acute lower UTI 01/29/2018  . HLD (hyperlipidemia) 01/29/2018  . HTN (hypertension) 01/29/2018  . Elevated troponin 01/29/2018  . Non-recurrent unilateral inguinal hernia without obstruction or gangrene   . Umbilical hernia without obstruction and without gangrene    Past Medical History:  Diagnosis Date  . Anxiety   . Arthritis   . BPH (benign prostatic hyperplasia)   . Elevated troponin 01/30/2018   a. 01/2018: Type 2 NSTEMI most consistent with demand ischemia in the setting of Urosepsis.   . Hypercholesteremia   . Hypertension     Family History  Problem Relation Age of Onset  . Hypertension Mother   . Cancer Father   . Colon cancer Neg Hx     Past Surgical History:  Procedure Laterality Date  . BACK SURGERY     lumbar disc  . CATARACT EXTRACTION W/PHACO Right 02/05/2017   Procedure: CATARACT EXTRACTION PHACO AND INTRAOCULAR LENS PLACEMENT (IOC);  Surgeon: Tonny Branch, MD;  Location: [facility];  Service: Ophthalmology;  Laterality: Right;  CDE: 7.36  . CATARACT EXTRACTION W/PHACO Left 02/19/2017   Procedure: CATARACT EXTRACTION PHACO AND INTRAOCULAR LENS PLACEMENT LEFT EYE;  Surgeon: Tonny Branch, MD;  Location: [facility];  Service: Ophthalmology;  Laterality: Left;  CDE: 7.98  . COLONOSCOPY    . COLONOSCOPY N/A 03/19/2018   Procedure: COLONOSCOPY;  Surgeon: Aviva Signs, MD;  Location: [facility];  Service: Gastroenterology;  Laterality: N/A;  . COLONOSCOPY N/A 03/26/2018   Procedure: COLONOSCOPY;  Surgeon: Aviva Signs, MD;  Location: [facility];  Service: Gastroenterology;  Laterality: N/A;  . INGUINAL HERNIA REPAIR Right 12/14/2017   Procedure: HERNIA REPAIR INGUINAL ADULT WITH MESH;  Surgeon: Aviva Signs, MD;  Location: [facility];  Service: General;  Laterality: Right;  . JOINT  REPLACEMENT Left   . POLYPECTOMY  03/19/2018   Procedure: POLYPECTOMY;  Surgeon: Aviva Signs, MD;  Location: [facility];  Service: Gastroenterology;;  . UMBILICAL HERNIA REPAIR N/A 12/14/2017   Procedure: HERNIA REPAIR UMBILICAL ADULT WITH MESH;  Surgeon: Aviva Signs, MD;  Location: [facility];  Service: General;  Laterality: N/A;   Social History   Occupational History  . Not on file  Tobacco Use  . Smoking status: Former Smoker    Packs/day: 1.00    Years: 20.00    Pack years: 20.00    Types: Cigarettes    Quit date: 01/29/2001    Years since quitting: 17.9  . Smokeless tobacco: Never Used  Substance and Sexual Activity  . Alcohol use: Yes    Alcohol/week: 21.0 standard drinks    Types: 21 Cans of beer per week    Comment: states "drinks beer about every day"  . Drug use: No  . Sexual activity: Yes    Birth control/protection: None

## 2019-01-22 ENCOUNTER — Encounter: Payer: Self-pay | Admitting: Physical Medicine and Rehabilitation

## 2019-01-22 DIAGNOSIS — M961 Postlaminectomy syndrome, not elsewhere classified: Secondary | ICD-10-CM | POA: Insufficient documentation

## 2019-01-22 DIAGNOSIS — M5416 Radiculopathy, lumbar region: Secondary | ICD-10-CM | POA: Insufficient documentation

## 2019-01-22 NOTE — Procedures (Signed)
S1 Lumbosacral Transforaminal Epidural Steroid Injection - Sub-Pedicular Approach with Fluoroscopic Guidance   Patient: Jerry Boyer      Date of Birth: 02/02/1946 MRN: [id number] PCP: Redmond School, MD      Visit Date: 01/07/2019   Universal Protocol:    Date/Time: 06/24/206:16 AM  Consent Given By: the patient  Position:  PRONE  Additional Comments: Vital signs were monitored before and after the procedure. Patient was prepped and draped in the usual sterile fashion. The correct patient, procedure, and site was verified.   Injection Procedure Details:  Procedure Site One Meds Administered:  Meds ordered this encounter  Medications  . betamethasone acetate-betamethasone sodium phosphate (CELESTONE) injection 12 mg    Laterality: Left  Location/Site:  S1 Foramen   Needle size: 22 ga.  Needle type: Spinal  Needle Placement: Transforaminal  Findings:   -Comments: Excellent flow of contrast along the nerve and into the epidural space.  Procedure Details: After squaring off the sacral end-plate to get a true AP view, the C-arm was positioned so that the best possible view of the S1 foramen was visualized. The soft tissues overlying this structure were infiltrated with 2-3 ml. of 1% Lidocaine without Epinephrine.    The spinal needle was inserted toward the target using a "trajectory" view along the fluoroscope beam.  Under AP and lateral visualization, the needle was advanced so it did not puncture dura. Biplanar projections were used to confirm position. Aspiration was confirmed to be negative for CSF and/or blood. A 1-2 ml. volume of Isovue-250 was injected and flow of contrast was noted at each level. Radiographs were obtained for documentation purposes.   After attaining the desired flow of contrast documented above, a 0.5 to 1.0 ml test dose of 0.25% Marcaine was injected into each respective transforaminal space.  The patient was observed for 90 seconds post  injection.  After no sensory deficits were reported, and normal lower extremity motor function was noted,   the above injectate was administered so that equal amounts of the injectate were placed at each foramen (level) into the transforaminal epidural space.   Additional Comments:  The patient tolerated the procedure well Dressing: Band-Aid with 2 x 2 sterile gauze    Post-procedure details: Patient was observed during the procedure. Post-procedure instructions were reviewed.  Patient left the clinic in stable condition.

## 2019-01-22 NOTE — Progress Notes (Signed)
Jerry Boyer - 73 y.o. male MRN [id number]  Date of birth: 01-Apr-1946  Office Visit Note: Visit Date: 01/07/2019 PCP: Redmond School, MD Referred by: Redmond School, MD  Subjective: Chief Complaint  Patient presents with  . Lower Back - Follow-up  . Left Hip - Pain   HPI:  Jerry Boyer is a 73 y.o. male who comes in today For planned repeat left S1 transforaminal epidural steroid injection.  Last injection was in January and he did quite well up until just recently.  He continues to have ongoing chronic worsening at times repeatable left radicular leg pain from spondylolisthesis of L5 on S1 with lateral recess and foraminal narrowing.  He has had prior lumbar surgery.  He does have bilateral pars defects.  L5 injections and S1 injections of both helped and the S1 injection seems to be easier to perform with him.  He is been quite pleased with the results.  He has had no new injuries or any red flag complaints or weakness.  We will repeat the injection today.  ROS Otherwise per HPI.  Assessment & Plan: Visit Diagnoses:  1. Lumbar radiculopathy   2. Congenital spondylolysis   3. Spondylolisthesis of lumbar region   4. Post laminectomy syndrome     Plan: No additional findings.   Meds & Orders:  Meds ordered this encounter  Medications  . betamethasone acetate-betamethasone sodium phosphate (CELESTONE) injection 12 mg    Orders Placed This Encounter  Procedures  . XR C-ARM NO REPORT  . Epidural Steroid injection    Follow-up: No follow-ups on file.   Procedures: No procedures performed  S1 Lumbosacral Transforaminal Epidural Steroid Injection - Sub-Pedicular Approach with Fluoroscopic Guidance   Patient: Jerry Boyer      Date of Birth: [birth year] MRN: [id number] PCP: Redmond School, MD      Visit Date: 01/07/2019   Universal Protocol:    Date/Time: 06/24/206:16 AM  Consent Given By: the patient  Position:  PRONE  Additional Comments: Vital signs were  monitored before and after the procedure. Patient was prepped and draped in the usual sterile fashion. The correct patient, procedure, and site was verified.   Injection Procedure Details:  Procedure Site One Meds Administered:  Meds ordered this encounter  Medications  . betamethasone acetate-betamethasone sodium phosphate (CELESTONE) injection 12 mg    Laterality: Left  Location/Site:  S1 Foramen   Needle size: 22 ga.  Needle type: Spinal  Needle Placement: Transforaminal  Findings:   -Comments: Excellent flow of contrast along the nerve and into the epidural space.  Procedure Details: After squaring off the sacral end-plate to get a true AP view, the C-arm was positioned so that the best possible view of the S1 foramen was visualized. The soft tissues overlying this structure were infiltrated with 2-3 ml. of 1% Lidocaine without Epinephrine.    The spinal needle was inserted toward the target using a "trajectory" view along the fluoroscope beam.  Under AP and lateral visualization, the needle was advanced so it did not puncture dura. Biplanar projections were used to confirm position. Aspiration was confirmed to be negative for CSF and/or blood. A 1-2 ml. volume of Isovue-250 was injected and flow of contrast was noted at each level. Radiographs were obtained for documentation purposes.   After attaining the desired flow of contrast documented above, a 0.5 to 1.0 ml test dose of 0.25% Marcaine was injected into each respective transforaminal space.  The patient was observed for 90 seconds  post injection.  After no sensory deficits were reported, and normal lower extremity motor function was noted,   the above injectate was administered so that equal amounts of the injectate were placed at each foramen (level) into the transforaminal epidural space.   Additional Comments:  The patient tolerated the procedure well Dressing: Band-Aid with 2 x 2 sterile gauze     Post-procedure details: Patient was observed during the procedure. Post-procedure instructions were reviewed.  Patient left the clinic in stable condition.    Clinical History: EXAM: MRI LUMBAR SPINE WITHOUT CONTRAST  TECHNIQUE: Multiplanar, multisequence MR imaging of the lumbar spine was performed. No intravenous contrast was administered.  COMPARISON:  MRI lumbar spine 02/07/2012.  FINDINGS: Segmentation: 5 lumbar vertebral bodies are present.  Alignment: 5 mm anterolisthesis L5 on S1 related to pars defects. Minor scoliosis convex RIGHT upper lumbar region related to asymmetric loss of interspace height at L4-5 on the RIGHT.  Vertebrae: No worrisome osseous lesion.Moderate endplate reactive change of a degenerative nature at L2-3, L3-4, and L4-5.  Conus medullaris: Normal in size, signal, and location.  Paraspinal tissues: No evidence for hydronephrosis or paravertebral mass.  Disc levels:  L1-L2:  Mild bulge.  No impingement.  L2-L3: Central protrusion with BILATERAL foraminal extension. This is greater on the LEFT with LEFT-sided spurring. BILATERAL facet arthropathy. LEFT greater than RIGHT L2 and L3 nerve root impingement is possible.  L3-L4: Central protrusion. BILATERAL facet arthropathy. Mild foraminal and subarticular zone narrowing without clear-cut neural impingement, worse on the LEFT.  L4-L5: Disc space narrowing. Central disc osteophyte complex. Mild facet arthropathy. Postsurgical changes on the LEFT. No definite impingement.  L5-S1: 5 mm anterolisthesis. BILATERAL L5 spondylolysis. No definite subarticular zone or foraminal zone impingement. Compared with 2013, a similar appearance is noted.  IMPRESSION: Multilevel spondylosis as described.  Similar appearance to 2013.  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 Consider lateral standing flexion extension views to evaluate for dynamic instability.    Electronically Signed   By: Rolla Flatten M.D.   On: 11/18/2014 09:25     Objective:  VS:  HT:    WT:   BMI:     BP:134/70  HR:67bpm  TEMP: ( )  RESP:96 % Physical Exam  Ortho Exam Imaging: No results found.

## 2019-01-28 ENCOUNTER — Ambulatory Visit (INDEPENDENT_AMBULATORY_CARE_PROVIDER_SITE_OTHER): Payer: Medicare Other | Admitting: Urology

## 2019-01-28 DIAGNOSIS — N3000 Acute cystitis without hematuria: Secondary | ICD-10-CM | POA: Diagnosis not present

## 2019-04-29 DIAGNOSIS — Z23 Encounter for immunization: Secondary | ICD-10-CM | POA: Diagnosis not present

## 2019-05-16 ENCOUNTER — Telehealth: Payer: Self-pay | Admitting: Physical Medicine and Rehabilitation

## 2019-05-16 NOTE — Telephone Encounter (Signed)
ok 

## 2019-05-16 NOTE — Telephone Encounter (Signed)
Is PA needed for [phone]? Scheduled for 10/21.

## 2019-05-16 NOTE — Telephone Encounter (Signed)
Left message #1

## 2019-05-19 NOTE — Telephone Encounter (Signed)
64483 Injection(s), anesthetic agent and/or st more  Notification/Prior Authorization not required if procedure performed in Office; otherwise may be required for this service.

## 2019-05-21 ENCOUNTER — Ambulatory Visit: Payer: Self-pay

## 2019-05-21 ENCOUNTER — Other Ambulatory Visit: Payer: Self-pay

## 2019-05-21 ENCOUNTER — Ambulatory Visit (INDEPENDENT_AMBULATORY_CARE_PROVIDER_SITE_OTHER): Payer: Medicare Other | Admitting: Physical Medicine and Rehabilitation

## 2019-05-21 ENCOUNTER — Encounter: Payer: Self-pay | Admitting: Physical Medicine and Rehabilitation

## 2019-05-21 VITALS — BP 154/74 | HR 66

## 2019-05-21 DIAGNOSIS — M5416 Radiculopathy, lumbar region: Secondary | ICD-10-CM | POA: Diagnosis not present

## 2019-05-21 MED ORDER — BETAMETHASONE SOD PHOS & ACET 6 (3-3) MG/ML IJ SUSP
12.0000 mg | Freq: Once | INTRAMUSCULAR | Status: AC
  Administered 2019-05-21: 14:00:00 12 mg

## 2019-05-21 NOTE — Progress Notes (Signed)
 .  Numeric Pain Rating Scale and Functional Assessment Average Pain 8   In the last MONTH (on 0-10 scale) has pain interfered with the following?  1. General activity like being  able to carry out your everyday physical activities such as walking, climbing stairs, carrying groceries, or moving a chair?  Rating(8)   +Driver, -BT, -Dye Allergies.  

## 2019-05-28 ENCOUNTER — Ambulatory Visit: Payer: Medicare Other | Admitting: Family Medicine

## 2019-05-29 NOTE — Progress Notes (Signed)
Jerry Boyer - 73 y.o. male MRN [id number]  Date of birth: 10/22/1945  Office Visit Note: Visit Date: 05/21/2019 PCP: Jerry School, MD Referred by: Jerry School, MD  Subjective: No chief complaint on file.  HPI: Jerry Boyer is a 73 y.o. male who comes in today For planned left S1 transforaminal epidural steroid injection.  Patient has bilateral pars defects at L5-S1 with listhesis and off and on radicular leg pain and low back pain.  He is done extremely well in the past with epidural injection on a very infrequent basis.  There is always been talk with looking at facet joint blocks around the pars defect to see if that would be beneficial for his back pain but he has done really well with the epidural injections and I think most of his pain may be coming more from the nerves.  He actually has bilateral symptoms today but still with left more than right.  He has had no new injury or issues.  He does have issues where intermittently he will what I referred to as tweaked his back and he will have probably what is essentially a lumbar discal tear that flares up.  Nonetheless I think the right approach today is bilateral S1 transforaminal injections.  Last injection in June was pretty beneficial for him.  We are at a point though depending on where he is at where I think we need to update his MRI at some point.  Last MRI was 2016 although he has no red flag complaints.  He will continue with home exercises.  We will get him follow-up at times with Jerry Boyer who has been seeing for his ankle.  ROS Otherwise per HPI.  Assessment & Plan: Visit Diagnoses:  1. Lumbar radiculopathy     Plan: No additional findings.   Meds & Orders:  Meds ordered this encounter  Medications  . betamethasone acetate-betamethasone sodium phosphate (Jerry Boyer) injection 12 mg    Orders Placed This Encounter  Procedures  . XR C-ARM NO REPORT  . Epidural Steroid injection    Follow-up: Return if symptoms  worsen or fail to improve.   Procedures: No procedures performed  S1 Lumbosacral Transforaminal Epidural Steroid Injection - Sub-Pedicular Approach with Fluoroscopic Guidance   Patient: Jerry Boyer      Date of Birth: 1945-10-07 MRN: [id number] PCP: Jerry School, MD      Visit Date: 05/21/2019   Universal Protocol:    Date/Time: 10/29/206:12 AM  Consent Given By: the patient  Position:  PRONE  Additional Comments: Vital signs were monitored before and after the procedure. Patient was prepped and draped in the usual sterile fashion. The correct patient, procedure, and site was verified.   Injection Procedure Details:  Procedure Site One Meds Administered:  Meds ordered this encounter  Medications  . betamethasone acetate-betamethasone sodium phosphate (Jerry Boyer) injection 12 mg    Laterality: Bilateral  Location/Site:  S1 Foramen   Needle size: 22 ga.  Needle type: Spinal  Needle Placement: Transforaminal  Findings:   -Comments: Excellent flow of contrast along the nerve and into the epidural space.  Procedure Details: After squaring off the sacral end-plate to get a true AP view, the C-arm was positioned so that the best possible view of the S1 foramen was visualized. The soft tissues overlying this structure were infiltrated with 2-3 ml. of 1% Lidocaine without Epinephrine.    The spinal needle was inserted toward the target using a "trajectory" view along the fluoroscope beam.  Under AP and lateral visualization, the needle was advanced so it did not puncture dura. Biplanar projections were used to confirm position. Aspiration was confirmed to be negative for CSF and/or blood. A 1-2 ml. volume of Jerry Boyer was injected and flow of contrast was noted at each level. Radiographs were obtained for documentation purposes.   After attaining the desired flow of contrast documented above, a 0.5 to 1.0 ml test dose of 0.25% Jerry Boyer was injected into each  respective transforaminal space.  The patient was observed for 90 seconds post injection.  After no sensory deficits were reported, and normal lower extremity motor function was noted,   the above injectate was administered so that equal amounts of the injectate were placed at each foramen (level) into the transforaminal epidural space.   Additional Comments:  The patient tolerated the procedure well Dressing: Band-Aid with 2 x 2 sterile gauze    Post-procedure details: Patient was observed during the procedure. Post-procedure instructions were reviewed.  Patient left the clinic in stable condition.    Clinical History: EXAM: MRI LUMBAR SPINE WITHOUT CONTRAST  TECHNIQUE: Multiplanar, multisequence MR imaging of the lumbar spine was performed. No intravenous contrast was administered.  COMPARISON:  MRI lumbar spine 02/07/2012.  FINDINGS: Segmentation: 5 lumbar vertebral bodies are present.  Alignment: 5 mm anterolisthesis L5 on S1 related to pars defects. Minor scoliosis convex RIGHT upper lumbar region related to asymmetric loss of interspace height at L4-5 on the RIGHT.  Vertebrae: No worrisome osseous lesion.Moderate endplate reactive change of a degenerative nature at L2-3, L3-4, and L4-5.  Conus medullaris: Normal in size, signal, and location.  Paraspinal tissues: No evidence for hydronephrosis or paravertebral mass.  Disc levels:  L1-L2:  Mild bulge.  No impingement.  L2-L3: Central protrusion with BILATERAL foraminal extension. This is greater on the LEFT with LEFT-sided spurring. BILATERAL facet arthropathy. LEFT greater than RIGHT L2 and L3 nerve root impingement is possible.  L3-L4: Central protrusion. BILATERAL facet arthropathy. Mild foraminal and subarticular zone narrowing without clear-cut neural impingement, worse on the LEFT.  L4-L5: Disc space narrowing. Central disc osteophyte complex. Mild facet arthropathy. Postsurgical changes on  the LEFT. No definite impingement.  L5-S1: 5 mm anterolisthesis. BILATERAL L5 spondylolysis. No definite subarticular zone or foraminal zone impingement. Compared with 2013, a similar appearance is noted.  IMPRESSION: Multilevel spondylosis as described.  Similar appearance to 2013.  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 Consider lateral standing flexion extension views to evaluate for dynamic instability.   Electronically Signed   By: Rolla Flatten M.D.   On: 11/18/2014 09:25   He reports that he quit smoking about 18 years ago. His smoking use included cigarettes. He has a 20.00 pack-year smoking history. He has never used smokeless tobacco. No results for input(s): HGBA1C, LABURIC in the last 8760 hours.  Objective:  VS:  HT:    WT:   BMI:     BP:(!) 154/74  HR:66bpm  TEMP: ( )  RESP:  Physical Exam Constitutional:      General: He is not in acute distress.    Appearance: Normal appearance. He is not ill-appearing.  HENT:     Head: Normocephalic and atraumatic.     Right Ear: External ear normal.     Left Ear: External ear normal.  Eyes:     Extraocular Movements: Extraocular movements intact.  Cardiovascular:     Rate and Rhythm: Normal rate.     Pulses: Normal pulses.  Abdominal:  General: There is no distension.     Palpations: Abdomen is soft.  Musculoskeletal:        General: No tenderness or signs of injury.     Right lower leg: No edema.     Left lower leg: No edema.     Comments: Patient has good distal strength without clonus.  Skin:    Findings: No erythema or rash.  Neurological:     General: No focal deficit present.     Mental Status: He is alert and oriented to person, place, and time.     Sensory: No sensory deficit.     Motor: No weakness or abnormal muscle tone.     Coordination: Coordination normal.  Psychiatric:        Mood and Affect: Mood normal.        Behavior: Behavior normal.     Ortho Exam Imaging:  No results found.  Past Medical/Family/Surgical/Social History: Medications & Allergies reviewed per EMR, new medications updated. Patient Active Problem List   Diagnosis Date Noted  . Lumbar radiculopathy 01/22/2019  . Post laminectomy syndrome 01/22/2019  . 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 Spondylolisthesis of lumbar region 06/03/2018  . Hemorrhage of colon following colonoscopy   . Blood in stool 03/25/2018  . Rectal bleeding   . Special screening for malignant neoplasms, colon   . Benign neoplasm of ascending colon   . Benign neoplasm of descending colon   . Rectal polyp   . NSTEMI (non-ST elevated myocardial infarction) ([place]) 01/30/2018  . Sepsis ([place]) 01/29/2018  . Acute lower UTI 01/29/2018  . HLD (hyperlipidemia) 01/29/2018  . HTN (hypertension) 01/29/2018  . Elevated troponin 01/29/2018  . Non-recurrent unilateral inguinal hernia without obstruction or gangrene   . Umbilical hernia without obstruction and without gangrene    Past Medical History:  Diagnosis Date  . Anxiety   . Arthritis   . BPH (benign prostatic hyperplasia)   . Elevated troponin 01/30/2018   a. 01/2018: Type 2 NSTEMI most consistent with demand ischemia in the setting of Urosepsis.   . Hypercholesteremia   . Hypertension    Family History  Problem Relation Age of Onset  . Hypertension Mother   . Cancer Father   . Colon cancer Neg Hx    Past Surgical History:  Procedure Laterality Date  . BACK SURGERY     lumbar disc  . CATARACT EXTRACTION W/PHACO Right 02/05/2017   Procedure: CATARACT EXTRACTION PHACO AND INTRAOCULAR LENS PLACEMENT (I[facility]);  Surgeon: Tonny Branch, MD;  Location: [facility];  Service: Ophthalmology;  Laterality: Right;  CDE: 7.36  . CATARACT EXTRACTION W/PHACO Left 02/19/2017   Procedure: CATARACT EXTRACTION PHACO AND INTRAOCULAR LENS PLACEMENT LEFT EYE;  Surgeon: Tonny Branch, MD;  Location: [facility];  Service: Ophthalmology;  Laterality: Left;  CDE: 7.98  . COLONOSCOPY    .  COLONOSCOPY N/A 03/19/2018   Procedure: COLONOSCOPY;  Surgeon: Aviva Signs, MD;  Location: [facility];  Service: Gastroenterology;  Laterality: N/A;  . COLONOSCOPY N/A 03/26/2018   Procedure: COLONOSCOPY;  Surgeon: Aviva Signs, MD;  Location: [facility];  Service: Gastroenterology;  Laterality: N/A;  . INGUINAL HERNIA REPAIR Right 12/14/2017   Procedure: HERNIA REPAIR INGUINAL ADULT WITH MESH;  Surgeon: Aviva Signs, MD;  Location: [facility];  Service: General;  Laterality: Right;  . JOINT REPLACEMENT Left   . POLYPECTOMY  03/19/2018   Procedure: POLYPECTOMY;  Surgeon: Aviva Signs, MD;  Location: [facility];  Service: Gastroenterology;;  . UMBILICAL HERNIA REPAIR  N/A 12/14/2017   Procedure: HERNIA REPAIR UMBILICAL ADULT WITH MESH;  Surgeon: Aviva Signs, MD;  Location: [facility];  Service: General;  Laterality: N/A;   Social History   Occupational History  . Not on file  Tobacco Use  . Smoking status: Former Smoker    Packs/day: 1.00    Years: 20.00    Pack years: 20.00    Types: Cigarettes    Quit date: 01/29/2001    Years since quitting: 18.3  . Smokeless tobacco: Never Used  Substance and Sexual Activity  . Alcohol use: Yes    Alcohol/week: 21.0 standard drinks    Types: 21 Cans of beer per week    Comment: states "drinks beer about every day"  . Drug use: No  . Sexual activity: Yes    Birth control/protection: None

## 2019-05-29 NOTE — Procedures (Signed)
S1 Lumbosacral Transforaminal Epidural Steroid Injection - Sub-Pedicular Approach with Fluoroscopic Guidance   Patient: Jerry Boyer      Date of Birth: 1946/05/11 MRN: [id number] PCP: Redmond School, MD      Visit Date: 05/21/2019   Universal Protocol:    Date/Time: 10/29/206:12 AM  Consent Given By: the patient  Position:  PRONE  Additional Comments: Vital signs were monitored before and after the procedure. Patient was prepped and draped in the usual sterile fashion. The correct patient, procedure, and site was verified.   Injection Procedure Details:  Procedure Site One Meds Administered:  Meds ordered this encounter  Medications  . betamethasone acetate-betamethasone sodium phosphate (CELESTONE) injection 12 mg    Laterality: Bilateral  Location/Site:  S1 Foramen   Needle size: 22 ga.  Needle type: Spinal  Needle Placement: Transforaminal  Findings:   -Comments: Excellent flow of contrast along the nerve and into the epidural space.  Procedure Details: After squaring off the sacral end-plate to get a true AP view, the C-arm was positioned so that the best possible view of the S1 foramen was visualized. The soft tissues overlying this structure were infiltrated with 2-3 ml. of 1% Lidocaine without Epinephrine.    The spinal needle was inserted toward the target using a "trajectory" view along the fluoroscope beam.  Under AP and lateral visualization, the needle was advanced so it did not puncture dura. Biplanar projections were used to confirm position. Aspiration was confirmed to be negative for CSF and/or blood. A 1-2 ml. volume of Isovue-250 was injected and flow of contrast was noted at each level. Radiographs were obtained for documentation purposes.   After attaining the desired flow of contrast documented above, a 0.5 to 1.0 ml test dose of 0.25% Marcaine was injected into each respective transforaminal space.  The patient was observed for 90 seconds post  injection.  After no sensory deficits were reported, and normal lower extremity motor function was noted,   the above injectate was administered so that equal amounts of the injectate were placed at each foramen (level) into the transforaminal epidural space.   Additional Comments:  The patient tolerated the procedure well Dressing: Band-Aid with 2 x 2 sterile gauze    Post-procedure details: Patient was observed during the procedure. Post-procedure instructions were reviewed.  Patient left the clinic in stable condition.

## 2019-06-02 ENCOUNTER — Ambulatory Visit (INDEPENDENT_AMBULATORY_CARE_PROVIDER_SITE_OTHER): Payer: Medicare Other | Admitting: Family Medicine

## 2019-06-02 ENCOUNTER — Ambulatory Visit: Payer: Self-pay

## 2019-06-02 ENCOUNTER — Encounter: Payer: Self-pay | Admitting: Family Medicine

## 2019-06-02 ENCOUNTER — Other Ambulatory Visit: Payer: Self-pay

## 2019-06-02 ENCOUNTER — Telehealth: Payer: Self-pay | Admitting: Orthopedic Surgery

## 2019-06-02 DIAGNOSIS — M25571 Pain in right ankle and joints of right foot: Secondary | ICD-10-CM | POA: Diagnosis not present

## 2019-06-02 NOTE — Telephone Encounter (Signed)
Called patient left message to return call to schedule an appointment with Dr Sharol Given per dr Junius Roads for surgical consult. [phone]

## 2019-06-02 NOTE — Progress Notes (Signed)
Can you please call pt to make appt with Dr. Sharol Given for surgical consult per Dr. Junius Roads. Thank you so much!

## 2019-06-02 NOTE — Progress Notes (Signed)
Office Visit Note   Patient: Jerry Boyer           Date of Birth: 1945/12/19           MRN: [id number] Visit Date: 06/02/2019 Requested by: Redmond School, [place],  [place] [id number] PCP: Redmond School, MD  Subjective: Chief Complaint  Patient presents with  . Right Ankle - Pain    Pain came back in August (s/p cortisone injection in June). Pain is the same as before.    HPI: He is here with recurrent right ankle pain.  History of end-stage DJD.  Injection in June helped until a couple months ago, not complete relief but moderate improvement.  He would like to consider surgery but not until January or February.              ROS: No fevers or chills.  All other systems were reviewed and are negative.  Objective: Vital Signs: There were no vitals taken for this visit.  Physical Exam:  General:  Alert and oriented, in no acute distress. Pulm:  Breathing unlabored. Psy:  Normal mood, congruent affect. Skin: No erythema or rash. Right ankle: Small effusion, tender over the lateral joint line.  Imaging: None other than for needle guidance  Assessment & Plan: 1.  End-stage right ankle DJD -Discussed with patient, elected to inject again today.  We will refer him to Dr. Sharol Given to discuss possible ankle fusion.     Procedures: Right ankle ultrasound-guided injection: After sterile prep with Betadine, injected 5 cc 1% lidocaine without epinephrine and 40 mg methylprednisolone into the lateral ankle joint space using ultrasound to guide needle placement.    PMFS History: Patient Active Problem List   Diagnosis Date Noted  . Lumbar radiculopathy 01/22/2019  . Post laminectomy syndrome 01/22/2019  . 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 Spondylolisthesis of lumbar region 06/03/2018  . Hemorrhage of colon following colonoscopy   . Blood in stool 03/25/2018  . Rectal bleeding   . Special screening for malignant neoplasms, colon   . Benign neoplasm  of ascending colon   . Benign neoplasm of descending colon   . Rectal polyp   . NSTEMI (non-ST elevated myocardial infarction) ([facility]) 01/30/2018  . Sepsis ([facility]) 01/29/2018  . Acute lower UTI 01/29/2018  . HLD (hyperlipidemia) 01/29/2018  . HTN (hypertension) 01/29/2018  . Elevated troponin 01/29/2018  . Non-recurrent unilateral inguinal hernia without obstruction or gangrene   . Umbilical hernia without obstruction and without gangrene    Past Medical History:  Diagnosis Date  . Anxiety   . Arthritis   . BPH (benign prostatic hyperplasia)   . Elevated troponin 01/30/2018   a. 01/2018: Type 2 NSTEMI most consistent with demand ischemia in the setting of Urosepsis.   . Hypercholesteremia   . Hypertension     Family History  Problem Relation Age of Onset  . Hypertension Mother   . Cancer Father   . Colon cancer Neg Hx     Past Surgical History:  Procedure Laterality Date  . BACK SURGERY     lumbar disc  . CATARACT EXTRACTION W/PHACO Right 02/05/2017   Procedure: CATARACT EXTRACTION PHACO AND INTRAOCULAR LENS PLACEMENT (IOC);  Surgeon: Tonny Branch, MD;  Location: [facility];  Service: Ophthalmology;  Laterality: Right;  CDE: 7.36  . CATARACT EXTRACTION W/PHACO Left 02/19/2017   Procedure: CATARACT EXTRACTION PHACO AND INTRAOCULAR LENS PLACEMENT LEFT EYE;  Surgeon: Tonny Branch, MD;  Location: [facility];  Service: Ophthalmology;  Laterality: Left;  CDE: 7.98  . COLONOSCOPY    . COLONOSCOPY N/A 03/19/2018   Procedure: COLONOSCOPY;  Surgeon: Aviva Signs, MD;  [facility];  Service: Gastroenterology;  Laterality: N/A;  . COLONOSCOPY N/A 03/26/2018   Procedure: COLONOSCOPY;  Surgeon: Aviva Signs, MD;  [facility];  Service: Gastroenterology;  Laterality: N/A;  . INGUINAL HERNIA REPAIR Right 12/14/2017   Procedure: HERNIA REPAIR INGUINAL ADULT WITH MESH;  Surgeon: Aviva Signs, MD;  Location: [facility];  Service: General;  Laterality: Right;  . JOINT REPLACEMENT Left   .  POLYPECTOMY  03/19/2018   Procedure: POLYPECTOMY;  Surgeon: Aviva Signs, MD;  [facility];  Service: Gastroenterology;;  . UMBILICAL HERNIA REPAIR N/A 12/14/2017   Procedure: HERNIA REPAIR UMBILICAL ADULT WITH MESH;  Surgeon: Aviva Signs, MD;  Location: [facility];  Service: General;  Laterality: N/A;   Social History   Occupational History  . Not on file  Tobacco Use  . Smoking status: Former Smoker    Packs/day: 1.00    Years: 20.00    Pack years: 20.00    Types: Cigarettes    Quit date: 01/29/2001    Years since quitting: 18.3  . Smokeless tobacco: Never Used  Substance and Sexual Activity  . Alcohol use: Yes    Alcohol/week: 21.0 standard drinks    Types: 21 Cans of beer per week    Comment: states "drinks beer about every day"  . Drug use: No  . Sexual activity: Yes    Birth control/protection: None

## 2019-06-10 ENCOUNTER — Encounter: Payer: Self-pay | Admitting: Orthopedic Surgery

## 2019-06-10 ENCOUNTER — Other Ambulatory Visit: Payer: Self-pay

## 2019-06-10 ENCOUNTER — Ambulatory Visit: Payer: Medicare Other | Admitting: Orthopedic Surgery

## 2019-06-10 VITALS — Ht 73.0 in | Wt 260.0 lb

## 2019-06-10 DIAGNOSIS — M12571 Traumatic arthropathy, right ankle and foot: Secondary | ICD-10-CM

## 2019-06-10 NOTE — Progress Notes (Signed)
Office Visit Note   Patient: Jerry Boyer           Date of Birth: 12/04/1945           MRN: [id number] Visit Date: 06/10/2019              Requested by: Redmond School, MD [address] [place],  [place] [address] PCP: Redmond School, MD  Chief Complaint  Patient presents with  . Right Ankle - Follow-up, Pain    Discuss Surgery      HPI: Patient is a 73 year old gentleman with traumatic arthritis right ank patient feels the le.  Arthritis came from multiple injuries while playing football.  Patient is undergone good conservative therapy with ankle injections which have provided minimal relief.  Patient complains of pain with activities of daily living.  Patient has a 20-pack-year history of tobacco use he is currently not smoking.  Negative history for sleep apnea does not use a CPAP machine negative history of diabetes.  Assessment & Plan: Visit Diagnoses:  1. Traumatic arthritis of right ankle     Plan: Discussed with patient's deformity and traumatic collapse his best option is to proceed with an ankle fusion open.  Would plan for an anterior plate.  Risk and benefits were discussed including infection neurovascular injury nonhealing of the skin nonhealing of the bone need for additional surgery.  Patient states he understands wishes to proceed at this time.  Patient wished to proceed with surgery in February.  Follow-Up Instructions: Return for Patient will follow-up 1 week after surgery.Manson Passey Exam  Patient is alert, oriented, no adenopathy, well-dressed, normal affect, normal respiratory effort. Examination patient has a palpable dorsalis pedis and posterior tibial pulse.  He has significant Achilles contracture with dorsiflexion 20 degrees short of neutral with his knee extended.  Review of the radiographs shows end-stage traumatic arthritis of the right ankle with the talus and varus.  Imaging: No results found. No images are attached to the encounter.   Labs: Lab Results  Component Value Date   REPTSTATUS 10/11/2018 FINAL 10/08/2018   CULT >=100,000 COLONIES/mL KLEBSIELLA OXYTOCA (A) 10/08/2018   LABORGA KLEBSIELLA OXYTOCA (A) 10/08/2018     Lab Results  Component Value Date   ALBUMIN 3.7 03/25/2018   ALBUMIN 3.7 02/06/2018   ALBUMIN 3.2 (L) 01/30/2018    Lab Results  Component Value Date   MG 2.0 01/30/2018   No results found for: VD25OH  No results found for: PREALBUMIN CBC EXTENDED Latest Ref Rng & Units 03/27/2018 03/26/2018 03/25/2018  WBC 4.0 - 10.5 K/uL 5.8 - 5.7  RBC 4.22 - 5.81 MIL/uL 3.26(L) - 3.27(L)  HGB 13.0 - 17.0 g/dL 10.3(L) 10.7(L) 10.2(L)  HCT 39.0 - 52.0 % 31.1(L) 31.9(L) 31.6(L)  PLT 150 - 400 K/uL 163 - 166  NEUTROABS 1.7 - 7.7 K/uL - - -  LYMPHSABS 0.7 - 4.0 K/uL - - -     Body mass index is 34.3 kg/m.  Orders:  No orders of the defined types were placed in this encounter.  No orders of the defined types were placed in this encounter.    Procedures: No procedures performed  Clinical Data: No additional findings.  ROS:  All other systems negative, except as noted in the HPI. Review of Systems  Objective: Vital Signs: Ht 6\' 1"  (1.854 m)   Wt 260 lb (117.9 kg)   BMI 34.30 kg/m   Specialty Comments:  No specialty comments available.  PMFS History: Patient Active  Problem List   Diagnosis Date Noted  . Lumbar radiculopathy 01/22/2019  . Post laminectomy syndrome 01/22/2019  . 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 Spondylolisthesis of lumbar region 06/03/2018  . Hemorrhage of colon following colonoscopy   . Blood in stool 03/25/2018  . Rectal bleeding   . Special screening for malignant neoplasms, colon   . Benign neoplasm of ascending colon   . Benign neoplasm of descending colon   . Rectal polyp   . NSTEMI (non-ST elevated myocardial infarction) ([facility]) 01/30/2018  . Sepsis ([place] [place]) 01/29/2018  . Acute lower UTI 01/29/2018  . HLD (hyperlipidemia) 01/29/2018  . HTN  (hypertension) 01/29/2018  . Elevated troponin 01/29/2018  . Non-recurrent unilateral inguinal hernia without obstruction or gangrene   . Umbilical hernia without obstruction and without gangrene    Past Medical History:  Diagnosis Date  . Anxiety   . Arthritis   . BPH (benign prostatic hyperplasia)   . Elevated troponin 01/30/2018   a. 01/2018: Type 2 NSTEMI most consistent with demand ischemia in the setting of Urosepsis.   . Hypercholesteremia   . Hypertension     Family History  Problem Relation Age of Onset  . Hypertension Mother   . Cancer Father   . Colon cancer Neg Hx     Past Surgical History:  Procedure Laterality Date  . BACK SURGERY     lumbar disc  . CATARACT EXTRACTION W/PHACO Right 02/05/2017   Procedure: CATARACT EXTRACTION PHACO AND INTRAOCULAR LENS PLACEMENT (IOC);  Surgeon: Tonny Branch, MD;  Location: [facility];  Service: Ophthalmology;  Laterality: Right;  CDE: 7.36  . CATARACT EXTRACTION W/PHACO Left 02/19/2017   Procedure: CATARACT EXTRACTION PHACO AND INTRAOCULAR LENS PLACEMENT LEFT EYE;  Surgeon: Tonny Branch, MD;  Location: [facility];  Service: Ophthalmology;  Laterality: Left;  CDE: 7.98  . COLONOSCOPY    . COLONOSCOPY N/A 03/19/2018   Procedure: COLONOSCOPY;  Surgeon: Aviva Signs, MD;  [facility];  Service: Gastroenterology;  Laterality: N/A;  . COLONOSCOPY N/A 03/26/2018   Procedure: COLONOSCOPY;  Surgeon: Aviva Signs, MD;  [facility];  Service: Gastroenterology;  Laterality: N/A;  . INGUINAL HERNIA REPAIR Right 12/14/2017   Procedure: HERNIA REPAIR INGUINAL ADULT WITH MESH;  Surgeon: Aviva Signs, MD;  Location: [facility];  Service: General;  Laterality: Right;  . JOINT REPLACEMENT Left   . POLYPECTOMY  03/19/2018   Procedure: POLYPECTOMY;  Surgeon: Aviva Signs, MD;  [facility];  Service: Gastroenterology;;  . UMBILICAL HERNIA REPAIR N/A 12/14/2017   Procedure: HERNIA REPAIR UMBILICAL ADULT WITH MESH;  Surgeon: Aviva Signs, MD;  Location: [facility];  Service: General;  Laterality: N/A;   Social History   Occupational History  . Not on file  Tobacco Use  . Smoking status: Former Smoker    Packs/day: 1.00    Years: 20.00    Pack years: 20.00    Types: Cigarettes    Quit date: 01/29/2001    Years since quitting: 18.3  . Smokeless tobacco: Never Used  Substance and Sexual Activity  . Alcohol use: Yes    Alcohol/week: 21.0 standard drinks    Types: 21 Cans of beer per week    Comment: states "drinks beer about every day"  . Drug use: No  . Sexual activity: Yes    Birth control/protection: None

## 2019-07-26 DIAGNOSIS — H35342 Macular cyst, hole, or pseudohole, left eye: Secondary | ICD-10-CM | POA: Diagnosis not present

## 2019-08-21 DIAGNOSIS — H43813 Vitreous degeneration, bilateral: Secondary | ICD-10-CM | POA: Diagnosis not present

## 2019-08-21 DIAGNOSIS — H35342 Macular cyst, hole, or pseudohole, left eye: Secondary | ICD-10-CM | POA: Diagnosis not present

## 2019-08-21 DIAGNOSIS — H26492 Other secondary cataract, left eye: Secondary | ICD-10-CM | POA: Diagnosis not present

## 2019-08-22 ENCOUNTER — Telehealth: Payer: Self-pay | Admitting: Cardiovascular Disease

## 2019-08-22 NOTE — Telephone Encounter (Signed)

## 2019-08-25 ENCOUNTER — Encounter: Payer: Self-pay | Admitting: Cardiovascular Disease

## 2019-08-25 ENCOUNTER — Telehealth (INDEPENDENT_AMBULATORY_CARE_PROVIDER_SITE_OTHER): Payer: Medicare HMO | Admitting: Cardiovascular Disease

## 2019-08-25 VITALS — BP 143/79 | HR 68 | Ht 74.0 in | Wt 248.0 lb

## 2019-08-25 DIAGNOSIS — E785 Hyperlipidemia, unspecified: Secondary | ICD-10-CM

## 2019-08-25 DIAGNOSIS — R778 Other specified abnormalities of plasma proteins: Secondary | ICD-10-CM | POA: Diagnosis not present

## 2019-08-25 DIAGNOSIS — I10 Essential (primary) hypertension: Secondary | ICD-10-CM

## 2019-08-25 NOTE — Patient Instructions (Signed)
Medication Instructions:  Your physician recommends that you continue on your current medications as directed. Please refer to the Current Medication list given to you today.  *If you need a refill on your cardiac medications before your next appointment, please call your pharmacy*  Lab Work: NONE If you have labs (blood work) drawn today and your tests are completely normal, you will receive your results only by: Marland Kitchen MyChart Message (if you have MyChart) OR . A paper copy in the mail If you have any lab test that is abnormal or we need to change your treatment, we will call you to review the results.  Testing/Procedures: NONE  Follow-Up: At [facility], you and your health needs are our priority.  As part of our continuing mission to provide you with exceptional heart care, we have created designated Provider Care Teams.  These Care Teams include your primary Cardiologist (physician) and Advanced Practice Providers (APPs -  Physician Assistants and Nurse Practitioners) who all work together to provide you with the care you need, when you need it.    Follow up as needed with Dr.Koneswaran    Thank you for choosing [facility] !

## 2019-08-25 NOTE — Progress Notes (Signed)
Virtual Visit via Telephone Note   This visit type was conducted due to national recommendations for restrictions regarding the COVID-19 Pandemic (e.g. social distancing) in an effort to limit this patient's exposure and mitigate transmission in our community.  Due to his co-morbid illnesses, this patient is at least at moderate risk for complications without adequate follow up.  This format is felt to be most appropriate for this patient at this time.  The patient did not have access to video technology/had technical difficulties with video requiring transitioning to audio format only (telephone).  All issues noted in this document were discussed and addressed.  No physical exam could be performed with this format.  Please refer to the patient's chart for his  consent to telehealth for [facility].   Date:  08/25/2019   ID:  [name], [name] [birth year], MRN [id number]  Patient Location: Home Provider Location: Home  PCP:  Redmond School, MD  Cardiologist:  Kate Sable, MD  Electrophysiologist:  None   Evaluation Performed:  Follow-Up Visit  Chief Complaint: Elevated troponin  History of Present Illness:    Jerry Boyer is a 74 y.o. male with a history of elevated troponins in the context of urosepsis.  I initially evaluated him in July 2019.  Echocardiogram at that time demonstrated normal LV systolic function.  He was then evaluated in our office after being hospitalized and deferred any further cardiac testing including a stress test as he was asymptomatic.  The patient denies any symptoms of chest pain, palpitations, shortness of breath, lightheadedness, dizziness, leg swelling, orthopnea, PND, and syncope.    Past Medical History:  Diagnosis Date  . Anxiety   . Arthritis   . BPH (benign prostatic hyperplasia)   . Elevated troponin 01/30/2018   a. 01/2018: Type 2 NSTEMI most consistent with demand ischemia in the setting of Urosepsis.   . Hypercholesteremia    . Hypertension    Past Surgical History:  Procedure Laterality Date  . BACK SURGERY     lumbar disc  . CATARACT EXTRACTION W/PHACO Right 02/05/2017   Procedure: CATARACT EXTRACTION PHACO AND INTRAOCULAR LENS PLACEMENT (IOC);  Surgeon: Tonny Branch, MD;  Location: [facility];  Service: Ophthalmology;  Laterality: Right;  CDE: 7.36  . CATARACT EXTRACTION W/PHACO Left 02/19/2017   Procedure: CATARACT EXTRACTION PHACO AND INTRAOCULAR LENS PLACEMENT LEFT EYE;  Surgeon: Tonny Branch, MD;  Location: [facility];  Service: Ophthalmology;  Laterality: Left;  CDE: 7.98  . COLONOSCOPY    . COLONOSCOPY N/A 03/19/2018   Procedure: COLONOSCOPY;  Surgeon: Aviva Signs, MD;  [facility];  Service: Gastroenterology;  Laterality: N/A;  . COLONOSCOPY N/A 03/26/2018   Procedure: COLONOSCOPY;  Surgeon: Aviva Signs, MD;  [facility];  Service: Gastroenterology;  Laterality: N/A;  . INGUINAL HERNIA REPAIR Right 12/14/2017   Procedure: HERNIA REPAIR INGUINAL ADULT WITH MESH;  Surgeon: Aviva Signs, MD;  Location: [facility];  Service: General;  Laterality: Right;  . JOINT REPLACEMENT Left   . POLYPECTOMY  03/19/2018   Procedure: POLYPECTOMY;  Surgeon: Aviva Signs, MD;  [facility];  Service: Gastroenterology;;  . UMBILICAL HERNIA REPAIR N/A 12/14/2017   Procedure: HERNIA REPAIR UMBILICAL ADULT WITH MESH;  Surgeon: Aviva Signs, MD;  Location: [facility];  Service: General;  Laterality: N/A;     Current Meds  Medication Sig  . atorvastatin (LIPITOR) 40 MG tablet Take 40 mg by mouth every morning.   Marland Kitchen b complex vitamins tablet Take 1  tablet by mouth daily.  . Bilberry 150 MG CAPS Take 150 mg by mouth daily.  . citalopram (CELEXA) 40 MG tablet   . Coenzyme Q10 (COQ10) 100 MG CAPS Take 100 mg by mouth daily.  Marland Kitchen doxazosin (CARDURA) 4 MG tablet Take 4 mg by mouth at bedtime.  Marland Kitchen ezetimibe (ZETIA) 10 MG tablet Take 10 mg by mouth daily.  Marland Kitchen lisinopril (PRINIVIL,ZESTRIL) 20 MG tablet Take 20 mg by  mouth daily.  . meloxicam (MOBIC) 15 MG tablet Take 15 mg by mouth daily.  . Multiple Vitamins-Minerals (CENTRUM SILVER ADULT 50+ PO) Take 1 tablet by mouth daily.  . Omega-3 Fatty Acids (OMEGA-3 FISH OIL PO) Take 1 capsule by mouth daily.  . tamsulosin (FLOMAX) 0.4 MG CAPS capsule TAKE 1 CAPSULE BY MOUTH EVERY DAY AT BEDTIME  . vitamin C (ASCORBIC ACID) 500 MG tablet Take 500 mg by mouth daily.  . [DISCONTINUED] citalopram (CELEXA) 20 MG tablet Take 1 tablet (20 mg total) by mouth daily.  . [DISCONTINUED] meloxicam (MOBIC) 7.5 MG tablet Take 1 tablet (7.5 mg total) by mouth 2 (two) times daily as needed for pain. (Patient taking differently: Take 15 mg by mouth daily. )     Allergies:   Shrimp [shellfish allergy]   Social History   Tobacco Use  . Smoking status: Former Smoker    Packs/day: 1.00    Years: 20.00    Pack years: 20.00    Types: Cigarettes    Quit date: 01/29/2001    Years since quitting: 18.5  . Smokeless tobacco: Never Used  Substance Use Topics  . Alcohol use: Yes    Alcohol/week: 21.0 standard drinks    Types: 21 Cans of beer per week    Comment: states "drinks beer about every day"  . Drug use: No     Family Hx: The patient's family history includes Cancer in his father; Hypertension in his mother. There is no history of Colon cancer.  ROS:   Please see the history of present illness.     All other systems reviewed and are negative.   Prior CV studies:   The following studies were reviewed today:  Echocardiogram: 01/30/2018 Study Conclusions  - Left ventricle: The cavity size was normal. Wall thickness was increased in a pattern of mild LVH. Systolic function was normal. The estimated ejection fraction was in the range of 60% to 65%. Wall motion was normal; there were no regional wall motion abnormalities. Features are consistent with a pseudonormal left ventricular filling pattern, with concomitant abnormal relaxation and increased  filling pressure (grade 2 diastolic dysfunction). Doppler parameters are consistent with high ventricular filling pressure. - Aortic valve: Mildly thickened, mildly calcified leaflets. Sclerosis without stenosis. - Aorta: Minimally enlarged aortic root. - Left atrium: The atrium was moderately dilated. - Tricuspid valve: There was mild regurgitation. - Pulmonary arteries: PA peak pressure: 44 mm Hg (S). - Systemic veins: IVC is dilated with normal respiratory variation. Estimated CVP 8 mmHg.  Labs/Other Tests and Data Reviewed:    EKG:  No ECG reviewed.  Recent Labs: No results found for requested labs within last 8760 hours.   Recent Lipid Panel No results found for: CHOL, TRIG, HDL, CHOLHDL, LDLCALC, LDLDIRECT  Wt Readings from Last 3 Encounters:  08/25/19 248 lb (112.5 kg)  06/10/19 260 lb (117.9 kg)  03/25/18 260 lb (117.9 kg)     Objective:    Vital Signs:  BP (!) 143/79   Pulse 68   Ht 6\' 2"  (  1.88 m)   Wt 248 lb (112.5 kg)   BMI 31.84 kg/m    VITAL SIGNS:  reviewed  ASSESSMENT & PLAN:    1.  Elevated troponin: This occurred in the context of urosepsis.  Echocardiogram demonstrated normal LV systolic function.  He is entirely asymptomatic and does not wish to pursue any further cardiac testing at this time which I think is very reasonable.  He remains on statin therapy.  2.  Hypertension: Blood pressure is mildly elevated.  This will need further monitoring.  3.  Hyperlipidemia: Continue statin and ezetimibe.    COVID-19 Education: The signs and symptoms of COVID-19 were discussed with the patient and how to seek care for testing (follow up with PCP or arrange E-visit).  The importance of social distancing was discussed today.  Time:   Today, I have spent 5 minutes with the patient with telehealth technology discussing the above problems.     Medication Adjustments/Labs and Tests Ordered: Current medicines are reviewed at length with the patient  today.  Concerns regarding medicines are outlined above.   Tests Ordered: No orders of the defined types were placed in this encounter.   Medication Changes: No orders of the defined types were placed in this encounter.   Follow Up:  Virtual Visit  prn  Signed, Kate Sable, MD  08/25/2019 10:26 AM    [place]

## 2019-09-03 DIAGNOSIS — H35342 Macular cyst, hole, or pseudohole, left eye: Secondary | ICD-10-CM | POA: Diagnosis not present

## 2019-09-03 DIAGNOSIS — H26492 Other secondary cataract, left eye: Secondary | ICD-10-CM | POA: Diagnosis not present

## 2019-09-03 DIAGNOSIS — H33322 Round hole, left eye: Secondary | ICD-10-CM | POA: Diagnosis not present

## 2019-09-10 DIAGNOSIS — H43811 Vitreous degeneration, right eye: Secondary | ICD-10-CM | POA: Diagnosis not present

## 2019-10-01 DIAGNOSIS — E669 Obesity, unspecified: Secondary | ICD-10-CM | POA: Diagnosis not present

## 2019-10-01 DIAGNOSIS — N401 Enlarged prostate with lower urinary tract symptoms: Secondary | ICD-10-CM | POA: Diagnosis not present

## 2019-10-01 DIAGNOSIS — E782 Mixed hyperlipidemia: Secondary | ICD-10-CM | POA: Diagnosis not present

## 2019-10-01 DIAGNOSIS — E7849 Other hyperlipidemia: Secondary | ICD-10-CM | POA: Diagnosis not present

## 2019-10-01 DIAGNOSIS — Z6838 Body mass index (BMI) 38.0-38.9, adult: Secondary | ICD-10-CM | POA: Diagnosis not present

## 2019-10-01 DIAGNOSIS — I10 Essential (primary) hypertension: Secondary | ICD-10-CM | POA: Diagnosis not present

## 2019-10-01 DIAGNOSIS — Z0001 Encounter for general adult medical examination with abnormal findings: Secondary | ICD-10-CM | POA: Diagnosis not present

## 2019-10-01 DIAGNOSIS — N400 Enlarged prostate without lower urinary tract symptoms: Secondary | ICD-10-CM | POA: Diagnosis not present

## 2019-10-01 DIAGNOSIS — Z1389 Encounter for screening for other disorder: Secondary | ICD-10-CM | POA: Diagnosis not present

## 2019-10-02 DIAGNOSIS — H33322 Round hole, left eye: Secondary | ICD-10-CM | POA: Diagnosis not present

## 2019-10-02 DIAGNOSIS — H35342 Macular cyst, hole, or pseudohole, left eye: Secondary | ICD-10-CM | POA: Diagnosis not present

## 2019-12-03 ENCOUNTER — Encounter: Payer: Self-pay | Admitting: Family Medicine

## 2019-12-03 ENCOUNTER — Ambulatory Visit: Payer: Self-pay

## 2019-12-03 ENCOUNTER — Other Ambulatory Visit: Payer: Self-pay

## 2019-12-03 ENCOUNTER — Ambulatory Visit: Payer: Medicare HMO | Admitting: Family Medicine

## 2019-12-03 DIAGNOSIS — M25571 Pain in right ankle and joints of right foot: Secondary | ICD-10-CM

## 2019-12-03 DIAGNOSIS — M12571 Traumatic arthropathy, right ankle and foot: Secondary | ICD-10-CM

## 2019-12-03 MED ORDER — NABUMETONE 750 MG PO TABS: 750.0000 mg | ORAL_TABLET | Freq: Two times a day (BID) | ORAL | 3 refills | Status: DC | PRN

## 2019-12-03 NOTE — Progress Notes (Signed)
Office Visit Note   Patient: Jerry Boyer           Date of Birth: 10/27/1945           MRN: [id number] Visit Date: 12/03/2019 Requested by: Redmond School, [place],  [place] [id number] PCP: Redmond School, MD  Subjective: Chief Complaint  Patient presents with  . Right Ankle - Pain    Requesting cortisone injection. Had scheduled surgery with Dr. Sharol Given, but it got cancelled due to covid.    HPI: He is here with persistent right ankle pain.  He was going to have surgery but it got canceled due to COVID-19, and now he needs to wait until the same time next year to have surgery.  He would like to try another injection in the meantime.              ROS:   All other systems were reviewed and are negative.  Objective: Vital Signs: There were no vitals taken for this visit.  Physical Exam:  General:  Alert and oriented, in no acute distress. Pulm:  Breathing unlabored. Psy:  Normal mood, congruent affect. Skin: No erythema or rash Right ankle: He has mild to moderate swelling, tender over the anterolateral joint line.  Imaging: US Guided Needle Placement - No Linked Charges  Result Date: 12/03/2019 Ultrasound-guided right ankle injection: After sterile prep with Betadine, injected 5 cc 1% lidocaine without epinephrine and 40 mg methylprednisolone into the lateral ankle joint.  Good immediate relief.   Assessment & Plan: 1.  Right ankle DJD -Injection given as above.  Trial of Relafen.  He will take glucosamine and turmeric.  We can repeat injections as needed until he has surgery.     Procedures: No procedures performed  No notes on file     PMFS History: Patient Active Problem List   Diagnosis Date Noted  . Lumbar radiculopathy 01/22/2019  . Post laminectomy syndrome 01/22/2019  . 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 Spondylolisthesis of lumbar region 06/03/2018  . Hemorrhage of colon following colonoscopy   . Blood in stool 03/25/2018  .  Rectal bleeding   . Special screening for malignant neoplasms, colon   . Benign neoplasm of ascending colon   . Benign neoplasm of descending colon   . Rectal polyp   . NSTEMI (non-ST elevated myocardial infarction) ([facility]) 01/30/2018  . Sepsis ([facility]) 01/29/2018  . Acute lower UTI 01/29/2018  . HLD (hyperlipidemia) 01/29/2018  . HTN (hypertension) 01/29/2018  . Elevated troponin 01/29/2018  . Non-recurrent unilateral inguinal hernia without obstruction or gangrene   . Umbilical hernia without obstruction and without gangrene    Past Medical History:  Diagnosis Date  . Anxiety   . Arthritis   . BPH (benign prostatic hyperplasia)   . Elevated troponin 01/30/2018   a. 01/2018: Type 2 NSTEMI most consistent with demand ischemia in the setting of Urosepsis.   . Hypercholesteremia   . Hypertension     Family History  Problem Relation Age of Onset  . Hypertension Mother   . Cancer Father   . Colon cancer Neg Hx     Past Surgical History:  Procedure Laterality Date  . BACK SURGERY     lumbar disc  . CATARACT EXTRACTION W/PHACO Right 02/05/2017   Procedure: CATARACT EXTRACTION PHACO AND INTRAOCULAR LENS PLACEMENT (IOC);  Surgeon: Tonny Branch, MD;  Location: [facility];  Service: Ophthalmology;  Laterality: Right;  CDE: 7.36  . CATARACT EXTRACTION W/PHACO Left 02/19/2017  Procedure: CATARACT EXTRACTION PHACO AND INTRAOCULAR LENS PLACEMENT LEFT EYE;  Surgeon: Tonny Branch, MD;  Location: [facility];  Service: Ophthalmology;  Laterality: Left;  CDE: 7.98  . COLONOSCOPY    . COLONOSCOPY N/A 03/19/2018   Procedure: COLONOSCOPY;  Surgeon: Aviva Signs, MD;  Location: [facility];  Service: Gastroenterology;  Laterality: N/A;  . COLONOSCOPY N/A 03/26/2018   Procedure: COLONOSCOPY;  Surgeon: Aviva Signs, MD;  [facility]: [facility];  Service: Gastroenterology;  Laterality: N/A;  . INGUINAL HERNIA REPAIR Right 12/14/2017   Procedure: HERNIA REPAIR INGUINAL ADULT WITH MESH;  Surgeon: Aviva Signs, MD;   Location: [facility];  Service: General;  Laterality: Right;  . JOINT REPLACEMENT Left   . POLYPECTOMY  03/19/2018   Procedure: POLYPECTOMY;  Surgeon: Aviva Signs, MD;  [facility]: [facility];  Service: Gastroenterology;;  . UMBILICAL HERNIA REPAIR N/A 12/14/2017   Procedure: HERNIA REPAIR UMBILICAL ADULT WITH MESH;  Surgeon: Aviva Signs, MD;  Location: [facility];  Service: General;  Laterality: N/A;   Social History   Occupational History  . Not on file  Tobacco Use  . Smoking status: Former Smoker    Packs/day: 1.00    Years: 20.00    Pack years: 20.00    Types: Cigarettes    Quit date: 01/29/2001    Years since quitting: 18.8  . Smokeless tobacco: Never Used  Substance and Sexual Activity  . Alcohol use: Yes    Alcohol/week: 21.0 standard drinks    Types: 21 Cans of beer per week    Comment: states "drinks beer about every day"  . Drug use: No  . Sexual activity: Yes    Birth control/protection: None

## 2019-12-25 ENCOUNTER — Telehealth: Payer: Self-pay | Admitting: Physical Medicine and Rehabilitation

## 2019-12-25 NOTE — Telephone Encounter (Signed)
Patient called requesting a call back to set appointment to receive injection in back from Dr. Romona Curls office. Please cal patient about this matter. Patient phone number is [phone].

## 2019-12-25 NOTE — Telephone Encounter (Signed)
Bilateral S1 TF 05/21/2019. Ok to repeat if helped, same problem/side, and no new injury?

## 2019-12-25 NOTE — Telephone Encounter (Signed)
ok 

## 2019-12-25 NOTE — Telephone Encounter (Signed)
Needs auth for [id number]. Scheduled for 6/22 at 1415 with driver.

## 2019-12-30 NOTE — Telephone Encounter (Signed)
Approved Authorization [id number] [id number]) Effective Dates: 01/19/20-02/18/20

## 2020-01-20 ENCOUNTER — Ambulatory Visit: Payer: Self-pay

## 2020-01-20 ENCOUNTER — Other Ambulatory Visit: Payer: Self-pay

## 2020-01-20 ENCOUNTER — Ambulatory Visit: Payer: Medicare HMO | Admitting: Physical Medicine and Rehabilitation

## 2020-01-20 VITALS — BP 146/75 | HR 65 | Ht 74.0 in | Wt 250.0 lb

## 2020-01-20 DIAGNOSIS — M961 Postlaminectomy syndrome, not elsewhere classified: Secondary | ICD-10-CM

## 2020-01-20 MED ORDER — METHYLPREDNISOLONE ACETATE 80 MG/ML IJ SUSP
80.0000 mg | Freq: Once | INTRAMUSCULAR | Status: AC
  Administered 2020-01-20: 80 mg

## 2020-01-20 NOTE — Progress Notes (Signed)
Pt states pain is in the lower back on both sides. No leg pain. Pt states nothing helps pain. Pt states walking increases pain. 50% relief for 3 weeks from last injection   Numeric Pain Rating Scale and Functional Assessment Average Pain 5   In the last MONTH (on 0-10 scale) has pain interfered with the following?  1. General activity like being  able to carry out your everyday physical activities such as walking, climbing stairs, carrying groceries, or moving a chair?  Rating(9)   +Driver, -BT, -Dye Allergies.

## 2020-01-21 NOTE — Progress Notes (Signed)
Jerry Boyer - 74 y.o. male MRN [id number]  Date of birth: 10-Feb-1946  Office Visit Note: Visit Date: 01/20/2020 PCP: Redmond School, MD Referred by: Redmond School, MD  Subjective: Chief Complaint  Patient presents with  . Lower Back - Pain   HPI:  Jerry Boyer is a 74 y.o. male who comes in today for planned repeat Bilateral S1-2 Lumbar epidural steroid injection with fluoroscopic guidance.  The patient has failed conservative care including home exercise, medications, time and activity modification.  This injection will be diagnostic and hopefully therapeutic.  Please see requesting physician notes for further details and justification. Patient received more than 50% pain relief from prior injection.   Referring: Dr. Laurence Spates   Last injection was in October he got more than 50% relief for 3 weeks and has had slow return of symptoms since that time. He is really has been putting things off I think. He was scheduled to have ankle surgery by Dr. Meridee Score but this was postponed by the coronavirus issues. I have asked him to follow-up with them to see if they can really do the surgery now that that should not be an issue. In terms of his spine we will repeat the injection one time and then if that does not seem to help very much we would look at MRI of the lumbar spine should he call back. Last MRI was in 2016 and it was no Enoc nerve compression at that time but he did have a grade 1 listhesis to the pars defects.   ROS Otherwise per HPI.  Assessment & Plan: Visit Diagnoses:  1. Post laminectomy syndrome     Plan: No additional findings.   Meds & Orders:  Meds ordered this encounter  Medications  . methylPREDNISolone acetate (DEPO-MEDROL) injection 80 mg    Orders Placed This Encounter  Procedures  . XR C-ARM NO REPORT  . Epidural Steroid injection    Follow-up: Return for Will need MRI prior to next injection.   Procedures: No procedures performed  S1  Lumbosacral Transforaminal Epidural Steroid Injection - Sub-Pedicular Approach with Fluoroscopic Guidance   Patient: Jerry Boyer      Date of Birth: June 17, 1946 MRN: [id number] PCP: Redmond School, MD      Visit Date: 01/20/2020   Universal Protocol:    Date/Time: 06/23/215:31 AM  Consent Given By: the patient  Position:  PRONE  Additional Comments: Vital signs were monitored before and after the procedure. Patient was prepped and draped in the usual sterile fashion. The correct patient, procedure, and site was verified.   Injection Procedure Details:  Procedure Site One Meds Administered:  Meds ordered this encounter  Medications  . methylPREDNISolone acetate (DEPO-MEDROL) injection 80 mg    Laterality: Bilateral  Location/Site:  S1 Foramen   Needle size: 22 ga.  Needle type: Spinal  Needle Placement: Transforaminal  Findings:   -Comments: Excellent flow of contrast along the nerve and into the epidural space.  Epidurogram: Contrast epidurogram showed no nerve root cut off or restricted flow pattern.  Procedure Details: After squaring off the sacral end-plate to get a true AP view, the C-arm was positioned so that the best possible view of the S1 foramen was visualized. The soft tissues overlying this structure were infiltrated with 2-3 ml. of 1% Lidocaine without Epinephrine.    The spinal needle was inserted toward the target using a "trajectory" view along the fluoroscope beam.  Under AP and lateral visualization, the needle was  advanced so it did not puncture dura. Biplanar projections were used to confirm position. Aspiration was confirmed to be negative for CSF and/or blood. A 1-2 ml. volume of Isovue-250 was injected and flow of contrast was noted at each level. Radiographs were obtained for documentation purposes.   After attaining the desired flow of contrast documented above, a 0.5 to 1.0 ml test dose of 0.25% Marcaine was injected into each respective  transforaminal space.  The patient was observed for 90 seconds post injection.  After no sensory deficits were reported, and normal lower extremity motor function was noted,   the above injectate was administered so that equal amounts of the injectate were placed at each foramen (level) into the transforaminal epidural space.   Additional Comments:  The patient tolerated the procedure well Dressing: Band-Aid with 2 x 2 sterile gauze    Post-procedure details: Patient was observed during the procedure. Post-procedure instructions were reviewed.  Patient left the clinic in stable condition.     Clinical History: EXAM: MRI LUMBAR SPINE WITHOUT CONTRAST  TECHNIQUE: Multiplanar, multisequence MR imaging of the lumbar spine was performed. No intravenous contrast was administered.  COMPARISON:  MRI lumbar spine 02/07/2012.  FINDINGS: Segmentation: 5 lumbar vertebral bodies are present.  Alignment: 5 mm anterolisthesis L5 on S1 related to pars defects. Minor scoliosis convex RIGHT upper lumbar region related to asymmetric loss of interspace height at L4-5 on the RIGHT.  Vertebrae: No worrisome osseous lesion.Moderate endplate reactive change of a degenerative nature at L2-3, L3-4, and L4-5.  Conus medullaris: Normal in size, signal, and location.  Paraspinal tissues: No evidence for hydronephrosis or paravertebral mass.  Disc levels:  L1-L2:  Mild bulge.  No impingement.  L2-L3: Central protrusion with BILATERAL foraminal extension. This is greater on the LEFT with LEFT-sided spurring. BILATERAL facet arthropathy. LEFT greater than RIGHT L2 and L3 nerve root impingement is possible.  L3-L4: Central protrusion. BILATERAL facet arthropathy. Mild foraminal and subarticular zone narrowing without clear-cut neural impingement, worse on the LEFT.  L4-L5: Disc space narrowing. Central disc osteophyte complex. Mild facet arthropathy. Postsurgical changes on the  LEFT. No definite impingement.  L5-S1: 5 mm anterolisthesis. BILATERAL L5 spondylolysis. No definite subarticular zone or foraminal zone impingement. Compared with 2013, a similar appearance is noted.  IMPRESSION: Multilevel spondylosis as described.  Similar appearance to 2013.  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 Consider lateral standing flexion extension views to evaluate for dynamic instability.   Electronically Signed   By: Rolla Flatten M.D.   On: 11/18/2014 09:25     Objective:  VS:  HT:6\' 2"  (188 cm)   WT:250 lb (113.4 kg)  BMI:32.08    BP:(!) 146/75  HR:65bpm  TEMP: ( )  RESP:  Physical Exam Constitutional:      General: He is not in acute distress.    Appearance: Normal appearance. He is obese. He is not ill-appearing.  HENT:     Head: Normocephalic and atraumatic.     Right Ear: External ear normal.     Left Ear: External ear normal.  Eyes:     Extraocular Movements: Extraocular movements intact.  Cardiovascular:     Rate and Rhythm: Normal rate.     Pulses: Normal pulses.  Abdominal:     General: There is no distension.     Palpations: Abdomen is soft.  Musculoskeletal:        General: No tenderness or signs of injury.     Right lower leg: No edema.  Left lower leg: No edema.     Comments: Patient has good distal strength without clonus.  Skin:    Findings: No erythema or rash.  Neurological:     General: No focal deficit present.     Mental Status: He is alert and oriented to person, place, and time.     Sensory: No sensory deficit.     Motor: No weakness or abnormal muscle tone.     Coordination: Coordination normal.  Psychiatric:        Mood and Affect: Mood normal.        Behavior: Behavior normal.      Imaging: XR C-ARM NO REPORT  Result Date: 01/20/2020 Please see Notes tab for imaging impression.

## 2020-01-21 NOTE — Procedures (Signed)
S1 Lumbosacral Transforaminal Epidural Steroid Injection - Sub-Pedicular Approach with Fluoroscopic Guidance   Patient: Jerry Boyer      Date of Birth: [birth year] MRN: [id number] PCP: Redmond School, MD      Visit Date: 01/20/2020   Universal Protocol:    Date/Time: 06/23/215:31 AM  Consent Given By: the patient  Position:  PRONE  Additional Comments: Vital signs were monitored before and after the procedure. Patient was prepped and draped in the usual sterile fashion. The correct patient, procedure, and site was verified.   Injection Procedure Details:  Procedure Site One Meds Administered:  Meds ordered this encounter  Medications  . methylPREDNISolone acetate (DEPO-MEDROL) injection 80 mg    Laterality: Bilateral  Location/Site:  S1 Foramen   Needle size: 22 ga.  Needle type: Spinal  Needle Placement: Transforaminal  Findings:   -Comments: Excellent flow of contrast along the nerve and into the epidural space.  Epidurogram: Contrast epidurogram showed no nerve root cut off or restricted flow pattern.  Procedure Details: After squaring off the sacral end-plate to get a true AP view, the C-arm was positioned so that the best possible view of the S1 foramen was visualized. The soft tissues overlying this structure were infiltrated with 2-3 ml. of 1% Lidocaine without Epinephrine.    The spinal needle was inserted toward the target using a "trajectory" view along the fluoroscope beam.  Under AP and lateral visualization, the needle was advanced so it did not puncture dura. Biplanar projections were used to confirm position. Aspiration was confirmed to be negative for CSF and/or blood. A 1-2 ml. volume of Isovue-250 was injected and flow of contrast was noted at each level. Radiographs were obtained for documentation purposes.   After attaining the desired flow of contrast documented above, a 0.5 to 1.0 ml test dose of 0.25% Marcaine was injected into each  respective transforaminal space.  The patient was observed for 90 seconds post injection.  After no sensory deficits were reported, and normal lower extremity motor function was noted,   the above injectate was administered so that equal amounts of the injectate were placed at each foramen (level) into the transforaminal epidural space.   Additional Comments:  The patient tolerated the procedure well Dressing: Band-Aid with 2 x 2 sterile gauze    Post-procedure details: Patient was observed during the procedure. Post-procedure instructions were reviewed.  Patient left the clinic in stable condition.

## 2020-03-30 DIAGNOSIS — I10 Essential (primary) hypertension: Secondary | ICD-10-CM | POA: Diagnosis not present

## 2020-03-30 DIAGNOSIS — E7849 Other hyperlipidemia: Secondary | ICD-10-CM | POA: Diagnosis not present

## 2020-03-30 DIAGNOSIS — N401 Enlarged prostate with lower urinary tract symptoms: Secondary | ICD-10-CM | POA: Diagnosis not present

## 2020-04-07 DIAGNOSIS — H35342 Macular cyst, hole, or pseudohole, left eye: Secondary | ICD-10-CM | POA: Diagnosis not present

## 2020-04-28 DIAGNOSIS — Z23 Encounter for immunization: Secondary | ICD-10-CM | POA: Diagnosis not present

## 2020-05-29 DIAGNOSIS — I10 Essential (primary) hypertension: Secondary | ICD-10-CM | POA: Diagnosis not present

## 2020-05-29 DIAGNOSIS — E782 Mixed hyperlipidemia: Secondary | ICD-10-CM | POA: Diagnosis not present

## 2020-06-29 DIAGNOSIS — I10 Essential (primary) hypertension: Secondary | ICD-10-CM | POA: Diagnosis not present

## 2020-06-29 DIAGNOSIS — E7849 Other hyperlipidemia: Secondary | ICD-10-CM | POA: Diagnosis not present

## 2020-07-30 DIAGNOSIS — E782 Mixed hyperlipidemia: Secondary | ICD-10-CM | POA: Diagnosis not present

## 2020-07-30 DIAGNOSIS — I10 Essential (primary) hypertension: Secondary | ICD-10-CM | POA: Diagnosis not present

## 2020-08-28 DIAGNOSIS — I10 Essential (primary) hypertension: Secondary | ICD-10-CM | POA: Diagnosis not present

## 2020-08-28 DIAGNOSIS — E782 Mixed hyperlipidemia: Secondary | ICD-10-CM | POA: Diagnosis not present

## 2020-10-01 DIAGNOSIS — Z0000 Encounter for general adult medical examination without abnormal findings: Secondary | ICD-10-CM | POA: Diagnosis not present

## 2020-10-01 DIAGNOSIS — Z1331 Encounter for screening for depression: Secondary | ICD-10-CM | POA: Diagnosis not present

## 2020-10-01 DIAGNOSIS — Z6839 Body mass index (BMI) 39.0-39.9, adult: Secondary | ICD-10-CM | POA: Diagnosis not present

## 2020-10-01 DIAGNOSIS — I10 Essential (primary) hypertension: Secondary | ICD-10-CM | POA: Diagnosis not present

## 2020-10-01 DIAGNOSIS — E7849 Other hyperlipidemia: Secondary | ICD-10-CM | POA: Diagnosis not present

## 2020-10-01 DIAGNOSIS — Z1389 Encounter for screening for other disorder: Secondary | ICD-10-CM | POA: Diagnosis not present

## 2020-10-01 DIAGNOSIS — N401 Enlarged prostate with lower urinary tract symptoms: Secondary | ICD-10-CM | POA: Diagnosis not present

## 2020-10-01 DIAGNOSIS — I700 Atherosclerosis of aorta: Secondary | ICD-10-CM | POA: Diagnosis not present

## 2020-10-01 DIAGNOSIS — Z0001 Encounter for general adult medical examination with abnormal findings: Secondary | ICD-10-CM | POA: Diagnosis not present

## 2020-11-27 DIAGNOSIS — E7849 Other hyperlipidemia: Secondary | ICD-10-CM | POA: Diagnosis not present

## 2020-11-27 DIAGNOSIS — I10 Essential (primary) hypertension: Secondary | ICD-10-CM | POA: Diagnosis not present

## 2020-12-28 DIAGNOSIS — E782 Mixed hyperlipidemia: Secondary | ICD-10-CM | POA: Diagnosis not present

## 2020-12-28 DIAGNOSIS — N182 Chronic kidney disease, stage 2 (mild): Secondary | ICD-10-CM | POA: Diagnosis not present

## 2020-12-28 DIAGNOSIS — J449 Chronic obstructive pulmonary disease, unspecified: Secondary | ICD-10-CM | POA: Diagnosis not present

## 2020-12-28 DIAGNOSIS — I10 Essential (primary) hypertension: Secondary | ICD-10-CM | POA: Diagnosis not present

## 2021-01-06 ENCOUNTER — Encounter: Payer: Self-pay | Admitting: *Deleted

## 2021-03-10 ENCOUNTER — Encounter: Payer: Self-pay | Admitting: Internal Medicine

## 2021-03-31 ENCOUNTER — Encounter: Payer: Self-pay | Admitting: General Surgery

## 2021-03-31 ENCOUNTER — Ambulatory Visit: Payer: Medicare HMO | Admitting: General Surgery

## 2021-03-31 ENCOUNTER — Other Ambulatory Visit: Payer: Self-pay

## 2021-03-31 VITALS — BP 164/83 | HR 62 | Temp 98.3°F | Resp 16 | Ht 74.0 in | Wt 277.0 lb

## 2021-03-31 DIAGNOSIS — Z86010 Personal history of colonic polyps: Secondary | ICD-10-CM | POA: Diagnosis not present

## 2021-03-31 MED ORDER — SUTAB 1479-225-188 MG PO TABS: 1.0000 | ORAL_TABLET | Freq: Once | ORAL | 0 refills | Status: AC

## 2021-03-31 NOTE — H&P (Signed)
Jerry Boyer; [id number]; [birth year]   HPI Patient is a 75 year old white male who was referred to my care by Dr. Gerarda Fraction for follow-up colonoscopy for a history of colon polyps.  He had multiple tubular adenomas of the polyp removed in 2019.  He did have to have a repeat colonoscopy 1 week later due to GI bleeding.  No active bleeding was noted on the follow-up colonoscopy.  Since that time, he has not had any significant blood per rectum, abnormal weight loss, nausea, vomiting, diarrhea, constipation.  He does take a baby aspirin daily. Past Medical History:  Diagnosis Date   Anxiety    Arthritis    BPH (benign prostatic hyperplasia)    Elevated troponin 01/30/2018   a. 01/2018: Type 2 NSTEMI most consistent with demand ischemia in the setting of Urosepsis.    Hypercholesteremia    Hypertension     Past Surgical History:  Procedure Laterality Date   BACK SURGERY     lumbar disc   CATARACT EXTRACTION W/PHACO Right 02/05/2017   Procedure: CATARACT EXTRACTION PHACO AND INTRAOCULAR LENS PLACEMENT (IOC);  Surgeon: Tonny Branch, MD;  Location: [facility];  Service: Ophthalmology;  Laterality: Right;  CDE: 7.36   CATARACT EXTRACTION W/PHACO Left 02/19/2017   Procedure: CATARACT EXTRACTION PHACO AND INTRAOCULAR LENS PLACEMENT LEFT EYE;  Surgeon: Tonny Branch, MD;  Location: [facility];  Service: Ophthalmology;  Laterality: Left;  CDE: 7.98   COLONOSCOPY     COLONOSCOPY N/A 03/19/2018   Procedure: COLONOSCOPY;  Surgeon: Aviva Signs, MD;  [facility];  Service: Gastroenterology;  Laterality: N/A;   COLONOSCOPY N/A 03/26/2018   Procedure: COLONOSCOPY;  Surgeon: Aviva Signs, MD;  [facility];  Service: Gastroenterology;  Laterality: N/A;   INGUINAL HERNIA REPAIR Right 12/14/2017   Procedure: HERNIA REPAIR INGUINAL ADULT WITH MESH;  Surgeon: Aviva Signs, MD;  Location: [facility];  Service: General;  Laterality: Right;   JOINT REPLACEMENT Left    POLYPECTOMY  03/19/2018   Procedure:  POLYPECTOMY;  Surgeon: Aviva Signs, MD;  [facility];  Service: Gastroenterology;;   UMBILICAL HERNIA REPAIR N/A 12/14/2017   Procedure: HERNIA REPAIR UMBILICAL ADULT WITH MESH;  Surgeon: Aviva Signs, MD;  Location: [facility];  Service: General;  Laterality: N/A;    Family History  Problem Relation Age of Onset   Hypertension Mother    Cancer Father    Colon cancer Neg Hx     Current Outpatient Medications on File Prior to Visit  Medication Sig Dispense Refill   acetaminophen (TYLENOL) 500 MG tablet Take 500 mg by mouth every 6 (six) hours as needed.     atorvastatin (LIPITOR) 40 MG tablet Take 40 mg by mouth every morning.      b complex vitamins tablet Take 1 tablet by mouth daily.     Bilberry 150 MG CAPS Take 150 mg by mouth daily.     citalopram (CELEXA) 40 MG tablet      Coenzyme Q10 (COQ10) 100 MG CAPS Take 100 mg by mouth daily.     doxazosin (CARDURA) 4 MG tablet Take 4 mg by mouth at bedtime.     ezetimibe (ZETIA) 10 MG tablet Take 10 mg by mouth daily.     lisinopril (PRINIVIL,ZESTRIL) 20 MG tablet Take 20 mg by mouth daily.     Multiple Vitamins-Minerals (CENTRUM SILVER ADULT 50+ PO) Take 1 tablet by mouth daily.     nabumetone (RELAFEN) 750 MG tablet Take 1 tablet (750 mg total)  by mouth 2 (two) times daily as needed. 180 tablet 3   Omega-3 Fatty Acids (OMEGA-3 FISH OIL PO) Take 1 capsule by mouth daily.     tamsulosin (FLOMAX) 0.4 MG CAPS capsule TAKE 1 CAPSULE BY MOUTH EVERY DAY AT BEDTIME     vitamin C (ASCORBIC ACID) 500 MG tablet Take 500 mg by mouth daily.     No current facility-administered medications on file prior to visit.    Allergies  Allergen Reactions   Shrimp [Shellfish Allergy] Anaphylaxis and Rash    Social History   Substance and Sexual Activity  Alcohol Use Yes   Alcohol/week: 21.0 standard drinks   Types: 21 Cans of beer per week   Comment: states "drinks beer about every day"    Social History   Tobacco Use  Smoking  Status Former   Packs/day: 1.00   Years: 20.00   Pack years: 20.00   Types: Cigarettes   Quit date: 01/29/2001   Years since quitting: 20.1  Smokeless Tobacco Never    Review of Systems  Constitutional: Negative.   HENT: Negative.    Eyes: Negative.   Respiratory: Negative.    Cardiovascular: Negative.   Gastrointestinal: Negative.   Genitourinary:  Positive for frequency.  Musculoskeletal:  Positive for back pain and joint pain.  Skin: Negative.   Neurological: Negative.   Endo/Heme/Allergies: Negative.   Psychiatric/Behavioral: Negative.     Objective   Vitals:   03/31/21 0919  BP: (!) 164/83  Pulse: 62  Resp: 16  Temp: 98.3 F (36.8 C)  SpO2: 94%    Physical Exam Vitals reviewed.  Constitutional:      Appearance: Normal appearance. He is not ill-appearing.  HENT:     Head: Normocephalic and atraumatic.  Cardiovascular:     Rate and Rhythm: Normal rate and regular rhythm.     Heart sounds: Normal heart sounds. No murmur heard.   No gallop.  Pulmonary:     Effort: Pulmonary effort is normal. No respiratory distress.     Breath sounds: Normal breath sounds. No stridor. No wheezing, rhonchi or rales.  Abdominal:     General: Bowel sounds are normal. There is no distension.     Palpations: Abdomen is soft. There is no mass.     Tenderness: There is no abdominal tenderness. There is no guarding or rebound.     Hernia: No hernia is present.  Skin:    General: Skin is warm and dry.  Neurological:     Mental Status: He is alert and oriented to person, place, and time.   Previous colonoscopy reports reviewed.  Pathology reports reviewed Assessment  History of colon polyps Plan  Patient is scheduled for a colonoscopy on 04/19/2021.  The risks and benefits of the procedure including bleeding and perforation were fully explained to the patient, who gave informed consent.  He was instructed to stop his aspirin 1 week prior to the procedure.  Sutabs have been  prescribed for bowel preparation.

## 2021-03-31 NOTE — Progress Notes (Addendum)
Jerry Boyer; [id number]; [birth year]   HPI Patient is a 75 year old white male who was referred to my care by Dr. Gerarda Fraction for follow-up colonoscopy for a history of colon polyps.  He had multiple tubular adenomas of the polyp removed in 2019.  He did have to have a repeat colonoscopy 1 week later due to GI bleeding.  No active bleeding was noted on the follow-up colonoscopy.  Since that time, he has not had any significant blood per rectum, abnormal weight loss, nausea, vomiting, diarrhea, constipation.  He does take a baby aspirin daily. Past Medical History:  Diagnosis Date   Anxiety    Arthritis    BPH (benign prostatic hyperplasia)    Elevated troponin 01/30/2018   a. 01/2018: Type 2 NSTEMI most consistent with demand ischemia in the setting of Urosepsis.    Hypercholesteremia    Hypertension     Past Surgical History:  Procedure Laterality Date   BACK SURGERY     lumbar disc   CATARACT EXTRACTION W/PHACO Right 02/05/2017   Procedure: CATARACT EXTRACTION PHACO AND INTRAOCULAR LENS PLACEMENT (IOC);  Surgeon: Tonny Branch, MD;  Location: [facility];  Service: Ophthalmology;  Laterality: Right;  CDE: 7.36   CATARACT EXTRACTION W/PHACO Left 02/19/2017   Procedure: CATARACT EXTRACTION PHACO AND INTRAOCULAR LENS PLACEMENT LEFT EYE;  Surgeon: Tonny Branch, MD;  Location: [facility];  Service: Ophthalmology;  Laterality: Left;  CDE: 7.98   COLONOSCOPY     COLONOSCOPY N/A 03/19/2018   Procedure: COLONOSCOPY;  Surgeon: Aviva Signs, MD;  Location: [facility];  Service: Gastroenterology;  Laterality: N/A;   COLONOSCOPY N/A 03/26/2018   Procedure: COLONOSCOPY;  Surgeon: Aviva Signs, MD;  [facility]: [facility];  Service: Gastroenterology;  Laterality: N/A;   INGUINAL HERNIA REPAIR Right 12/14/2017   Procedure: HERNIA REPAIR INGUINAL ADULT WITH MESH;  Surgeon: Aviva Signs, MD;  Location: [facility];  Service: General;  Laterality: Right;   JOINT REPLACEMENT Left    POLYPECTOMY  03/19/2018   Procedure:  POLYPECTOMY;  Surgeon: Aviva Signs, MD;  [facility]: [facility];  Service: Gastroenterology;;   UMBILICAL HERNIA REPAIR N/A 12/14/2017   Procedure: HERNIA REPAIR UMBILICAL ADULT WITH MESH;  Surgeon: Aviva Signs, MD;  Location: [facility];  Service: General;  Laterality: N/A;    Family History  Problem Relation Age of Onset   Hypertension Mother    Cancer Father    Colon cancer Neg Hx     Current Outpatient Medications on File Prior to Visit  Medication Sig Dispense Refill   acetaminophen (TYLENOL) 500 MG tablet Take 500 mg by mouth every 6 (six) hours as needed.     atorvastatin (LIPITOR) 40 MG tablet Take 40 mg by mouth every morning.      b complex vitamins tablet Take 1 tablet by mouth daily.     Bilberry 150 MG CAPS Take 150 mg by mouth daily.     citalopram (CELEXA) 40 MG tablet      Coenzyme Q10 (COQ10) 100 MG CAPS Take 100 mg by mouth daily.     doxazosin (CARDURA) 4 MG tablet Take 4 mg by mouth at bedtime.     ezetimibe (ZETIA) 10 MG tablet Take 10 mg by mouth daily.     lisinopril (PRINIVIL,ZESTRIL) 20 MG tablet Take 20 mg by mouth daily.     Multiple Vitamins-Minerals (CENTRUM SILVER ADULT 50+ PO) Take 1 tablet by mouth daily.     nabumetone (RELAFEN) 750 MG tablet Take 1 tablet (750 mg total)  by mouth 2 (two) times daily as needed. 180 tablet 3   Omega-3 Fatty Acids (OMEGA-3 FISH OIL PO) Take 1 capsule by mouth daily.     tamsulosin (FLOMAX) 0.4 MG CAPS capsule TAKE 1 CAPSULE BY MOUTH EVERY DAY AT BEDTIME     vitamin C (ASCORBIC ACID) 500 MG tablet Take 500 mg by mouth daily.     No current facility-administered medications on file prior to visit.    Allergies  Allergen Reactions   Shrimp [Shellfish Allergy] Anaphylaxis and Rash    Social History   Substance and Sexual Activity  Alcohol Use Yes   Alcohol/week: 21.0 standard drinks   Types: 21 Cans of beer per week   Comment: states "drinks beer about every day"    Social History   Tobacco Use  Smoking  Status Former   Packs/day: 1.00   Years: 20.00   Pack years: 20.00   Types: Cigarettes   Quit date: 01/29/2001   Years since quitting: 20.1  Smokeless Tobacco Never    Review of Systems  Constitutional: Negative.   HENT: Negative.    Eyes: Negative.   Respiratory: Negative.    Cardiovascular: Negative.   Gastrointestinal: Negative.   Genitourinary:  Positive for frequency.  Musculoskeletal:  Positive for back pain and joint pain.  Skin: Negative.   Neurological: Negative.   Endo/Heme/Allergies: Negative.   Psychiatric/Behavioral: Negative.     Objective   Vitals:   03/31/21 0919  BP: (!) 164/83  Pulse: 62  Resp: 16  Temp: 98.3 F (36.8 C)  SpO2: 94%    Physical Exam Vitals reviewed.  Constitutional:      Appearance: Normal appearance. He is not ill-appearing.  HENT:     Head: Normocephalic and atraumatic.  Cardiovascular:     Rate and Rhythm: Normal rate and regular rhythm.     Heart sounds: Normal heart sounds. No murmur heard.   No gallop.  Pulmonary:     Effort: Pulmonary effort is normal. No respiratory distress.     Breath sounds: Normal breath sounds. No stridor. No wheezing, rhonchi or rales.  Abdominal:     General: Bowel sounds are normal. There is no distension.     Palpations: Abdomen is soft. There is no mass.     Tenderness: There is no abdominal tenderness. There is no guarding or rebound.     Hernia: No hernia is present.  Skin:    General: Skin is warm and dry.  Neurological:     Mental Status: He is alert and oriented to person, place, and time.   Previous colonoscopy reports reviewed.  Pathology reports reviewed Assessment  History of colon polyps Plan  Patient is scheduled for a colonoscopy on 04/19/2021.  The risks and benefits of the procedure including bleeding and perforation were fully explained to the patient, who gave informed consent.  He was instructed to stop his aspirin 1 week prior to the procedure.  Sutabs have been  prescribed for bowel preparation.

## 2021-04-06 DIAGNOSIS — M1991 Primary osteoarthritis, unspecified site: Secondary | ICD-10-CM | POA: Diagnosis not present

## 2021-04-06 DIAGNOSIS — Z23 Encounter for immunization: Secondary | ICD-10-CM | POA: Diagnosis not present

## 2021-04-06 DIAGNOSIS — J069 Acute upper respiratory infection, unspecified: Secondary | ICD-10-CM | POA: Diagnosis not present

## 2021-04-06 DIAGNOSIS — H6690 Otitis media, unspecified, unspecified ear: Secondary | ICD-10-CM | POA: Diagnosis not present

## 2021-04-06 DIAGNOSIS — H6090 Unspecified otitis externa, unspecified ear: Secondary | ICD-10-CM | POA: Diagnosis not present

## 2021-04-06 DIAGNOSIS — Z6838 Body mass index (BMI) 38.0-38.9, adult: Secondary | ICD-10-CM | POA: Diagnosis not present

## 2021-04-19 ENCOUNTER — Ambulatory Visit (HOSPITAL_COMMUNITY): Admission: RE | Admit: 2021-04-19 | Payer: Medicare HMO | Source: Home / Self Care | Admitting: General Surgery

## 2021-04-19 ENCOUNTER — Encounter (HOSPITAL_COMMUNITY): Admission: RE | Payer: Self-pay | Source: Home / Self Care

## 2021-04-19 SURGERY — COLONOSCOPY
Anesthesia: Moderate Sedation

## 2021-04-22 ENCOUNTER — Ambulatory Visit: Payer: Medicare HMO | Admitting: Gastroenterology

## 2021-05-09 NOTE — H&P (Signed)
Jerry Boyer; [id number]; [birth year]   HPI Patient is a 75 year old white male who was referred to my care by Dr. Gerarda Fraction for follow-up colonoscopy for a history of colon polyps.  He had multiple tubular adenomas of the polyp removed in 2019.  He did have to have a repeat colonoscopy 1 week later due to GI bleeding.  No active bleeding was noted on the follow-up colonoscopy.  Since that time, he has not had any significant blood per rectum, abnormal weight loss, nausea, vomiting, diarrhea, constipation.  He does take a baby aspirin daily. Past Medical History:  Diagnosis Date   Anxiety    Arthritis    BPH (benign prostatic hyperplasia)    Elevated troponin 01/30/2018   a. 01/2018: Type 2 NSTEMI most consistent with demand ischemia in the setting of Urosepsis.    Hypercholesteremia    Hypertension     Past Surgical History:  Procedure Laterality Date   BACK SURGERY     lumbar disc   CATARACT EXTRACTION W/PHACO Right 02/05/2017   Procedure: CATARACT EXTRACTION PHACO AND INTRAOCULAR LENS PLACEMENT (IOC);  Surgeon: Tonny Branch, MD;  Location: [facility];  Service: Ophthalmology;  Laterality: Right;  CDE: 7.36   CATARACT EXTRACTION W/PHACO Left 02/19/2017   Procedure: CATARACT EXTRACTION PHACO AND INTRAOCULAR LENS PLACEMENT LEFT EYE;  Surgeon: Tonny Branch, MD;  Location: [facility];  Service: Ophthalmology;  Laterality: Left;  CDE: 7.98   COLONOSCOPY     COLONOSCOPY N/A 03/19/2018   Procedure: COLONOSCOPY;  Surgeon: Aviva Signs, MD;  [facility];  Service: Gastroenterology;  Laterality: N/A;   COLONOSCOPY N/A 03/26/2018   Procedure: COLONOSCOPY;  Surgeon: Aviva Signs, MD;  [facility];  Service: Gastroenterology;  Laterality: N/A;   INGUINAL HERNIA REPAIR Right 12/14/2017   Procedure: HERNIA REPAIR INGUINAL ADULT WITH MESH;  Surgeon: Aviva Signs, MD;  Location: [facility];  Service: General;  Laterality: Right;   JOINT REPLACEMENT Left    POLYPECTOMY  03/19/2018   Procedure:  POLYPECTOMY;  Surgeon: Aviva Signs, MD;  [facility];  Service: Gastroenterology;;   UMBILICAL HERNIA REPAIR N/A 12/14/2017   Procedure: HERNIA REPAIR UMBILICAL ADULT WITH MESH;  Surgeon: Aviva Signs, MD;  Location: [facility];  Service: General;  Laterality: N/A;    Family History  Problem Relation Age of Onset   Hypertension Mother    Cancer Father    Colon cancer Neg Hx     Current Outpatient Medications on File Prior to Visit  Medication Sig Dispense Refill   acetaminophen (TYLENOL) 500 MG tablet Take 500 mg by mouth every 6 (six) hours as needed.     atorvastatin (LIPITOR) 40 MG tablet Take 40 mg by mouth every morning.      b complex vitamins tablet Take 1 tablet by mouth daily.     Bilberry 150 MG CAPS Take 150 mg by mouth daily.     citalopram (CELEXA) 40 MG tablet      Coenzyme Q10 (COQ10) 100 MG CAPS Take 100 mg by mouth daily.     doxazosin (CARDURA) 4 MG tablet Take 4 mg by mouth at bedtime.     ezetimibe (ZETIA) 10 MG tablet Take 10 mg by mouth daily.     lisinopril (PRINIVIL,ZESTRIL) 20 MG tablet Take 20 mg by mouth daily.     Multiple Vitamins-Minerals (CENTRUM SILVER ADULT 50+ PO) Take 1 tablet by mouth daily.     nabumetone (RELAFEN) 750 MG tablet Take 1 tablet (750 mg total)  by mouth 2 (two) times daily as needed. 180 tablet 3   Omega-3 Fatty Acids (OMEGA-3 FISH OIL PO) Take 1 capsule by mouth daily.     tamsulosin (FLOMAX) 0.4 MG CAPS capsule TAKE 1 CAPSULE BY MOUTH EVERY DAY AT BEDTIME     vitamin C (ASCORBIC ACID) 500 MG tablet Take 500 mg by mouth daily.     No current facility-administered medications on file prior to visit.    Allergies  Allergen Reactions   Shrimp [Shellfish Allergy] Anaphylaxis and Rash    Social History   Substance and Sexual Activity  Alcohol Use Yes   Alcohol/week: 21.0 standard drinks   Types: 21 Cans of beer per week   Comment: states "drinks beer about every day"    Social History   Tobacco Use  Smoking  Status Former   Packs/day: 1.00   Years: 20.00   Pack years: 20.00   Types: Cigarettes   Quit date: 01/29/2001   Years since quitting: 20.1  Smokeless Tobacco Never    Review of Systems  Constitutional: Negative.   HENT: Negative.    Eyes: Negative.   Respiratory: Negative.    Cardiovascular: Negative.   Gastrointestinal: Negative.   Genitourinary:  Positive for frequency.  Musculoskeletal:  Positive for back pain and joint pain.  Skin: Negative.   Neurological: Negative.   Endo/Heme/Allergies: Negative.   Psychiatric/Behavioral: Negative.     Objective   Vitals:   03/31/21 0919  BP: (!) 164/83  Pulse: 62  Resp: 16  Temp: 98.3 F (36.8 C)  SpO2: 94%    Physical Exam Vitals reviewed.  Constitutional:      Appearance: Normal appearance. He is not ill-appearing.  HENT:     Head: Normocephalic and atraumatic.  Cardiovascular:     Rate and Rhythm: Normal rate and regular rhythm.     Heart sounds: Normal heart sounds. No murmur heard.   No gallop.  Pulmonary:     Effort: Pulmonary effort is normal. No respiratory distress.     Breath sounds: Normal breath sounds. No stridor. No wheezing, rhonchi or rales.  Abdominal:     General: Bowel sounds are normal. There is no distension.     Palpations: Abdomen is soft. There is no mass.     Tenderness: There is no abdominal tenderness. There is no guarding or rebound.     Hernia: No hernia is present.  Skin:    General: Skin is warm and dry.  Neurological:     Mental Status: He is alert and oriented to person, place, and time.   Previous colonoscopy reports reviewed.  Pathology reports reviewed Assessment  History of colon polyps Plan  Patient is scheduled for a colonoscopy on 04/19/2021.  The risks and benefits of the procedure including bleeding and perforation were fully explained to the patient, who gave informed consent.  He was instructed to stop his aspirin 1 week prior to the procedure.  Sutabs have been  prescribed for bowel preparation.

## 2021-05-17 ENCOUNTER — Ambulatory Visit (HOSPITAL_COMMUNITY)
Admission: RE | Admit: 2021-05-17 | Discharge: 2021-05-17 | Disposition: A | Discharge status: Home / Self Care | Payer: Medicare HMO | Attending: General Surgery | Admitting: General Surgery

## 2021-05-17 ENCOUNTER — Other Ambulatory Visit: Payer: Self-pay

## 2021-05-17 ENCOUNTER — Encounter (HOSPITAL_COMMUNITY)
Admission: RE | Disposition: A | Discharge status: Home / Self Care | Payer: Self-pay | Source: Home / Self Care | Attending: General Surgery

## 2021-05-17 ENCOUNTER — Encounter (HOSPITAL_COMMUNITY): Payer: Self-pay | Admitting: General Surgery

## 2021-05-17 DIAGNOSIS — Z7982 Long term (current) use of aspirin: Secondary | ICD-10-CM | POA: Diagnosis not present

## 2021-05-17 DIAGNOSIS — K635 Polyp of colon: Secondary | ICD-10-CM | POA: Diagnosis not present

## 2021-05-17 DIAGNOSIS — Z79899 Other long term (current) drug therapy: Secondary | ICD-10-CM | POA: Diagnosis not present

## 2021-05-17 DIAGNOSIS — Z1211 Encounter for screening for malignant neoplasm of colon: Secondary | ICD-10-CM | POA: Insufficient documentation

## 2021-05-17 DIAGNOSIS — Z860100 Personal history of colon polyps, unspecified: Secondary | ICD-10-CM

## 2021-05-17 DIAGNOSIS — D120 Benign neoplasm of cecum: Secondary | ICD-10-CM | POA: Diagnosis not present

## 2021-05-17 DIAGNOSIS — K5730 Diverticulosis of large intestine without perforation or abscess without bleeding: Secondary | ICD-10-CM | POA: Diagnosis not present

## 2021-05-17 DIAGNOSIS — Z791 Long term (current) use of non-steroidal anti-inflammatories (NSAID): Secondary | ICD-10-CM | POA: Insufficient documentation

## 2021-05-17 DIAGNOSIS — Z87891 Personal history of nicotine dependence: Secondary | ICD-10-CM | POA: Insufficient documentation

## 2021-05-17 DIAGNOSIS — K649 Unspecified hemorrhoids: Secondary | ICD-10-CM | POA: Insufficient documentation

## 2021-05-17 DIAGNOSIS — Z8249 Family history of ischemic heart disease and other diseases of the circulatory system: Secondary | ICD-10-CM | POA: Insufficient documentation

## 2021-05-17 DIAGNOSIS — Z809 Family history of malignant neoplasm, unspecified: Secondary | ICD-10-CM | POA: Diagnosis not present

## 2021-05-17 DIAGNOSIS — Z91013 Allergy to seafood: Secondary | ICD-10-CM | POA: Insufficient documentation

## 2021-05-17 DIAGNOSIS — K621 Rectal polyp: Secondary | ICD-10-CM | POA: Diagnosis not present

## 2021-05-17 HISTORY — PX: POLYPECTOMY: SHX5525

## 2021-05-17 HISTORY — PX: COLONOSCOPY: SHX5424

## 2021-05-17 SURGERY — COLONOSCOPY
Anesthesia: Moderate Sedation

## 2021-05-17 MED ORDER — MIDAZOLAM HCL 5 MG/5ML IJ SOLN
INTRAMUSCULAR | Status: DC | PRN
  Administered 2021-05-17: 3 mg via INTRAVENOUS

## 2021-05-17 MED ORDER — SODIUM CHLORIDE 0.9 % IV SOLN: INTRAVENOUS | Status: DC

## 2021-05-17 MED ORDER — STERILE WATER FOR IRRIGATION IR SOLN
Status: DC | PRN
  Administered 2021-05-17: 100 mL

## 2021-05-17 MED ORDER — MIDAZOLAM HCL 5 MG/5ML IJ SOLN
INTRAMUSCULAR | Status: AC
  Filled 2021-05-17: qty 10

## 2021-05-17 MED ORDER — MEPERIDINE HCL 50 MG/ML IJ SOLN
INTRAMUSCULAR | Status: AC
  Filled 2021-05-17: qty 1

## 2021-05-17 MED ORDER — MEPERIDINE HCL 50 MG/ML IJ SOLN
INTRAMUSCULAR | Status: DC | PRN
  Administered 2021-05-17: 50 mg via INTRAVENOUS

## 2021-05-17 NOTE — Interval H&P Note (Signed)
History and Physical Interval Note:  05/17/2021 7:27 AM  Jerry Boyer  has presented today for surgery, with the diagnosis of History of colon polyps.  The various methods of treatment have been discussed with the patient and family. After consideration of risks, benefits and other options for treatment, the patient has consented to  Procedure(s): COLONOSCOPY (N/A) as a surgical intervention.  The patient's history has been reviewed, patient examined, no change in status, stable for surgery.  I have reviewed the patient's chart and labs.  Questions were answered to the patient's satisfaction.     Aviva Signs

## 2021-05-17 NOTE — Op Note (Signed)
[facility] Patient Name: Jerry Boyer Procedure Date: 05/17/2021 7:01 AM MRN: [id number] Date of Birth: [birth year] Attending MD: Aviva Signs , MD CSN: [id number] Age: 75 Admit Type: Outpatient Procedure:                Colonoscopy Indications:              Follow-up for history of colon polyps Providers:                Aviva Signs, MD, Rosina Lowenstein, RN, Aram Candela Referring MD:              Medicines:                Midazolam 3 mg IV, Meperidine 50 mg IV Complications:            No immediate complications. Estimated Blood Loss:     Estimated blood loss: none. Procedure:                Pre-Anesthesia Assessment:                           - Prior to the procedure, a History and Physical                            was performed, and patient medications and                            allergies were reviewed. The patient is competent.                            The risks and benefits of the procedure and the                            sedation options and risks were discussed with the                            patient. All questions were answered and informed                            consent was obtained. Patient identification and                            proposed procedure were verified by the physician,                            the nurse and the technician in the endoscopy                            suite. Mental Status Examination: alert and                            oriented. Airway Examination: normal oropharyngeal                            airway and neck mobility. Respiratory Examination:  clear to auscultation. CV Examination: RRR, no                            murmurs, no S3 or S4. Prophylactic Antibiotics: The                            patient does not require prophylactic antibiotics.                            Prior Anticoagulants: The patient has taken no                            previous anticoagulant or antiplatelet agents.  ASA                            Grade Assessment: II - A patient with mild systemic                            disease. After reviewing the risks and benefits,                            the patient was deemed in satisfactory condition to                            undergo the procedure. The anesthesia plan was to                            use moderate sedation / analgesia (conscious                            sedation). Immediately prior to administration of                            medications, the patient was re-assessed for                            adequacy to receive sedatives. The heart rate,                            respiratory rate, oxygen saturations, blood                            pressure, adequacy of pulmonary ventilation, and                            response to care were monitored throughout the                            procedure. The physical status of the patient was                            re-assessed after the procedure.  After obtaining informed consent, the colonoscope                            was passed under direct vision. Throughout the                            procedure, the patient's blood pressure, pulse, and                            oxygen saturations were monitored continuously. The                            [id number]) scope was introduced through the                            anus and advanced to the the cecum, identified by                            the appendiceal orifice, ileocecal valve and                            palpation. No anatomical landmarks were                            photographed. The entire colon was well visualized.                            The colonoscopy was performed without difficulty.                            The quality of the bowel preparation was adequate.                            The total duration of the procedure was 21 minutes. Scope In: 7:31:42 AM Scope Out:  7:51:38 AM Scope Withdrawal Time: 0 hours 15 minutes 19 seconds  Total Procedure Duration: 0 hours 19 minutes 56 seconds  Findings:      Hemorrhoids were found on perianal exam.      Scattered medium-mouthed diverticula were found in the entire colon.       Estimated blood loss: none.      A 10 mm polyp was found in the ileocecal valve. The polyp was sessile.       The polyp was removed with a hot snare. Resection and retrieval were       complete. Estimated blood loss: none.      A 3 mm polyp was found in the distal rectum x 2. The polyps were       pedunculated. The polyps was removed with a hot snare. Resection and       retrieval were complete. Estimated blood loss: none.      The exam was otherwise without abnormality on direct and retroflexion       views. Impression:               - Hemorrhoids found on perianal exam.                           -  Diverticulosis in the entire examined colon.                           - One 10 mm polyp at the ileocecal valve, removed                            with a hot snare. Resected and retrieved.                           - One 3 mm polyp in the distal rectum, removed with                            a hot snare. Resected and retrieved.                           - The examination was otherwise normal on direct                            and retroflexion views. Moderate Sedation:      Moderate (conscious) sedation was administered by the endoscopy nurse       and supervised by the endoscopist. The patient's oxygen saturation,       heart rate, blood pressure and response to care were monitored. Recommendation:           - Written discharge instructions were provided to                            the patient.                           - The signs and symptoms of potential delayed                            complications were discussed with the patient.                           - Patient has a contact number available for                             emergencies.                           - Return to normal activities tomorrow.                           - Resume previous diet.                           - Continue present medications.                           - Repeat colonoscopy is recommended [Repeat                            reason]. The colonoscopy date will be determined  after pathology results from today's exam become                            available for review. Procedure Code(s):        --- Professional ---                           [phone], Colonoscopy, flexible; with removal of                            tumor(s), polyp(s), or other lesion(s) by snare                            technique Diagnosis Code(s):        --- Professional ---                           K63.5, Polyp of colon                           K62.1, Rectal polyp                           Z86.010, Personal history of colonic polyps                           K57.30, Diverticulosis of large intestine without                            perforation or abscess without bleeding                           K64.9, Unspecified hemorrhoids CPT copyright 2019 American Medical Association. All rights reserved. The codes documented in this report are preliminary and upon coder review may  be revised to meet current compliance requirements. Aviva Signs, MD Aviva Signs, MD 05/17/2021 7:59:55 AM This report has been signed electronically. Number of Addenda: 0

## 2021-05-18 LAB — SURGICAL PATHOLOGY

## 2021-05-19 ENCOUNTER — Encounter (HOSPITAL_COMMUNITY): Payer: Self-pay | Admitting: General Surgery

## 2021-05-24 ENCOUNTER — Telehealth (INDEPENDENT_AMBULATORY_CARE_PROVIDER_SITE_OTHER): Payer: Medicare HMO | Admitting: General Surgery

## 2021-05-24 DIAGNOSIS — Z09 Encounter for follow-up examination after completed treatment for conditions other than malignant neoplasm: Secondary | ICD-10-CM

## 2021-05-24 NOTE — Telephone Encounter (Signed)
Patient returned my call from earlier today.  Pathology results reveal a tubular adenoma in the cecum.  No evidence of dysplasia or malignancy was seen.  I told the patient that a follow-up colonoscopy in 5 years is recommended.  He understands.  He will call my office in 5 years for follow-up colonoscopy.  Total telephone time was 1 minute.

## 2021-06-27 DIAGNOSIS — H26491 Other secondary cataract, right eye: Secondary | ICD-10-CM | POA: Diagnosis not present

## 2021-06-27 DIAGNOSIS — H35341 Macular cyst, hole, or pseudohole, right eye: Secondary | ICD-10-CM | POA: Diagnosis not present

## 2021-06-27 DIAGNOSIS — H31092 Other chorioretinal scars, left eye: Secondary | ICD-10-CM | POA: Diagnosis not present

## 2021-06-27 DIAGNOSIS — H43811 Vitreous degeneration, right eye: Secondary | ICD-10-CM | POA: Diagnosis not present

## 2021-07-11 ENCOUNTER — Ambulatory Visit: Payer: Medicare HMO | Admitting: Gastroenterology

## 2021-08-03 DIAGNOSIS — H33321 Round hole, right eye: Secondary | ICD-10-CM | POA: Diagnosis not present

## 2021-08-03 DIAGNOSIS — H26491 Other secondary cataract, right eye: Secondary | ICD-10-CM | POA: Diagnosis not present

## 2021-08-03 DIAGNOSIS — H35341 Macular cyst, hole, or pseudohole, right eye: Secondary | ICD-10-CM | POA: Diagnosis not present

## 2021-09-01 DIAGNOSIS — H35341 Macular cyst, hole, or pseudohole, right eye: Secondary | ICD-10-CM | POA: Diagnosis not present

## 2021-09-06 DIAGNOSIS — H52229 Regular astigmatism, unspecified eye: Secondary | ICD-10-CM | POA: Diagnosis not present

## 2021-12-01 DIAGNOSIS — H31093 Other chorioretinal scars, bilateral: Secondary | ICD-10-CM | POA: Diagnosis not present

## 2021-12-01 DIAGNOSIS — H35032 Hypertensive retinopathy, left eye: Secondary | ICD-10-CM | POA: Diagnosis not present

## 2021-12-21 ENCOUNTER — Ambulatory Visit
Admission: RE | Admit: 2021-12-21 | Discharge: 2021-12-21 | Disposition: A | Discharge status: Home / Self Care | Payer: Medicare HMO | Source: Ambulatory Visit | Attending: Family Medicine | Admitting: Family Medicine

## 2021-12-21 VITALS — BP 111/67 | HR 68 | Temp 97.3°F | Resp 18

## 2021-12-21 DIAGNOSIS — N390 Urinary tract infection, site not specified: Secondary | ICD-10-CM

## 2021-12-21 LAB — POCT URINALYSIS DIP (MANUAL ENTRY)
Bilirubin, UA: NEGATIVE
Glucose, UA: NEGATIVE mg/dL
Ketones, POC UA: NEGATIVE mg/dL
Nitrite, UA: NEGATIVE
Spec Grav, UA: 1.025 (ref 1.010–1.025)
Urobilinogen, UA: 0.2 E.U./dL
pH, UA: 6 (ref 5.0–8.0)

## 2021-12-21 MED ORDER — CIPROFLOXACIN HCL 500 MG PO TABS: 500.0000 mg | ORAL_TABLET | Freq: Two times a day (BID) | ORAL | 0 refills | Status: DC

## 2021-12-21 NOTE — ED Provider Notes (Signed)
[facility]    CSN: [id number] Arrival date & time: 12/21/21  8916      History   Chief Complaint Chief Complaint  Patient presents with   Urinary Frequency    kidneys hurt - Entered by patient    HPI Jerry Boyer is a 76 y.o. male.   Presenting today with 2-week history of urgency with urination, mid back pain bilaterally, diarrhea.  Denies fever, chills, nausea, vomiting, hematuria.  Not trying anything over-the-counter for symptoms.  History of urinary tract infections.   Past Medical History:  Diagnosis Date   Anxiety    Arthritis    BPH (benign prostatic hyperplasia)    Elevated troponin 01/30/2018   a. 01/2018: Type 2 NSTEMI most consistent with demand ischemia in the setting of Urosepsis.    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   Spondylolisthesis of lumbar region 06/03/2018   Hemorrhage of colon following colonoscopy    Blood in stool 03/25/2018   Rectal bleeding    Special screening for malignant neoplasms, colon    Benign neoplasm of ascending colon    Benign neoplasm of descending colon    Rectal polyp    NSTEMI (non-ST elevated myocardial infarction) ([place]) 01/30/2018   Sepsis ([place]) 01/29/2018   Acute lower UTI 01/29/2018   HLD (hyperlipidemia) 01/29/2018   HTN (hypertension) 01/29/2018   Elevated troponin 01/29/2018   Non-recurrent unilateral inguinal hernia without obstruction or gangrene    Umbilical hernia without obstruction and without gangrene     Past Surgical History:  Procedure Laterality Date   BACK SURGERY     lumbar disc   CATARACT EXTRACTION W/PHACO Right 02/05/2017   Procedure: CATARACT EXTRACTION PHACO AND INTRAOCULAR LENS  PLACEMENT ([place]);  Surgeon: Tonny Branch, MD;  Location: [facility];  Service: Ophthalmology;  Laterality: Right;  CDE: 7.36   CATARACT EXTRACTION W/PHACO Left 02/19/2017   Procedure: CATARACT EXTRACTION PHACO AND INTRAOCULAR LENS PLACEMENT LEFT EYE;  Surgeon: Tonny Branch, MD;  Location: [facility];  Service: Ophthalmology;  Laterality: Left;  CDE: 7.98   COLONOSCOPY     COLONOSCOPY N/A 03/19/2018   Procedure: COLONOSCOPY;  Surgeon: Aviva Signs, MD;  [facility];  Service: Gastroenterology;  Laterality: N/A;   COLONOSCOPY N/A 03/26/2018   Procedure: COLONOSCOPY;  Surgeon: Aviva Signs, MD;  [facility];  Service: Gastroenterology;  Laterality: N/A;   COLONOSCOPY N/A 05/17/2021   Procedure: COLONOSCOPY;  Surgeon: Aviva Signs, MD;  [facility];  Service: Gastroenterology;  Laterality: N/A;   INGUINAL HERNIA REPAIR Right 12/14/2017   Procedure: HERNIA REPAIR INGUINAL ADULT WITH MESH;  Surgeon: Aviva Signs, MD;  Location: [facility];  Service: General;  Laterality: Right;   JOINT REPLACEMENT Left    POLYPECTOMY  03/19/2018   Procedure: POLYPECTOMY;  Surgeon: Aviva Signs, MD;  [facility];  Service: Gastroenterology;;   POLYPECTOMY  05/17/2021   Procedure: POLYPECTOMY;  Surgeon: Aviva Signs, MD;  [facility];  Service: Gastroenterology;;   UMBILICAL HERNIA REPAIR N/A 12/14/2017   Procedure: HERNIA REPAIR UMBILICAL ADULT WITH MESH;  Surgeon: Aviva Signs, MD;  Location: [facility];  Service: General;  Laterality: N/A;  Home Medications    Prior to Admission medications   Medication Sig Start Date End Date Taking? Authorizing Provider  ciprofloxacin (CIPRO) 500 MG tablet Take 1 tablet (500 mg total) by mouth 2 (two) times daily. 12/21/21  Yes Volney American, PA-C  acetaminophen (TYLENOL) 500 MG tablet Take 500 mg by mouth every 6 (six) hours as needed for mild pain.    [provider]  atorvastatin (LIPITOR) 40 MG tablet Take  40 mg by mouth every morning.     [provider]  b complex vitamins tablet Take 1 tablet by mouth daily.    [provider]  Bilberry 150 MG CAPS Take 150 mg by mouth daily.    [provider]  citalopram (CELEXA) 40 MG tablet Take 40 mg by mouth daily. 11/20/17   [provider]  Coenzyme Q10 (COQ10) 100 MG CAPS Take 100 mg by mouth daily.    [provider]  doxazosin (CARDURA) 4 MG tablet Take 4 mg by mouth at bedtime. 11/20/17   [provider]  ezetimibe (ZETIA) 10 MG tablet Take 10 mg by mouth daily. 11/29/17   [provider]  lisinopril (PRINIVIL,ZESTRIL) 20 MG tablet Take 20 mg by mouth daily.    [provider]  Multiple Vitamins-Minerals (CENTRUM SILVER ADULT 50+ PO) Take 1 tablet by mouth daily.    [provider]  Omega-3 Fatty Acids (OMEGA-3 FISH OIL PO) Take 1 capsule by mouth daily.    [provider]  SUTAB [id number] MG TABS Take 1 tablet by mouth daily. 04/07/21   [provider]  tamsulosin (FLOMAX) 0.4 MG CAPS capsule TAKE 1 CAPSULE BY MOUTH EVERY DAY AT BEDTIME 11/18/18   [provider]  vitamin C (ASCORBIC ACID) 500 MG tablet Take 500 mg by mouth daily.    [provider]    Family History Family History  Problem Relation Age of Onset   Hypertension Mother    Cancer Father    Colon cancer Neg Hx     Social History Social History   Tobacco Use   Smoking status: Former    Packs/day: 1.00    Years: 20.00    Pack years: 20.00    Types: Cigarettes    Quit date: 01/29/2001    Years since quitting: 20.9   Smokeless tobacco: Never  Vaping Use   Vaping Use: Never used  Substance Use Topics   Alcohol use: Yes    Alcohol/week: 21.0 standard drinks    Types: 21 Cans of beer per week    Comment: states "drinks beer about every day"   Drug use: No     Allergies   Shrimp [shellfish allergy]   Review of Systems Review of Systems Per  HPI  Physical Exam Triage Vital Signs ED Triage Vitals [12/21/21 1011]  Enc Vitals Group     BP 111/67     Pulse Rate 68     Resp 18     Temp (!) 97.3 F (36.3 C)     Temp Source Oral     SpO2 94 %     Weight      Height      Head Circumference      Peak Flow      Pain Score      Pain Loc      Pain Edu?      Excl. in [place]?    No data found.  Updated Vital Signs BP 111/67 (BP Location: Right  Arm)   Pulse 68   Temp (!) 97.3 F (36.3 C) (Oral)   Resp 18   SpO2 94%   Visual Acuity Right Eye Distance:   Left Eye Distance:   Bilateral Distance:    Right Eye Near:   Left Eye Near:    Bilateral Near:     Physical Exam Vitals and nursing note reviewed.  Constitutional:      Appearance: Normal appearance.  HENT:     Head: Atraumatic.     Mouth/Throat:     Mouth: Mucous membranes are moist.     Pharynx: Oropharynx is clear.  Eyes:     Extraocular Movements: Extraocular movements intact.     Conjunctiva/sclera: Conjunctivae normal.  Cardiovascular:     Rate and Rhythm: Normal rate and regular rhythm.  Pulmonary:     Effort: Pulmonary effort is normal.     Breath sounds: Normal breath sounds. No wheezing or rales.  Abdominal:     General: Bowel sounds are normal. There is no distension.     Palpations: Abdomen is soft.     Tenderness: There is no abdominal tenderness. There is no right CVA tenderness, left CVA tenderness or guarding.  Musculoskeletal:        General: Normal range of motion.     Cervical back: Normal range of motion and neck supple.  Skin:    General: Skin is warm and dry.  Neurological:     General: No focal deficit present.     Mental Status: He is oriented to person, place, and time.  Psychiatric:        Mood and Affect: Mood normal.        Thought Content: Thought content normal.        Judgment: Judgment normal.     UC Treatments / Results  Labs (all labs ordered are listed, but only abnormal results are displayed) Labs Reviewed   POCT URINALYSIS DIP (MANUAL ENTRY) - Abnormal; Notable for the following components:      Result Value   Blood, UA moderate (*)    Protein Ur, POC trace (*)    Leukocytes, UA Moderate (2+) (*)    All other components within normal limits  URINE CULTURE    EKG   Radiology No results found.  Procedures Procedures (including critical care time)  Medications Ordered in UC Medications - No data to display  Initial Impression / Assessment and Plan / UC Course  I have reviewed the triage vital signs and the nursing notes.  Pertinent labs & imaging results that were available during my care of the patient were reviewed by me and considered in my medical decision making (see chart for details).     Urine culture pending, urinary testing today showing likely urinary tract infection.  Treat with Cipro, fluids, close PCP follow-up.  Return for any acutely worsening symptoms.  Final Clinical Impressions(s) / UC Diagnoses   Final diagnoses:  Acute lower UTI   Discharge Instructions   None    ED Prescriptions     Medication Sig Dispense Auth. Provider   ciprofloxacin (CIPRO) 500 MG tablet Take 1 tablet (500 mg total) by mouth 2 (two) times daily. 14 tablet Volney American, [place]      PDMP not reviewed this encounter.   Volney American, [place] 12/21/21 1103

## 2021-12-21 NOTE — ED Triage Notes (Signed)
Pt states he has had some urgency when urinating for about 2 weeks  Pt states his mid back is in pain near his kidneys  Pt states that this is messing with his bowels and he thinks its his kidneys

## 2021-12-22 LAB — URINE CULTURE: Culture: NO GROWTH

## 2021-12-28 DIAGNOSIS — R7309 Other abnormal glucose: Secondary | ICD-10-CM | POA: Diagnosis not present

## 2021-12-28 DIAGNOSIS — E782 Mixed hyperlipidemia: Secondary | ICD-10-CM | POA: Diagnosis not present

## 2021-12-28 DIAGNOSIS — N400 Enlarged prostate without lower urinary tract symptoms: Secondary | ICD-10-CM | POA: Diagnosis not present

## 2021-12-28 DIAGNOSIS — E559 Vitamin D deficiency, unspecified: Secondary | ICD-10-CM | POA: Diagnosis not present

## 2021-12-28 DIAGNOSIS — Z1331 Encounter for screening for depression: Secondary | ICD-10-CM | POA: Diagnosis not present

## 2021-12-28 DIAGNOSIS — N411 Chronic prostatitis: Secondary | ICD-10-CM | POA: Diagnosis not present

## 2021-12-28 DIAGNOSIS — Z0001 Encounter for general adult medical examination with abnormal findings: Secondary | ICD-10-CM | POA: Diagnosis not present

## 2021-12-28 DIAGNOSIS — Z6838 Body mass index (BMI) 38.0-38.9, adult: Secondary | ICD-10-CM | POA: Diagnosis not present

## 2021-12-28 DIAGNOSIS — I10 Essential (primary) hypertension: Secondary | ICD-10-CM | POA: Diagnosis not present

## 2021-12-28 DIAGNOSIS — N401 Enlarged prostate with lower urinary tract symptoms: Secondary | ICD-10-CM | POA: Diagnosis not present

## 2021-12-28 DIAGNOSIS — E7849 Other hyperlipidemia: Secondary | ICD-10-CM | POA: Diagnosis not present

## 2022-07-11 ENCOUNTER — Ambulatory Visit (INDEPENDENT_AMBULATORY_CARE_PROVIDER_SITE_OTHER): Payer: Medicare HMO | Admitting: Orthopaedic Surgery

## 2022-07-11 ENCOUNTER — Ambulatory Visit (INDEPENDENT_AMBULATORY_CARE_PROVIDER_SITE_OTHER): Payer: Medicare HMO

## 2022-07-11 ENCOUNTER — Encounter: Payer: Self-pay | Admitting: Orthopaedic Surgery

## 2022-07-11 VITALS — BP 126/60 | HR 68 | Ht 74.0 in | Wt 275.0 lb

## 2022-07-11 DIAGNOSIS — G8929 Other chronic pain: Secondary | ICD-10-CM | POA: Diagnosis not present

## 2022-07-11 DIAGNOSIS — M12571 Traumatic arthropathy, right ankle and foot: Secondary | ICD-10-CM

## 2022-07-11 DIAGNOSIS — M25571 Pain in right ankle and joints of right foot: Secondary | ICD-10-CM | POA: Diagnosis not present

## 2022-07-11 MED ORDER — HYDROCODONE-ACETAMINOPHEN 5-325 MG PO TABS: ORAL_TABLET | ORAL | 0 refills | Status: DC

## 2022-07-11 NOTE — Patient Instructions (Addendum)
 Dr.Keeling is here all day on Tuesdays, Wednesday mornings, and Thursday mornings. If you need anything such as a medication refill, please either call BEFORE the end of the day on [facility] or send a message through [place]. Your pharmacy can send a refill request for you. Calling by the end of the day on [facility] allows [place] time to send Dr.Keeling the request and for him to respond before he leaves on Thursdays.  If Dr. Luna Glasgow is out of the office, we may send it to one of the other providers and they may not refill it for the same amount that your original prescription is for.   My name is  and I assist [place]. If you need anything before your next appointment, please do not hesitate to call the office at [phone] and ask to leave a message for me. I will respond within 24-48 business hours.   Dr. Everitt Amber [facility] [place] PHONE: [phone]

## 2022-07-11 NOTE — Progress Notes (Signed)
Subjective:    Patient ID: Jerry Boyer, male    DOB: [birth year], 76 y.o.   MRN: [id number]  HPI He has had problems with the right ankle for years secondary to trauma.  He was seen by Dr. Sharol Given in [facility] 06-10-2019 and surgery recommended for fusion of the ankle.  It was planned but the patient got COVID and it was postponed.  He had injection of the ankle in 2021 by Dr. Junius Roads.  He has put up with the pain and deformity but now says it is time to do something.  He has no new trauma.  He has swelling and pain and limping.   Review of Systems  Constitutional:  Positive for activity change.  Musculoskeletal:  Positive for arthralgias, back pain, gait problem, joint swelling and myalgias.  All other systems reviewed and are negative. For Review of Systems, all other systems reviewed and are negative.  The following is a summary of the past history medically, past history surgically, known current medicines, social history and family history.  This information is gathered electronically by the computer from prior information and documentation.  I review this each visit and have found including this information at this point in the chart is beneficial and informative.   Past Medical History:  Diagnosis Date   Anxiety    Arthritis    BPH (benign prostatic hyperplasia)    Elevated troponin 01/30/2018   a. 01/2018: Type 2 NSTEMI most consistent with demand ischemia in the setting of Urosepsis.    Hypercholesteremia    Hypertension     Past Surgical History:  Procedure Laterality Date   BACK SURGERY     lumbar disc   CATARACT EXTRACTION W/PHACO Right 02/05/2017   Procedure: CATARACT EXTRACTION PHACO AND INTRAOCULAR LENS PLACEMENT (IOC);  Surgeon: Tonny Branch, MD;  Location: [facility];  Service: Ophthalmology;  Laterality: Right;  CDE: 7.36   CATARACT EXTRACTION W/PHACO Left 02/19/2017   Procedure: CATARACT EXTRACTION PHACO AND INTRAOCULAR LENS PLACEMENT LEFT EYE;  Surgeon: Tonny Branch, MD;   Location: [facility];  Service: Ophthalmology;  Laterality: Left;  CDE: 7.98   COLONOSCOPY     COLONOSCOPY N/A 03/19/2018   Procedure: COLONOSCOPY;  Surgeon: Aviva Signs, MD;  Location: [facility];  Service: Gastroenterology;  Laterality: N/A;   COLONOSCOPY N/A 03/26/2018   Procedure: COLONOSCOPY;  Surgeon: Aviva Signs, MD;  [facility];  Service: Gastroenterology;  Laterality: N/A;   COLONOSCOPY N/A 05/17/2021   Procedure: COLONOSCOPY;  Surgeon: Aviva Signs, MD;  [facility];  Service: Gastroenterology;  Laterality: N/A;   INGUINAL HERNIA REPAIR Right 12/14/2017   Procedure: HERNIA REPAIR INGUINAL ADULT WITH MESH;  Surgeon: Aviva Signs, MD;  Location: [facility];  Service: General;  Laterality: Right;   JOINT REPLACEMENT Left    POLYPECTOMY  03/19/2018   Procedure: POLYPECTOMY;  Surgeon: Aviva Signs, MD;  [facility]: [facility];  Service: Gastroenterology;;   POLYPECTOMY  05/17/2021   Procedure: POLYPECTOMY;  Surgeon: Aviva Signs, MD;  [facility];  Service: Gastroenterology;;   UMBILICAL HERNIA REPAIR N/A 12/14/2017   Procedure: HERNIA REPAIR UMBILICAL ADULT WITH MESH;  Surgeon: Aviva Signs, MD;  Location: [facility];  Service: General;  Laterality: N/A;    Current Outpatient Medications on File Prior to Visit  Medication Sig Dispense Refill   acetaminophen (TYLENOL) 500 MG tablet Take 500 mg by mouth every 6 (six) hours as needed for mild pain.     atorvastatin (LIPITOR) 40  MG tablet Take 40 mg by mouth every morning.      b complex vitamins tablet Take 1 tablet by mouth daily.     Bilberry 150 MG CAPS Take 150 mg by mouth daily.     ciprofloxacin (CIPRO) 500 MG tablet Take 1 tablet (500 mg total) by mouth 2 (two) times daily. 14 tablet 0   citalopram (CELEXA) 40 MG tablet Take 40 mg by mouth daily.     Coenzyme Q10 (COQ10) 100 MG CAPS Take 100 mg by mouth daily.     doxazosin (CARDURA) 4 MG tablet Take 4 mg by mouth at bedtime.     ezetimibe  (ZETIA) 10 MG tablet Take 10 mg by mouth daily.     lisinopril (PRINIVIL,ZESTRIL) 20 MG tablet Take 20 mg by mouth daily.     Multiple Vitamins-Minerals (CENTRUM SILVER ADULT 50+ PO) Take 1 tablet by mouth daily.     Omega-3 Fatty Acids (OMEGA-3 FISH OIL PO) Take 1 capsule by mouth daily.     SUTAB [phone] MG TABS Take 1 tablet by mouth daily.     tamsulosin (FLOMAX) 0.4 MG CAPS capsule TAKE 1 CAPSULE BY MOUTH EVERY DAY AT BEDTIME     vitamin C (ASCORBIC ACID) 500 MG tablet Take 500 mg by mouth daily.     No current facility-administered medications on file prior to visit.    Social History   Socioeconomic History   Marital status: Single    Spouse name: Not on file   Number of children: Not on file   Years of education: Not on file   Highest education level: Not on file  Occupational History   Not on file  Tobacco Use   Smoking status: Former    Packs/day: 1.00    Years: 20.00    Total pack years: 20.00    Types: Cigarettes    Quit date: 01/29/2001    Years since quitting: 21.4   Smokeless tobacco: Never  Vaping Use   Vaping Use: Never used  Substance and Sexual Activity   Alcohol use: Yes    Alcohol/week: 21.0 standard drinks of alcohol    Types: 21 Cans of beer per week    Comment: states "drinks beer about every day"   Drug use: No   Sexual activity: Yes    Birth control/protection: None  Other Topics Concern   Not on file  Social History Narrative   Not on file   Social Determinants of Health   Financial Resource Strain: Not on file  Food Insecurity: Not on file  Transportation Needs: Not on file  Physical Activity: Not on file  Stress: Not on file  Social Connections: Not on file  Intimate Partner Violence: Not on file    Family History  Problem Relation Age of Onset   Hypertension Mother    Cancer Father    Colon cancer Neg Hx     BP 126/60   Pulse 68   Ht '6\' 2"'$  (1.88 m)   Wt 275 lb (124.7 kg)   BMI 35.31 kg/m   Body mass index is 35.31  kg/m.      Objective:   Physical Exam Vitals and nursing note reviewed. Exam conducted with a chaperone present.  Constitutional:      Appearance: He is well-developed.  HENT:     Head: Normocephalic and atraumatic.  Eyes:     Conjunctiva/sclera: Conjunctivae normal.     Pupils: Pupils are equal, round, and reactive to light.  Cardiovascular:  Rate and Rhythm: Normal rate and regular rhythm.  Pulmonary:     Effort: Pulmonary effort is normal.  Abdominal:     Palpations: Abdomen is soft.  Musculoskeletal:     Cervical back: Normal range of motion and neck supple.       Feet:  Skin:    General: Skin is warm and dry.  Neurological:     Mental Status: He is alert and oriented to person, place, and time.     Cranial Nerves: No cranial nerve deficit.     Motor: No abnormal muscle tone.     Coordination: Coordination normal.     Deep Tendon Reflexes: Reflexes are normal and symmetric. Reflexes normal.  Psychiatric:        Behavior: Behavior normal.        Thought Content: Thought content normal.        Judgment: Judgment normal.      X-rays were done of the right ankle, reported separately.     Assessment & Plan:   Encounter Diagnoses  Name Primary?   Chronic pain of right ankle Yes   Traumatic arthritis of right ankle    I will have him see Dr. Sharol Given again for scheduling for arthrodesis of the right ankle.  I will give pain medicine.  I have reviewed the [place] web site prior to prescribing narcotic medicine for this patient.  Call if any problem.  Precautions discussed.  Electronically Signed Sanjuana Kava, MD 12/12/20239:08 AM

## 2022-07-18 ENCOUNTER — Ambulatory Visit
Admission: EM | Admit: 2022-07-18 | Discharge: 2022-07-18 | Disposition: A | Discharge status: Home / Self Care | Payer: Medicare HMO | Attending: Nurse Practitioner | Admitting: Nurse Practitioner

## 2022-07-18 ENCOUNTER — Encounter: Payer: Self-pay | Admitting: Emergency Medicine

## 2022-07-18 DIAGNOSIS — H6122 Impacted cerumen, left ear: Secondary | ICD-10-CM | POA: Insufficient documentation

## 2022-07-18 DIAGNOSIS — H6991 Unspecified Eustachian tube disorder, right ear: Secondary | ICD-10-CM | POA: Diagnosis not present

## 2022-07-18 DIAGNOSIS — J069 Acute upper respiratory infection, unspecified: Secondary | ICD-10-CM | POA: Diagnosis not present

## 2022-07-18 DIAGNOSIS — Z1152 Encounter for screening for COVID-19: Secondary | ICD-10-CM | POA: Diagnosis not present

## 2022-07-18 LAB — RESP PANEL BY RT-PCR (FLU A&B, COVID) ARPGX2
Influenza A by PCR: NEGATIVE
Influenza B by PCR: NEGATIVE
SARS Coronavirus 2 by RT PCR: NEGATIVE

## 2022-07-18 MED ORDER — FLUTICASONE PROPIONATE 50 MCG/ACT NA SUSP: 1.0000 | Freq: Every day | NASAL | 0 refills | Status: DC

## 2022-07-18 MED ORDER — BENZONATATE 100 MG PO CAPS: 100.0000 mg | ORAL_CAPSULE | Freq: Three times a day (TID) | ORAL | 0 refills | Status: DC | PRN

## 2022-07-18 MED ORDER — CARBAMIDE PEROXIDE 6.5 % OT SOLN: 5.0000 [drp] | Freq: Two times a day (BID) | OTIC | 0 refills | Status: DC

## 2022-07-18 MED ORDER — ALBUTEROL SULFATE (2.5 MG/3ML) 0.083% IN NEBU
2.5000 mg | INHALATION_SOLUTION | Freq: Once | RESPIRATORY_TRACT | Status: AC
Start: 2022-07-18 — End: 2022-07-18
  Administered 2022-07-18: 2.5 mg via RESPIRATORY_TRACT

## 2022-07-18 NOTE — Discharge Instructions (Addendum)
You have a viral upper respiratory infection.  Symptoms should improve over the next week to 10 days.  If you develop chest pain or shortness of breath, go to the emergency room.  We have tested you today for COVID-19 and influenza.  You will see the results in Mychart and we will call you with positive results.    Please stay home and isolate until you are aware of the results.    We have given you a breathing treatment today which seemed to help open up your airway.  The oxygen level increased from 92% to 98%.  Some things that can make you feel better are: - Increased rest - Increasing fluid with water/sugar free electrolytes - Acetaminophen and ibuprofen as needed for fever/pain - Salt water gargling, chloraseptic spray and throat lozenges for sore throat - OTC guaifenesin (Mucinex) 600 mg twice daily for congestion - Saline sinus flushes or a neti pot - Humidifying the air -Tessalon Perles every 8 hours as needed for dry cough  Start the Debrox drops for the left ear to help with the ear wax  Start the Flonase nasal spray to help with fluid behind the right ear.

## 2022-07-18 NOTE — ED Provider Notes (Signed)
[facility]    CSN: [id number] Arrival date & time: 07/18/22  1417      History   Chief Complaint No chief complaint on file.   HPI Jerry Boyer is a 76 y.o. male.   Patient presents today for 2 to 3 days of dry and productive cough, pain in his chest after coughing, nasal congestion, postnasal drainage, sneezing and sore throat, headache, bilateral ear pain, decreased appetite, loss of taste, and fatigue.  He denies shortness of breath or wheezing, chest congestion, abdominal pain, nausea/vomiting, diarrhea.  Patient reports history of tonsillectomy and adenoidectomy as a child.  Has not taken anything for symptoms so far.  Reports "I was hoping for a shot because I am going out of town and need to feel better soon."    Patient reports he quit smoking 20 years ago.  Reports prior to that, he smoked for 20 pack years.    Past Medical History:  Diagnosis Date   Anxiety    Arthritis    BPH (benign prostatic hyperplasia)    Elevated troponin 01/30/2018   a. 01/2018: Type 2 NSTEMI most consistent with demand ischemia in the setting of Urosepsis.    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   Spondylolisthesis of lumbar region 06/03/2018   Hemorrhage of colon following colonoscopy    Blood in stool 03/25/2018   Rectal bleeding    Special screening for malignant neoplasms, colon    Benign neoplasm of ascending colon    Benign neoplasm of descending colon    Rectal polyp    NSTEMI (non-ST elevated myocardial infarction) ([place]) 01/30/2018   Sepsis ([place]) 01/29/2018   Acute lower UTI 01/29/2018   HLD (hyperlipidemia) 01/29/2018   HTN (hypertension) 01/29/2018   Elevated  troponin 01/29/2018   Non-recurrent unilateral inguinal hernia without obstruction or gangrene    Umbilical hernia without obstruction and without gangrene     Past Surgical History:  Procedure Laterality Date   BACK SURGERY     lumbar disc   CATARACT EXTRACTION W/PHACO Right 02/05/2017   Procedure: CATARACT EXTRACTION PHACO AND INTRAOCULAR LENS PLACEMENT ([place]);  Surgeon: Tonny Branch, MD;  Location: [facility];  Service: Ophthalmology;  Laterality: Right;  CDE: 7.36   CATARACT EXTRACTION W/PHACO Left 02/19/2017   Procedure: CATARACT EXTRACTION PHACO AND INTRAOCULAR LENS PLACEMENT LEFT EYE;  Surgeon: Tonny Branch, MD;  Location: [facility];  Service: Ophthalmology;  Laterality: Left;  CDE: 7.98   COLONOSCOPY     COLONOSCOPY N/A 03/19/2018   Procedure: COLONOSCOPY;  Surgeon: Aviva Signs, MD;  [facility];  Service: Gastroenterology;  Laterality: N/A;   COLONOSCOPY N/A 03/26/2018   Procedure: COLONOSCOPY;  Surgeon: Aviva Signs, MD;  [facility];  Service: Gastroenterology;  Laterality: N/A;   COLONOSCOPY N/A 05/17/2021   Procedure: COLONOSCOPY;  Surgeon: Aviva Signs, MD;  [facility];  Service: Gastroenterology;  Laterality: N/A;   INGUINAL HERNIA REPAIR Right 12/14/2017   Procedure: HERNIA REPAIR INGUINAL ADULT WITH MESH;  Surgeon: Aviva Signs, MD;  Location: [facility];  Service: General;  Laterality: Right;   JOINT REPLACEMENT Left    POLYPECTOMY  03/19/2018   Procedure: POLYPECTOMY;  Surgeon: Arnoldo Morale,  Elta Guadeloupe, MD;  [facility];  Service: Gastroenterology;;   POLYPECTOMY  05/17/2021   Procedure: POLYPECTOMY;  Surgeon: Aviva Signs, MD;  [facility];  Service: Gastroenterology;;   UMBILICAL HERNIA REPAIR N/A 12/14/2017   Procedure: HERNIA REPAIR UMBILICAL ADULT WITH MESH;  Surgeon: Aviva Signs, MD;  Location: [facility];  Service: General;  Laterality: N/A;       Home Medications    Prior to Admission medications   Medication Sig Start  Date End Date Taking? Authorizing Provider  benzonatate (TESSALON) 100 MG capsule Take 1 capsule (100 mg total) by mouth 3 (three) times daily as needed for cough. Do not take with alcohol or while driving or operating heavy machinery.  May cause drowsiness. 07/18/22  Yes Eulogio Bear, NP  carbamide peroxide (DEBROX) 6.5 % OTIC solution Place 5 drops into the left ear 2 (two) times daily. 07/18/22  Yes Eulogio Bear, NP  fluticasone (FLONASE) 50 MCG/ACT nasal spray Place 1 spray into both nostrils daily. 07/18/22  Yes Eulogio Bear, NP  acetaminophen (TYLENOL) 500 MG tablet Take 500 mg by mouth every 6 (six) hours as needed for mild pain.    [provider]  atorvastatin (LIPITOR) 40 MG tablet Take 40 mg by mouth every morning.     [provider]  b complex vitamins tablet Take 1 tablet by mouth daily.    [provider]  Bilberry 150 MG CAPS Take 150 mg by mouth daily.    [provider]  citalopram (CELEXA) 40 MG tablet Take 40 mg by mouth daily. 11/20/17   [provider]  Coenzyme Q10 (COQ10) 100 MG CAPS Take 100 mg by mouth daily.    [provider]  doxazosin (CARDURA) 4 MG tablet Take 4 mg by mouth at bedtime. 11/20/17   [provider]  ezetimibe (ZETIA) 10 MG tablet Take 10 mg by mouth daily. 11/29/17   [provider]  HYDROcodone-acetaminophen (NORCO/VICODIN) 5-325 MG tablet One tablet every four hours as needed for acute pain.  Limit of five days per [place] statue. 07/11/22   Sanjuana Kava, MD  lisinopril (PRINIVIL,ZESTRIL) 20 MG tablet Take 20 mg by mouth daily.    [provider]  Multiple Vitamins-Minerals (CENTRUM SILVER ADULT 50+ PO) Take 1 tablet by mouth daily.    [provider]  Omega-3 Fatty Acids (OMEGA-3 FISH OIL PO) Take 1 capsule by mouth daily.    [provider]  SUTAB [phone] MG TABS Take 1 tablet by mouth daily. 04/07/21   [provider]   tamsulosin (FLOMAX) 0.4 MG CAPS capsule TAKE 1 CAPSULE BY MOUTH EVERY DAY AT BEDTIME 11/18/18   [provider]  vitamin C (ASCORBIC ACID) 500 MG tablet Take 500 mg by mouth daily.    [provider]    Family History Family History  Problem Relation Age of Onset   Hypertension Mother    Cancer Father    Colon cancer Neg Hx     Social History Social History   Tobacco Use   Smoking status: Former    Packs/day: 1.00    Years: 20.00    Total pack years: 20.00    Types: Cigarettes    Quit date: 01/29/2001    Years since quitting: 21.4   Smokeless tobacco: Never  Vaping Use   Vaping Use: Never used  Substance Use Topics   Alcohol use: Yes    Alcohol/week: 21.0 standard drinks of alcohol  Types: 21 Cans of beer per week    Comment: states "drinks beer about every day"   Drug use: No     Allergies   Shrimp [shellfish allergy]   Review of Systems Review of Systems Per HPI  Physical Exam Triage Vital Signs ED Triage Vitals  Enc Vitals Group     BP 07/18/22 1538 136/62     Pulse Rate 07/18/22 1538 70     Resp 07/18/22 1538 20     Temp 07/18/22 1538 (!) 97.3 F (36.3 C)     Temp Source 07/18/22 1538 Oral     SpO2 07/18/22 1538 92 %     Weight --      Height --      Head Circumference --      Peak Flow --      Pain Score 07/18/22 1539 4     Pain Loc --      Pain Edu? --      Excl. in [place]? --    No data found.  Updated Vital Signs BP 136/62 (BP Location: Right Arm)   Pulse 60   Temp (!) 97.3 F (36.3 C) (Oral)   Resp 20   SpO2 98%   Visual Acuity Right Eye Distance:   Left Eye Distance:   Bilateral Distance:    Right Eye Near:   Left Eye Near:    Bilateral Near:     Physical Exam Vitals and nursing note reviewed.  Constitutional:      General: He is not in acute distress.    Appearance: Normal appearance. He is not ill-appearing or toxic-appearing.  HENT:     Head: Normocephalic and atraumatic.     Right Ear: Ear canal and  external ear normal. A middle ear effusion is present.     Left Ear: Tympanic membrane, ear canal and external ear normal. There is impacted cerumen.     Nose: No congestion or rhinorrhea.     Mouth/Throat:     Mouth: Mucous membranes are moist.     Pharynx: Oropharynx is clear. Posterior oropharyngeal erythema present. No oropharyngeal exudate.  Eyes:     General: No scleral icterus.    Extraocular Movements: Extraocular movements intact.  Cardiovascular:     Rate and Rhythm: Normal rate and regular rhythm.     Heart sounds: Normal heart sounds. No murmur heard. Pulmonary:     Effort: Pulmonary effort is normal. No respiratory distress.     Breath sounds: Decreased air movement present.  Abdominal:     General: Abdomen is flat. Bowel sounds are normal. There is no distension.     Palpations: Abdomen is soft.     Tenderness: There is no abdominal tenderness.  Musculoskeletal:     Cervical back: Normal range of motion and neck supple.  Lymphadenopathy:     Cervical: Cervical adenopathy present.  Skin:    General: Skin is warm and dry.     Coloration: Skin is not jaundiced or pale.     Findings: No erythema or rash.  Neurological:     Mental Status: He is alert and oriented to person, place, and time.     Motor: No weakness.  Psychiatric:        Behavior: Behavior is cooperative.      UC Treatments / Results  Labs (all labs ordered are listed, but only abnormal results are displayed) Labs Reviewed  RESP PANEL BY RT-PCR (FLU A&B, COVID) [id number]    EKG  Radiology No results found.  Procedures Procedures (including critical care time)  Medications Ordered in UC Medications  albuterol (PROVENTIL) (2.5 MG/3ML) 0.083% nebulizer solution 2.5 mg (2.5 mg Nebulization Given 07/18/22 1640)    Initial Impression / Assessment and Plan / UC Course  I have reviewed the triage vital signs and the nursing notes.  Pertinent labs & imaging results that were available during my  care of the patient were reviewed by me and considered in my medical decision making (see chart for details).   Patient is well-appearing, normotensive, afebrile, not tachycardic, not tachypneic, oxygenating well on room air.    Encounter for screening for COVID-19 Viral URI with cough Suspect viral etiology COVID-19, influenza testing obtained Patient is a good candidate for molnupiravir or Tamiflu if he test positive Supportive care discussed Start Tessalon Perles as needed for dry cough Albuterol breathing treatment given today which helped with air movement to bilateral lung fields; oxygen increased from 92% on room air to 98% on room air-no wheezing after bronchodilator ER and return precautions discussed  Impacted cerumen of left ear Start Debrox drops to help break up cerumen Recommended discontinuance of Q-tips  Eustachian tube disorder, right Start Flonase nasal spray Follow-up with PCP if symptoms persist or worsen despite treatment  The patient was given the opportunity to ask questions.  All questions answered to their satisfaction.  The patient is in agreement to this plan.    Final Clinical Impressions(s) / UC Diagnoses   Final diagnoses:  Encounter for screening for COVID-19  Viral URI with cough  Impacted cerumen of left ear  Eustachian tube disorder, right     Discharge Instructions      You have a viral upper respiratory infection.  Symptoms should improve over the next week to 10 days.  If you develop chest pain or shortness of breath, go to the emergency room.  We have tested you today for COVID-19 and influenza.  You will see the results in Mychart and we will call you with positive results.    Please stay home and isolate until you are aware of the results.    We have given you a breathing treatment today which seemed to help open up your airway.  The oxygen level increased from 92% to 98%.  Some things that can make you feel better are: - Increased  rest - Increasing fluid with water/sugar free electrolytes - Acetaminophen and ibuprofen as needed for fever/pain - Salt water gargling, chloraseptic spray and throat lozenges for sore throat - OTC guaifenesin (Mucinex) 600 mg twice daily for congestion - Saline sinus flushes or a neti pot - Humidifying the air -Tessalon Perles every 8 hours as needed for dry cough  Start the Debrox drops for the left ear to help with the ear wax  Start the Flonase nasal spray to help with fluid behind the right ear.     ED Prescriptions     Medication Sig Dispense Auth. Provider   carbamide peroxide (DEBROX) 6.5 % OTIC solution Place 5 drops into the left ear 2 (two) times daily. 15 mL Noemi Chapel A, NP   fluticasone (FLONASE) 50 MCG/ACT nasal spray Place 1 spray into both nostrils daily. 16 g Noemi Chapel A, NP   benzonatate (TESSALON) 100 MG capsule Take 1 capsule (100 mg total) by mouth 3 (three) times daily as needed for cough. Do not take with alcohol or while driving or operating heavy machinery.  May cause drowsiness. 21 capsule Eulogio Bear,  NP      PDMP not reviewed this encounter.   Eulogio Bear, NP 07/18/22 1734

## 2022-07-18 NOTE — ED Triage Notes (Signed)
Sore throat, right ear pain,cough, x 2 to 3 days.

## 2022-07-20 ENCOUNTER — Ambulatory Visit: Payer: Medicare HMO | Admitting: Orthopedic Surgery

## 2022-07-20 DIAGNOSIS — M12571 Traumatic arthropathy, right ankle and foot: Secondary | ICD-10-CM

## 2022-07-21 ENCOUNTER — Encounter: Payer: Self-pay | Admitting: Orthopedic Surgery

## 2022-07-21 NOTE — Progress Notes (Signed)
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  Patient states he was scheduled for surgery prior to [place].  Patient has persistent pain with activities of daily living and is seen for evaluation and treatment.  Assessment & Plan: Visit Diagnoses:  1. Traumatic arthritis of right ankle     Plan: Will plan for a tibial calcaneal fusion.  Patient does have some arthritic changes of the talonavicular joint but is not symptomatic at this location.  Would plan for an anterior ankle and subtalar fusion.  Risks and benefits were discussed including infection neurovascular injury persistent pain need for additional surgery.  Patient states he understands and wishes to proceed at this time.  Follow-Up Instructions: Return in about 2 weeks (around 08/03/2022).   Ortho Exam  Patient is alert, oriented, no adenopathy, well-dressed, normal affect, normal respiratory effort. Examination patient has a good dorsalis pedis and posterior tibial pulse.  Patient has pain to palpation of the anterior tibial talar joint and has pain to palpation over the subtalar joint.  Patient has limited range of motion of the ankle and subtalar joint.  Radiograph shows varus collapse of the talus with a varus hindfoot and degenerative changes of the posterior facet of the subtalar joint.  Bone-on-bone contact of the tibial talar joint with large anterior and posterior bone spurs.  The talonavicular joint has bony spurs dorsally but does have maintenance of joint space.  Imaging: No results found. No images are attached to the encounter.  Labs: Lab  Results  Component Value Date   REPTSTATUS 12/22/2021 FINAL 12/21/2021   CULT  12/21/2021    NO GROWTH Performed at [facility] [address] [place], [place] [address]    LABORGA KLEBSIELLA OXYTOCA (A) 10/08/2018     Lab Results  Component Value Date   ALBUMIN 3.7 03/25/2018   ALBUMIN 3.7 02/06/2018   ALBUMIN 3.2 (L) 01/30/2018    Lab Results  Component Value Date   MG 2.0 01/30/2018   No results found for: "VD25OH"  No results found for: "PREALBUMIN"    Latest Ref Rng & Units 03/27/2018    4:11 AM 03/26/2018    9:49 AM 03/25/2018   11:50 PM  CBC EXTENDED  WBC 4.0 - 10.5 K/uL 5.8   5.7   RBC 4.22 - 5.81 MIL/uL 3.26   3.27   Hemoglobin 13.0 - 17.0 g/dL 10.3  10.7  10.2   HCT 39.0 - 52.0 % 31.1  31.9  31.6   Platelets 150 - 400 K/uL 163   166      There is no height or weight on file to calculate BMI.  Orders:  No orders of the defined types were placed in this encounter.  No orders of the defined types were placed in this encounter.    Procedures: No procedures performed  Clinical Data: No additional findings.  ROS:  All other systems negative, except as noted in the HPI. Review of  Systems  Objective: Vital Signs: There were no vitals taken for this visit.  Specialty Comments:  No specialty comments available.  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   Spondylolisthesis of lumbar region 06/03/2018   Hemorrhage of colon following colonoscopy    Blood in stool 03/25/2018   Rectal bleeding    Special screening for malignant neoplasms, colon    Benign neoplasm of ascending colon    Benign neoplasm of descending colon    Rectal polyp    NSTEMI (non-ST elevated myocardial  infarction) ([place]) 01/30/2018   Sepsis ([place]) 01/29/2018   Acute lower UTI 01/29/2018   HLD (hyperlipidemia) 01/29/2018   HTN (hypertension) 01/29/2018   Elevated troponin 01/29/2018   Non-recurrent unilateral inguinal hernia without obstruction or gangrene    Umbilical hernia without obstruction and without gangrene    Past Medical History:  Diagnosis Date   Anxiety    Arthritis    BPH (benign prostatic hyperplasia)    Elevated troponin 01/30/2018   a. 01/2018: Type 2 NSTEMI most consistent with demand ischemia in the setting of Urosepsis.    Hypercholesteremia    Hypertension     Family History  Problem Relation Age of Onset   Hypertension Mother    Cancer Father    Colon cancer Neg Hx     Past Surgical History:  Procedure Laterality Date   BACK SURGERY     lumbar disc   CATARACT EXTRACTION W/PHACO Right 02/05/2017   Procedure: CATARACT EXTRACTION PHACO AND INTRAOCULAR LENS PLACEMENT (IOC);  Surgeon: Tonny Branch, MD;  Location: [facility];  Service: Ophthalmology;  Laterality: Right;  CDE: 7.36   CATARACT EXTRACTION W/PHACO Left 02/19/2017   Procedure: CATARACT EXTRACTION PHACO AND INTRAOCULAR LENS PLACEMENT LEFT EYE;  Surgeon: Tonny Branch, MD;  Location: [facility];  Service: Ophthalmology;  Laterality: Left;  CDE: 7.98   COLONOSCOPY     COLONOSCOPY N/A 03/19/2018   Procedure: COLONOSCOPY;  Surgeon: Aviva Signs, MD;  Location: [facility];  Service: Gastroenterology;  Laterality: N/A;   COLONOSCOPY N/A 03/26/2018   Procedure: COLONOSCOPY;  Surgeon: Aviva Signs, MD;  Location: [facility];  Service: Gastroenterology;  Laterality: N/A;   COLONOSCOPY N/A 05/17/2021   Procedure: COLONOSCOPY;  Surgeon: Aviva Signs, MD;  Location: [facility];  Service: Gastroenterology;  Laterality: N/A;   INGUINAL HERNIA REPAIR Right 12/14/2017   Procedure: HERNIA REPAIR INGUINAL ADULT WITH MESH;  Surgeon: Aviva Signs, MD;  Location: [facility];  Service: General;  Laterality: Right;   JOINT  REPLACEMENT Left    POLYPECTOMY  03/19/2018   Procedure: POLYPECTOMY;  Surgeon: Aviva Signs, MD;  Location: [facility];  Service: Gastroenterology;;   POLYPECTOMY  05/17/2021   Procedure: POLYPECTOMY;  Surgeon: Aviva Signs, MD;  Location: [facility];  Service: Gastroenterology;;   UMBILICAL HERNIA REPAIR N/A 12/14/2017   Procedure: HERNIA REPAIR UMBILICAL ADULT WITH MESH;  Surgeon: Aviva Signs, MD;  Location: [facility];  Service: General;  Laterality: N/A;   Social History   Occupational History   Not on file  Tobacco Use   Smoking status: Former    Packs/day: 1.00    Years: 20.00    Total pack years: 20.00    Types: Cigarettes  Quit date: 01/29/2001    Years since quitting: 21.4   Smokeless tobacco: Never  Vaping Use   Vaping Use: Never used  Substance and Sexual Activity   Alcohol use: Yes    Alcohol/week: 21.0 standard drinks of alcohol    Types: 21 Cans of beer per week    Comment: states "drinks beer about every day"   Drug use: No   Sexual activity: Yes    Birth control/protection: None

## 2022-09-05 ENCOUNTER — Encounter (HOSPITAL_COMMUNITY): Payer: Self-pay | Admitting: Orthopedic Surgery

## 2022-09-05 NOTE — Progress Notes (Signed)
PCP - Dr Redmond School Cardiologist - none currently, in past, seen Dr Bronson Ing  Chest x-ray - n/a EKG - DOS Stress Test - n/a ECHO - 01/30/18 Cardiac Cath - n/a  ICD Pacemaker/Loop - n/a  Sleep Study -  n/a CPAP - none  Diabetes - n/a  ERAS: Clear liquids til 7:50 AM DOS.  Anesthesia review: Yes  STOP now taking any Aspirin (unless otherwise instructed by your surgeon), Aleve, Naproxen, Ibuprofen, Motrin, Advil, Goody's, BC's, all herbal medications, fish oil, and all vitamins.   Coronavirus Screening Does the patient have any of the following symptoms:  Cough yes/no: No Fever (>100.75F)  yes/no: No Runny nose yes/no: No Sore throat yes/no: No Difficulty breathing/shortness of breath  yes/no: No  Have you traveled in the last 14 days and where? yes/no: No  Patient verbalized understanding of instructions that were given via phone.

## 2022-09-06 ENCOUNTER — Encounter (HOSPITAL_COMMUNITY)
Admission: RE | Disposition: A | Discharge status: Home / Self Care | Payer: Self-pay | Source: Home / Self Care | Attending: Orthopedic Surgery

## 2022-09-06 ENCOUNTER — Ambulatory Visit (HOSPITAL_BASED_OUTPATIENT_CLINIC_OR_DEPARTMENT_OTHER): Payer: Medicare HMO | Admitting: Physician Assistant

## 2022-09-06 ENCOUNTER — Encounter (HOSPITAL_COMMUNITY): Payer: Self-pay | Admitting: Orthopedic Surgery

## 2022-09-06 ENCOUNTER — Ambulatory Visit (HOSPITAL_COMMUNITY)
Admission: RE | Admit: 2022-09-06 | Discharge: 2022-09-06 | Disposition: A | Discharge status: Home / Self Care | Payer: Medicare HMO | Attending: Orthopedic Surgery | Admitting: Orthopedic Surgery

## 2022-09-06 ENCOUNTER — Other Ambulatory Visit: Payer: Self-pay

## 2022-09-06 ENCOUNTER — Ambulatory Visit (HOSPITAL_COMMUNITY): Payer: Medicare HMO | Admitting: Physician Assistant

## 2022-09-06 DIAGNOSIS — I252 Old myocardial infarction: Secondary | ICD-10-CM

## 2022-09-06 DIAGNOSIS — J988 Other specified respiratory disorders: Secondary | ICD-10-CM | POA: Insufficient documentation

## 2022-09-06 DIAGNOSIS — M19071 Primary osteoarthritis, right ankle and foot: Secondary | ICD-10-CM | POA: Insufficient documentation

## 2022-09-06 DIAGNOSIS — Z09 Encounter for follow-up examination after completed treatment for conditions other than malignant neoplasm: Secondary | ICD-10-CM | POA: Insufficient documentation

## 2022-09-06 DIAGNOSIS — Z87891 Personal history of nicotine dependence: Secondary | ICD-10-CM | POA: Diagnosis not present

## 2022-09-06 DIAGNOSIS — M19171 Post-traumatic osteoarthritis, right ankle and foot: Secondary | ICD-10-CM

## 2022-09-06 DIAGNOSIS — M12571 Traumatic arthropathy, right ankle and foot: Secondary | ICD-10-CM | POA: Diagnosis not present

## 2022-09-06 DIAGNOSIS — I272 Pulmonary hypertension, unspecified: Secondary | ICD-10-CM

## 2022-09-06 DIAGNOSIS — I10 Essential (primary) hypertension: Secondary | ICD-10-CM | POA: Diagnosis not present

## 2022-09-06 DIAGNOSIS — G8918 Other acute postprocedural pain: Secondary | ICD-10-CM | POA: Diagnosis not present

## 2022-09-06 HISTORY — PX: FOOT ARTHRODESIS: SHX1655

## 2022-09-06 LAB — COMPREHENSIVE METABOLIC PANEL
ALT: 18 U/L (ref 0–44)
AST: 24 U/L (ref 15–41)
Albumin: 3.7 g/dL (ref 3.5–5.0)
Alkaline Phosphatase: 68 U/L (ref 38–126)
Anion gap: 10 (ref 5–15)
BUN: 13 mg/dL (ref 8–23)
CO2: 23 mmol/L (ref 22–32)
Calcium: 8.9 mg/dL (ref 8.9–10.3)
Chloride: 104 mmol/L (ref 98–111)
Creatinine, Ser: 0.81 mg/dL (ref 0.61–1.24)
GFR, Estimated: >60 mL/min (ref >60)
Glucose, Bld: 118 mg/dL — ABNORMAL HIGH (ref 70–99)
Potassium: 4.1 mmol/L (ref 3.5–5.1)
Sodium: 137 mmol/L (ref 135–145)
Total Bilirubin: 0.7 mg/dL (ref 0.3–1.2)
Total Protein: 6.1 g/dL — ABNORMAL LOW (ref 6.5–8.1)

## 2022-09-06 LAB — CBC
HCT: 44.6 % (ref 39.0–52.0)
Hemoglobin: 15.4 g/dL (ref 13.0–17.0)
MCH: 31.8 pg (ref 26.0–34.0)
MCHC: 34.5 g/dL (ref 30.0–36.0)
MCV: 92 fL (ref 80.0–100.0)
Platelets: 170 10*3/uL (ref 150–400)
RBC: 4.85 MIL/uL (ref 4.22–5.81)
RDW: 13.9 % (ref 11.5–15.5)
WBC: 4 10*3/uL (ref 4.0–10.5)
nRBC: 0 % (ref 0.0–0.2)

## 2022-09-06 SURGERY — FUSION, JOINT, FOOT
Anesthesia: General | Site: Foot | Laterality: Right

## 2022-09-06 MED ORDER — ORAL CARE MOUTH RINSE: 15.0000 mL | Freq: Once | OROMUCOSAL | Status: AC

## 2022-09-06 MED ORDER — PHENYLEPHRINE HCL-NACL 20-0.9 MG/250ML-% IV SOLN
INTRAVENOUS | Status: DC | PRN
  Administered 2022-09-06: 40 ug/min via INTRAVENOUS

## 2022-09-06 MED ORDER — PHENYLEPHRINE 80 MCG/ML (10ML) SYRINGE FOR IV PUSH (FOR BLOOD PRESSURE SUPPORT)
PREFILLED_SYRINGE | INTRAVENOUS | Status: DC | PRN
  Administered 2022-09-06 (×2): 80 ug via INTRAVENOUS
  Administered 2022-09-06: 160 ug via INTRAVENOUS
  Administered 2022-09-06: 80 ug via INTRAVENOUS

## 2022-09-06 MED ORDER — IPRATROPIUM-ALBUTEROL 0.5-2.5 (3) MG/3ML IN SOLN
RESPIRATORY_TRACT | Status: AC
  Administered 2022-09-06: 3 mL via RESPIRATORY_TRACT
  Filled 2022-09-06: qty 3

## 2022-09-06 MED ORDER — EPHEDRINE 5 MG/ML INJ
INTRAVENOUS | Status: AC
  Filled 2022-09-06: qty 5

## 2022-09-06 MED ORDER — LIDOCAINE 2% (20 MG/ML) 5 ML SYRINGE
INTRAMUSCULAR | Status: AC
  Filled 2022-09-06: qty 10

## 2022-09-06 MED ORDER — DEXAMETHASONE SODIUM PHOSPHATE 4 MG/ML IJ SOLN
INTRAMUSCULAR | Status: DC | PRN
  Administered 2022-09-06: 2 mg via PERINEURAL
  Administered 2022-09-06: 3 mg via PERINEURAL

## 2022-09-06 MED ORDER — PROMETHAZINE HCL 25 MG/ML IJ SOLN: 6.2500 mg | INTRAMUSCULAR | Status: DC | PRN

## 2022-09-06 MED ORDER — MEPERIDINE HCL 25 MG/ML IJ SOLN: 6.2500 mg | INTRAMUSCULAR | Status: DC | PRN

## 2022-09-06 MED ORDER — PHENYLEPHRINE 80 MCG/ML (10ML) SYRINGE FOR IV PUSH (FOR BLOOD PRESSURE SUPPORT)
PREFILLED_SYRINGE | INTRAVENOUS | Status: AC
  Filled 2022-09-06: qty 10

## 2022-09-06 MED ORDER — EPHEDRINE SULFATE-NACL 50-0.9 MG/10ML-% IV SOSY
PREFILLED_SYRINGE | INTRAVENOUS | Status: DC | PRN
  Administered 2022-09-06: 10 mg via INTRAVENOUS
  Administered 2022-09-06: 5 mg via INTRAVENOUS
  Administered 2022-09-06 (×2): 10 mg via INTRAVENOUS

## 2022-09-06 MED ORDER — AMISULPRIDE (ANTIEMETIC) 5 MG/2ML IV SOLN: 10.0000 mg | Freq: Once | INTRAVENOUS | Status: DC | PRN

## 2022-09-06 MED ORDER — PROPOFOL 10 MG/ML IV BOLUS
INTRAVENOUS | Status: DC | PRN
  Administered 2022-09-06: 200 mg via INTRAVENOUS

## 2022-09-06 MED ORDER — DEXAMETHASONE SODIUM PHOSPHATE 10 MG/ML IJ SOLN
INTRAMUSCULAR | Status: DC | PRN
  Administered 2022-09-06: 10 mg via INTRAVENOUS

## 2022-09-06 MED ORDER — ACETAMINOPHEN 500 MG PO TABS
1000.0000 mg | ORAL_TABLET | Freq: Once | ORAL | Status: DC
  Filled 2022-09-06: qty 2

## 2022-09-06 MED ORDER — GLYCOPYRROLATE 0.2 MG/ML IJ SOLN
INTRAMUSCULAR | Status: DC | PRN
  Administered 2022-09-06: .2 mg via INTRAVENOUS

## 2022-09-06 MED ORDER — CEFAZOLIN IN SODIUM CHLORIDE 3-0.9 GM/100ML-% IV SOLN
3.0000 g | INTRAVENOUS | Status: AC
  Administered 2022-09-06: 3 g via INTRAVENOUS
  Filled 2022-09-06: qty 100

## 2022-09-06 MED ORDER — ACETAMINOPHEN 500 MG PO TABS: 1000.0000 mg | ORAL_TABLET | Freq: Once | ORAL | Status: DC

## 2022-09-06 MED ORDER — LACTATED RINGERS IV SOLN: INTRAVENOUS | Status: DC

## 2022-09-06 MED ORDER — MIDAZOLAM HCL 2 MG/2ML IJ SOLN
INTRAMUSCULAR | Status: AC
  Administered 2022-09-06: 1 mg via INTRAVENOUS
  Filled 2022-09-06: qty 2

## 2022-09-06 MED ORDER — 0.9 % SODIUM CHLORIDE (POUR BTL) OPTIME
TOPICAL | Status: DC | PRN
  Administered 2022-09-06: 1000 mL

## 2022-09-06 MED ORDER — FENTANYL CITRATE (PF) 250 MCG/5ML IJ SOLN
INTRAMUSCULAR | Status: AC
  Filled 2022-09-06: qty 5

## 2022-09-06 MED ORDER — FENTANYL CITRATE (PF) 100 MCG/2ML IJ SOLN: 50.0000 ug | Freq: Once | INTRAMUSCULAR | Status: AC

## 2022-09-06 MED ORDER — CHLORHEXIDINE GLUCONATE 0.12 % MT SOLN
15.0000 mL | Freq: Once | OROMUCOSAL | Status: AC
  Administered 2022-09-06: 15 mL via OROMUCOSAL
  Filled 2022-09-06: qty 15

## 2022-09-06 MED ORDER — IPRATROPIUM-ALBUTEROL 0.5-2.5 (3) MG/3ML IN SOLN: 3.0000 mL | Freq: Once | RESPIRATORY_TRACT | Status: AC

## 2022-09-06 MED ORDER — ROPIVACAINE HCL 5 MG/ML IJ SOLN
INTRAMUSCULAR | Status: DC | PRN
  Administered 2022-09-06: 15 mL via PERINEURAL
  Administered 2022-09-06: 25 mL via PERINEURAL

## 2022-09-06 MED ORDER — GLYCOPYRROLATE PF 0.2 MG/ML IJ SOSY
PREFILLED_SYRINGE | INTRAMUSCULAR | Status: AC
  Filled 2022-09-06: qty 1

## 2022-09-06 MED ORDER — ONDANSETRON HCL 4 MG/2ML IJ SOLN
INTRAMUSCULAR | Status: DC | PRN
  Administered 2022-09-06: 4 mg via INTRAVENOUS

## 2022-09-06 MED ORDER — HYDROMORPHONE HCL 1 MG/ML IJ SOLN: 0.2500 mg | INTRAMUSCULAR | Status: DC | PRN

## 2022-09-06 MED ORDER — OXYCODONE-ACETAMINOPHEN 5-325 MG PO TABS: 1.0000 | ORAL_TABLET | ORAL | 0 refills | Status: AC | PRN

## 2022-09-06 MED ORDER — MIDAZOLAM HCL 2 MG/2ML IJ SOLN: 1.0000 mg | Freq: Once | INTRAMUSCULAR | Status: AC

## 2022-09-06 MED ORDER — FENTANYL CITRATE (PF) 100 MCG/2ML IJ SOLN
INTRAMUSCULAR | Status: AC
  Administered 2022-09-06: 50 ug via INTRAVENOUS
  Filled 2022-09-06: qty 2

## 2022-09-06 MED ORDER — CLONIDINE HCL (ANALGESIA) 100 MCG/ML EP SOLN
EPIDURAL | Status: DC | PRN
  Administered 2022-09-06: 50 ug
  Administered 2022-09-06: 30 ug

## 2022-09-06 MED ORDER — LIDOCAINE 2% (20 MG/ML) 5 ML SYRINGE
INTRAMUSCULAR | Status: DC | PRN
  Administered 2022-09-06: 100 mg via INTRAVENOUS

## 2022-09-06 SURGICAL SUPPLY — 53 items
BAG COUNTER SPONGE SURGICOUNT (BAG) ×1 IMPLANT
BANDAGE ESMARK 6X9 LF (GAUZE/BANDAGES/DRESSINGS) IMPLANT
BIT DRILL SA 2.7 NS (DRILL) IMPLANT
BIT DRILL SA 3.5 NS (DRILL) IMPLANT
BLADE SAW SGTL HD 18.5X60.5X1. (BLADE) ×1 IMPLANT
BLADE SURG 10 STRL SS (BLADE) IMPLANT
BNDG COHESIVE 4X5 TAN STRL (GAUZE/BANDAGES/DRESSINGS) ×1 IMPLANT
BNDG ESMARK 6X9 LF (GAUZE/BANDAGES/DRESSINGS)
BNDG GAUZE DERMACEA FLUFF 4 (GAUZE/BANDAGES/DRESSINGS) ×2 IMPLANT
COTTON STERILE ROLL (GAUZE/BANDAGES/DRESSINGS) ×1 IMPLANT
COVER MAYO STAND STRL (DRAPES) IMPLANT
COVER SURGICAL LIGHT HANDLE (MISCELLANEOUS) ×2 IMPLANT
DRAPE INCISE IOBAN 66X45 STRL (DRAPES) ×1 IMPLANT
DRAPE OEC MINIVIEW 54X84 (DRAPES) IMPLANT
DRAPE U-SHAPE 47X51 STRL (DRAPES) ×1 IMPLANT
DRILL SA 2.7 NS (DRILL) ×2
DRILL SA 3.5 NS (DRILL) ×1
DRIVER CANN SA T20 (ORTHOPEDIC DISPOSABLE SUPPLIES) IMPLANT
DRSG ADAPTIC 3X8 NADH LF (GAUZE/BANDAGES/DRESSINGS) ×1 IMPLANT
DURAPREP 26ML APPLICATOR (WOUND CARE) ×1 IMPLANT
ELECT REM PT RETURN 9FT ADLT (ELECTROSURGICAL) ×1
ELECTRODE REM PT RTRN 9FT ADLT (ELECTROSURGICAL) ×1 IMPLANT
GAUZE SPONGE 4X4 12PLY STRL (GAUZE/BANDAGES/DRESSINGS) ×1 IMPLANT
GLOVE BIOGEL PI IND STRL 9 (GLOVE) ×1 IMPLANT
GLOVE SURG ORTHO 9.0 STRL STRW (GLOVE) ×1 IMPLANT
GOWN STRL REUS W/ TWL XL LVL3 (GOWN DISPOSABLE) ×3 IMPLANT
GOWN STRL REUS W/TWL XL LVL3 (GOWN DISPOSABLE) ×3
K-WIRE STE SA 2 (WIRE) ×1
KIT BASIN OR (CUSTOM PROCEDURE TRAY) ×1 IMPLANT
KIT TURNOVER KIT B (KITS) ×1 IMPLANT
KWIRE STE SA 2 (WIRE) IMPLANT
MANIFOLD NEPTUNE II (INSTRUMENTS) ×1 IMPLANT
NS IRRIG 1000ML POUR BTL (IV SOLUTION) ×1 IMPLANT
PACK ORTHO EXTREMITY (CUSTOM PROCEDURE TRAY) ×1 IMPLANT
PAD ARMBOARD 7.5X6 YLW CONV (MISCELLANEOUS) ×2 IMPLANT
PAD CAST 4YDX4 CTTN HI CHSV (CAST SUPPLIES) ×1 IMPLANT
PADDING CAST COTTON 4X4 STRL (CAST SUPPLIES) ×1
PLATE ANT SA STD RT (Plate) IMPLANT
PUTTY DBM STAGRAFT PLUS 5CC (Putty) IMPLANT
SCREW LOCK SA 3.5X28 (Screw) IMPLANT
SCREW LOCK SA NS 3.5X26 (Screw) IMPLANT
SCREW LOCK SA STRAT 3.5X24 (Screw) IMPLANT
SCREW NLOCK SA 5X28 (Screw) IMPLANT
SCREW NLOCK SA NS 3.5X26 (Screw) IMPLANT
SCREW NLOCK SA NS 5X30 (Screw) IMPLANT
SPONGE T-LAP 18X18 ~~LOC~~+RFID (SPONGE) ×1 IMPLANT
SUCTION FRAZIER HANDLE 10FR (MISCELLANEOUS) ×1
SUCTION TUBE FRAZIER 10FR DISP (MISCELLANEOUS) ×1 IMPLANT
SUT ETHILON 2 0 PSLX (SUTURE) ×3 IMPLANT
TOWEL GREEN STERILE (TOWEL DISPOSABLE) ×1 IMPLANT
TOWEL GREEN STERILE FF (TOWEL DISPOSABLE) ×1 IMPLANT
TUBE CONNECTING 12X1/4 (SUCTIONS) ×1 IMPLANT
WATER STERILE IRR 1000ML POUR (IV SOLUTION) ×1 IMPLANT

## 2022-09-06 NOTE — Anesthesia Preprocedure Evaluation (Addendum)
Anesthesia Evaluation  Patient identified by MRN, date of birth, ID band Patient awake    Reviewed: Allergy & Precautions, NPO status , Patient's Chart, lab work & pertinent test results  Airway Mallampati: II  TM Distance: >3 FB Neck ROM: Full    Dental no notable dental hx. (+) Dental Advisory Given   Pulmonary former smoker   Pulmonary exam normal breath sounds clear to auscultation       Cardiovascular hypertension, pulmonary hypertension+ Past MI   Rhythm:Regular Rate:Normal  Echo 01/2018 - Left ventricle: The cavity size was normal. Wall thickness was   increased in a pattern of mild LVH. Systolic function was normal.   The estimated ejection fraction was in the range of 60% to 65%.   Wall motion was normal; there were no regional wall motion   abnormalities. Features are consistent with a pseudonormal left   ventricular filling pattern, with concomitant abnormal relaxation   and increased filling pressure (grade 2 diastolic dysfunction).   Doppler parameters are consistent with high ventricular filling   pressure.  - Aortic valve: Mildly thickened, mildly calcified leaflets.   Sclerosis without stenosis.  - Aorta: Minimally enlarged aortic root.  - Left atrium: The atrium was moderately dilated.  - Tricuspid valve: There was mild regurgitation.  - Pulmonary arteries: PA peak pressure: 44 mm Hg (S).  - Systemic veins: IVC is dilated with normal respiratory variation.   Estimated CVP 8 mmHg.     Neuro/Psych negative neurological ROS     GI/Hepatic negative GI ROS, Neg liver ROS,,,  Endo/Other  negative endocrine ROS    Renal/GU negative Renal ROS     Musculoskeletal negative musculoskeletal ROS (+)    Abdominal  (+) + obese  Peds  Hematology negative hematology ROS (+)   Anesthesia Other Findings   Reproductive/Obstetrics                             Anesthesia Physical Anesthesia  Plan  ASA: 3  Anesthesia Plan: General   Post-op Pain Management: Tylenol PO (pre-op)* and Regional block*   Induction: Intravenous  PONV Risk Score and Plan: 2 and Ondansetron, Dexamethasone and Treatment may vary due to age or medical condition  Airway Management Planned: LMA  Additional Equipment:   Intra-op Plan:   Post-operative Plan: Extubation in OR  Informed Consent: I have reviewed the patients History and Physical, chart, labs and discussed the procedure including the risks, benefits and alternatives for the proposed anesthesia with the patient or authorized representative who has indicated his/her understanding and acceptance.     Dental advisory given  Plan Discussed with: CRNA  Anesthesia Plan Comments:         Anesthesia Quick Evaluation

## 2022-09-06 NOTE — Op Note (Signed)
09/06/2022  11:47 AM  PATIENT:  Jerry Boyer    PRE-OPERATIVE DIAGNOSIS:  Traumatic Arthritis Right Ankle and Foot  POST-OPERATIVE DIAGNOSIS:  Same  PROCEDURE:  RIGHT anterior ankle fusion. C-arm fluoroscopy verified reduction. Application of stay graft 5 cc.  SURGEON:  Newt Minion, MD  PHYSICIAN ASSISTANT:None ANESTHESIA:   General  PREOPERATIVE INDICATIONS:  Jerry Boyer is a  77 y.o. male with a diagnosis of Traumatic Arthritis Right Ankle and Foot who failed conservative measures and elected for surgical management.    The risks benefits and alternatives were discussed with the patient preoperatively including but not limited to the risks of infection, bleeding, nerve injury, cardiopulmonary complications, the need for revision surgery, among others, and the patient was willing to proceed.  OPERATIVE IMPLANTS: Anterior tibial talar fusion plate and stay graft.  '@ENCIMAGES'$ @  OPERATIVE FINDINGS: C-arm fluoroscopy verified reduction in both AP and lateral planes.  OPERATIVE PROCEDURE: Patient was brought the operating room after undergoing a regional anesthetic.  After adequate levels anesthesia were obtained patient's right lower extremity was prepped using DuraPrep draped into a sterile field a timeout was called.  An anterior incision was made over the interval between the anterior tibial tendon and the EHL tendon.  Blunt dissection was carried down to bone.  The neurovascular bundle and the EHL tendon were retracted laterally the anterior tibial tendon was retracted medially.  Subperiosteal dissection was used to extend the incision down to the tibial talar joint.  There were large osteophytic bone spurs on the anterior aspect of the tibia and the talus.  These were resected.  With retractors placed protecting the tendons and neurovascular bundle oscillating saw was used to make cuts perpendicular to the long axis of the tibia on the tibial talar joint.  The joint was reduced  with the heel in slight valgus.  A rondure and osteotome were used to remove the further bony spurs.  The joint surface had good bleeding subchondral bone.  The wound was irrigated with normal saline and stay graft was placed within the tibial talar joint.  The joint was reduced and the anterior fusion plate was used secured proximally in the slot with a compression screw distally a compression and locking screws were placed in the talus followed by a second locking screw.  The joint was further reduced past in the proximal compression screw was read compressed.  For more locking screws were placed proximally for a total of 5 screws proximally 3 screws distally.  C-arm fluoroscopy verified reduction of the tibial talar joint.  Patient had a good joint space of the subtalar joint and this was not fused.  The wound was further irrigated the incision closed using 2-0 nylon a sterile dressing was applied patient was taken the PACU in stable condition.   DISCHARGE PLANNING:  Antibiotic duration: Preoperative antibiotics  Weightbearing: Touchdown weightbearing on the right in the fracture boot  Pain medication: Prescription for Percocet  Dressing care/ Wound VAC: Dry dressing  Ambulatory devices: Crutches  Discharge to: Home.  Follow-up: In the office 1 week post operative.

## 2022-09-06 NOTE — Anesthesia Procedure Notes (Signed)
Anesthesia Regional Block: Adductor canal block   Pre-Anesthetic Checklist: , timeout performed,  Correct Patient, Correct Site, Correct Laterality,  Correct Procedure, Correct Position, site marked,  Risks and benefits discussed,  Surgical consent,  Pre-op evaluation,  At surgeon's request and post-op pain management  Laterality: Lower and Right  Prep: chloraprep       Needles:  Injection technique: Single-shot  Needle Type: Stimiplex     Needle Length: 9cm  Needle Gauge: 21     Additional Needles:   Procedures:,,,, ultrasound used (permanent image in chart),,    Narrative:  Start time: 09/06/2022 9:02 AM End time: 09/06/2022 9:17 AM Injection made incrementally with aspirations every 5 mL.  Performed by: Personally  Anesthesiologist: Nolon Nations, MD  Additional Notes: BP cuff, EKG monitors applied. Sedation begun. Artery and nerve location verified with ultrasound. Anesthetic injected incrementally (17m), slowly, and after negative aspirations under direct u/s guidance. Good fascial/perineural spread. Tolerated well.

## 2022-09-06 NOTE — Evaluation (Signed)
Physical Therapy Evaluation Patient Details Name: Jerry Boyer MRN: [id number] DOB: 1946/03/02 Today's Date: 09/06/2022  History of Present Illness  77 y.o. male presents to [facility] hospital on 09/06/2022 for R anterior ankle fusion. PMH includes anxiety, OA, BPH, HTN.  Clinical Impression  Pt presents to PT with deficits in functional mobility, gait, balance, power. Pt is able to transfer from higher surfaces and hop for very short household distances with support of RW. Pt with difficulty clearing RUE in order to simulate hopping up a step. PT provides education on method with use of crutch and assist of caregiver, pt unsuccessful with clearing foot sufficiently. PT provides further education on hopping up steps backward with use of walker and caregiver support of walker to stabilize. Pt declines attempts at training this method at this time. If pt remains admitted he will benefit from continued gait and stair training to reduce falls risk and improve activity tolerance. PT recommends discharge home with HHPT in an effort to improve tolerance for ambulation and to provide further stair training.       Recommendations for follow up therapy are one component of a multi-disciplinary discharge planning process, led by the attending physician.  Recommendations may be updated based on patient status, additional functional criteria and insurance authorization.  Follow Up Recommendations Home health PT      Assistance Recommended at Discharge Intermittent Supervision/Assistance  Patient can return home with the following  A little help with walking and/or transfers;A little help with bathing/dressing/bathroom;Assistance with cooking/housework;Assist for transportation;Help with stairs or ramp for entrance (+2 assist for stairs)    Equipment Recommendations None recommended by PT (pt owns RW)  Recommendations for Other Services       Functional Status Assessment Patient has had a recent decline in their  functional status and demonstrates the ability to make significant improvements in function in a reasonable and predictable amount of time.     Precautions / Restrictions Precautions Precautions: Fall Required Braces or Orthoses: Other Brace Other Brace: CAM boot RLE Restrictions Weight Bearing Restrictions: Yes RLE Weight Bearing: Touchdown weight bearing      Mobility  Bed Mobility Overal bed mobility: Modified Independent Bed Mobility: Supine to Sit     Supine to sit: Modified independent (Device/Increase time)          Transfers Overall transfer level: Needs assistance Equipment used: Rolling walker (2 wheels) Transfers: Sit to/from Stand Sit to Stand: Min guard           General transfer comment: verbal cues for technique    Ambulation/Gait Ambulation/Gait assistance: Min guard Gait Distance (Feet): 15 Feet Assistive device: Rolling walker (2 wheels) Gait Pattern/deviations: Step-to pattern Gait velocity: reduced Gait velocity interpretation: <1.31 ft/sec, indicative of household ambulator   General Gait Details: slowed step-to gait, reduced stance time and WB through RLE to maintain WB precautions  Stairs Stairs:  (Simulated stair negotiation with 1 assist and crutch unsuccessfully. PT educates on stair negotiation backward with RW, assist of Nicole Kindred to stabilize walker against steps and to assist at trunk with gait belt. Pt declines physically training this method.)          Wheelchair Mobility    Modified Rankin (Stroke Patients Only)       Balance Overall balance assessment: Needs assistance Sitting-balance support: No upper extremity supported, Feet supported Sitting balance-Leahy Scale: Good     Standing balance support: Bilateral upper extremity supported, Reliant on assistive device for balance Standing balance-Leahy Scale: Poor  Pertinent Vitals/Pain Pain Assessment Pain Assessment:  Faces Faces Pain Scale: No hurt    Home Living Family/patient expects to be discharged to:: Private residence Living Arrangements: Other (Comment) [place]) Available Help at Discharge: Family;Available 24 hours/day (limited physical assistance from Montel Clock can provide physical assist to negotiate stairs but does not live with the patient) Type of Home: House Home Access: Stairs to enter Entrance Stairs-Rails: None Entrance Stairs-Number of Steps: 3   Home Layout: One level Home Equipment: Counsellor (2 wheels)      Prior Function Prior Level of Function : Independent/Modified Independent;Driving                     Hand Dominance        Extremity/Trunk Assessment   Upper Extremity Assessment Upper Extremity Assessment: Overall WFL for tasks assessed    Lower Extremity Assessment Lower Extremity Assessment: RLE deficits/detail RLE Deficits / Details: R foot/toes numb, no sensation to light touch, Ankle ROM/strength not assessed. Pt in CAM boot, knee/hip strength appears WFL    Cervical / Trunk Assessment Cervical / Trunk Assessment: Other exceptions Cervical / Trunk Exceptions: obesity  Communication   Communication: No difficulties  Cognition Arousal/Alertness: Awake/alert Behavior During Therapy: WFL for tasks assessed/performed Overall Cognitive Status: Within Functional Limits for tasks assessed                                          General Comments General comments (skin integrity, edema, etc.): VSS on RA    Exercises     Assessment/Plan    PT Assessment Patient needs continued PT services  PT Problem List Decreased activity tolerance;Decreased balance;Decreased mobility;Decreased knowledge of use of DME;Decreased knowledge of precautions       PT Treatment Interventions DME instruction;Gait training;Stair training;Functional mobility training;Balance training;Neuromuscular re-education;Patient/family  education;Therapeutic activities;Therapeutic exercise    PT Goals (Current goals can be found in the Care Plan section)  Acute Rehab PT Goals Patient Stated Goal: to go home PT Goal Formulation: With patient Time For Goal Achievement: 09/20/22 Potential to Achieve Goals: Fair    Frequency Min 5X/week     Co-evaluation               AM-PAC PT "6 Clicks" Mobility  Outcome Measure Help needed turning from your back to your side while in a flat bed without using bedrails?: None Help needed moving from lying on your back to sitting on the side of a flat bed without using bedrails?: None Help needed moving to and from a bed to a chair (including a wheelchair)?: A Little Help needed standing up from a chair using your arms (e.g., wheelchair or bedside chair)?: A Little Help needed to walk in hospital room?: A Little Help needed climbing 3-5 steps with a railing? : Total 6 Click Score: 18    End of Session Equipment Utilized During Treatment: Gait belt Activity Tolerance: Patient tolerated treatment well Patient left: in bed;with call bell/phone within reach;with nursing/sitter in room Nurse Communication: Mobility status PT Visit Diagnosis: Other abnormalities of gait and mobility (R26.89);Difficulty in walking, not elsewhere classified (R26.2)    Time: 6160-7371 PT Time Calculation (min) (ACUTE ONLY): 44 min   Charges:   PT Evaluation $PT Eval Low Complexity: 1 Low PT Treatments $Gait Training: 8-22 mins        Zenaida Niece, PT, DPT Acute Rehabilitation Office [phone]  Zenaida Niece 09/06/2022, 2:16 PM

## 2022-09-06 NOTE — Transfer of Care (Signed)
Immediate Anesthesia Transfer of Care Note  Patient: Jerry Boyer  Procedure(s) Performed: RIGHT TIBIOCALCANEAL FUSION (Right: Foot)  Patient Location: PACU  Anesthesia Type:General  Level of Consciousness: awake, alert , and oriented  Airway & Oxygen Therapy: Patient Spontanous Breathing  Post-op Assessment: Report given to RN and Post -op Vital signs reviewed and stable  Post vital signs: Reviewed and stable  Last Vitals:  Vitals Value Taken Time  BP 144/63 09/06/22 1148  Temp    Pulse 77 09/06/22 1150  Resp 20 09/06/22 1150  SpO2 96 % 09/06/22 1150  Vitals shown include unvalidated device data.  Last Pain:  Vitals:   09/06/22 0813  PainSc: 0-No pain         Complications: No notable events documented.

## 2022-09-06 NOTE — Anesthesia Procedure Notes (Signed)
Procedure Name: LMA Insertion Date/Time: 09/06/2022 10:36 AM  Performed by: Anastasio Auerbach, CRNAPre-anesthesia Checklist: Patient identified, Emergency Drugs available, Suction available and Patient being monitored Patient Re-evaluated:Patient Re-evaluated prior to induction Oxygen Delivery Method: Circle system utilized Preoxygenation: Pre-oxygenation with 100% oxygen Induction Type: IV induction Ventilation: Mask ventilation without difficulty LMA: LMA with gastric port inserted LMA Size: 5.0 Placement Confirmation: breath sounds checked- equal and bilateral and positive ETCO2 Tube secured with: Tape

## 2022-09-06 NOTE — H&P (Signed)
Jerry Boyer is an 77 y.o. male.   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 Patient states he was scheduled for surgery prior to [place]. Patient has persistent pain with activities of daily living and is seen for evaluation and treatment.   Past Medical History:  Diagnosis Date   Anxiety    Arthritis    BPH (benign prostatic hyperplasia)    Elevated troponin 01/30/2018   a. 01/2018: Type 2 NSTEMI most consistent with demand ischemia in the setting of Urosepsis.    Hypercholesteremia    Hypertension     Past Surgical History:  Procedure Laterality Date   BACK SURGERY     lumbar disc   CATARACT EXTRACTION W/PHACO Right 02/05/2017   Procedure: CATARACT EXTRACTION PHACO AND INTRAOCULAR LENS PLACEMENT (IOC);  Surgeon: Tonny Branch, MD;  [facility];  Service: Ophthalmology;  Laterality: Right;  CDE: 7.36   CATARACT EXTRACTION W/PHACO Left 02/19/2017   Procedure: CATARACT EXTRACTION PHACO AND INTRAOCULAR LENS PLACEMENT LEFT EYE;  Surgeon: Tonny Branch, MD;  [facility]: [facility];  Service: Ophthalmology;  Laterality: Left;  CDE: 7.98   COLONOSCOPY     COLONOSCOPY N/A 03/19/2018   Procedure: COLONOSCOPY;  Surgeon: Aviva Signs, MD;  [facility];  Service: Gastroenterology;  Laterality: N/A;   COLONOSCOPY N/A 03/26/2018   Procedure: COLONOSCOPY;  Surgeon: Aviva Signs, MD;  [facility];  Service: Gastroenterology;  Laterality: N/A;   COLONOSCOPY N/A 05/17/2021   Procedure: COLONOSCOPY;  Surgeon: Aviva Signs, MD;  [facility];  Service: Gastroenterology;  Laterality: N/A;   INGUINAL HERNIA REPAIR Right 12/14/2017   Procedure: HERNIA REPAIR INGUINAL ADULT WITH MESH;  Surgeon: Aviva Signs, MD;  [facility];  Service: General;  Laterality: Right;   JOINT REPLACEMENT Left    POLYPECTOMY  03/19/2018   Procedure: POLYPECTOMY;  Surgeon: Aviva Signs,  MD;  [facility];  Service: Gastroenterology;;   POLYPECTOMY  05/17/2021   Procedure: POLYPECTOMY;  Surgeon: Aviva Signs, MD;  [facility];  Service: Gastroenterology;;   UMBILICAL HERNIA REPAIR N/A 12/14/2017   Procedure: HERNIA REPAIR UMBILICAL ADULT WITH MESH;  Surgeon: Aviva Signs, MD;  [facility];  Service: General;  Laterality: N/A;    Family History  Problem Relation Age of Onset   Hypertension Mother    Cancer Father    Colon cancer Neg Hx    Social History:  reports that he quit smoking about 21 years ago. His smoking use included cigarettes. He has a 20.00 pack-year smoking history. He has never used smokeless tobacco. He reports current alcohol use of about 21.0 standard drinks of alcohol per week. He reports that he does not use drugs.  Allergies:  Allergies  Allergen Reactions   Shrimp [Shellfish Allergy] Anaphylaxis and Rash    No medications prior to admission.    No results found for this or any previous visit (from the past 48 hour(s)). No results found.  Review of Systems  All other systems reviewed and are negative.   Height '6\' 2"'$  (1.88 m), weight 124.7 kg. Physical Exam  Patient is alert, oriented, no adenopathy, well-dressed, normal affect, normal respiratory effort. Examination patient has a good dorsalis pedis and posterior tibial pulse.  Patient has pain to palpation of the anterior tibial talar joint and has pain to palpation over the subtalar joint.  Patient has limited range of motion of  the ankle and subtalar joint.  Radiograph shows varus collapse of the talus with a varus hindfoot and degenerative changes of the posterior facet of the subtalar joint.  Bone-on-bone contact of the tibial talar joint with large anterior and posterior bone spurs.  The talonavicular joint has bony spurs dorsally but does have maintenance of joint space. Assessment/Plan 1. Traumatic arthritis of right ankle       Plan: Will plan for a  tibial calcaneal fusion.  Patient does have some arthritic changes of the talonavicular joint but is not symptomatic at this location.  Would plan for an anterior ankle and subtalar fusion.  Risks and benefits were discussed including infection neurovascular injury persistent pain need for additional surgery.  Patient states he understands and wishes to proceed at this time.  Newt Minion, MD 09/06/2022, 6:46 AM

## 2022-09-06 NOTE — Anesthesia Procedure Notes (Signed)
Anesthesia Regional Block: Popliteal block   Pre-Anesthetic Checklist: , timeout performed,  Correct Patient, Correct Site, Correct Laterality,  Correct Procedure, Correct Position, site marked,  Risks and benefits discussed,  Surgical consent,  Pre-op evaluation,  At surgeon's request and post-op pain management  Laterality: Lower and Right  Prep: chloraprep       Needles:  Injection technique: Single-shot  Needle Type: Stimiplex     Needle Length: 10cm  Needle Gauge: 21     Additional Needles:   Procedures:,,,, ultrasound used (permanent image in chart),,   Motor weakness within 5 minutes.  Narrative:  Start time: 09/06/2022 9:17 AM End time: 09/06/2022 9:22 AM Injection made incrementally with aspirations every 5 mL.  Performed by: Personally  Anesthesiologist: Nolon Nations, MD  Additional Notes: Nerve located and needle positioned with direct ultrasound guidance. Good perineural spread. Patient tolerated well.

## 2022-09-06 NOTE — Interval H&P Note (Signed)
History and Physical Interval Note:  09/06/2022 10:22 AM  Jerry Boyer  has presented today for surgery, with the diagnosis of Traumatic Arthritis Right Ankle and Foot.  The various methods of treatment have been discussed with the patient and family. After consideration of risks, benefits and other options for treatment, the patient has consented to  Procedure(s): RIGHT TIBIOCALCANEAL FUSION (Right) as a surgical intervention.  The patient's history has been reviewed, patient examined, no change in status, stable for surgery.  I have reviewed the patient's chart and labs.  Questions were answered to the patient's satisfaction.     Jerry Boyer

## 2022-09-06 NOTE — Anesthesia Postprocedure Evaluation (Signed)
Anesthesia Post Note  Patient: Jerry Boyer  Procedure(s) Performed: RIGHT TIBIOCALCANEAL FUSION (Right: Foot)     Patient location during evaluation: PACU Anesthesia Type: General Level of consciousness: sedated and patient cooperative Pain management: pain level controlled Vital Signs Assessment: post-procedure vital signs reviewed and stable Respiratory status: spontaneous breathing Cardiovascular status: stable Anesthetic complications: no   No notable events documented.  Last Vitals:  Vitals:   09/06/22 1315 09/06/22 1415  BP: 136/76   Pulse: 68   Resp: 15   Temp:    SpO2: 92% 93%    Last Pain:  Vitals:   09/06/22 1300  PainSc: 0-No pain                 Nolon Nations

## 2022-09-06 NOTE — Progress Notes (Signed)
Orthopedic Tech Progress Note Patient Details:  Jerry Boyer 1945-08-26 [id number]  PACU RN called requesting a CAM WALKER BOOT   Ortho Devices Type of Ortho Device: CAM walker Ortho Device/Splint Location: RLE Ortho Device/Splint Interventions: Ordered, Application, Adjustment   Post Interventions Patient Tolerated: Well Instructions Provided: [facility] [place] 09/06/2022, 12:22 PM

## 2022-09-07 ENCOUNTER — Encounter (HOSPITAL_COMMUNITY): Payer: Self-pay | Admitting: Orthopedic Surgery

## 2022-09-13 ENCOUNTER — Ambulatory Visit (INDEPENDENT_AMBULATORY_CARE_PROVIDER_SITE_OTHER): Payer: Medicare HMO | Admitting: Family

## 2022-09-13 ENCOUNTER — Encounter: Payer: Self-pay | Admitting: Family

## 2022-09-13 DIAGNOSIS — M12571 Traumatic arthropathy, right ankle and foot: Secondary | ICD-10-CM

## 2022-09-13 NOTE — Progress Notes (Signed)
Post-Op Visit Note   Patient: Jerry Boyer           Date of Birth: [birth year]           MRN: [id number] Visit Date: 09/13/2022 PCP: Redmond School, MD  Chief Complaint: No chief complaint on file.   HPI:  HPI The patient is a 77 year old gentleman who presents status post tibiocalcaneal fusion on the right he is 1 week out.  Endorses walking a little on his lower extremity Ortho Exam On examination of the right ankle the incision is well-approximated sutures there is scant bloody drainage no erythema no sign of infection Visit Diagnoses: No diagnosis found.  Plan: Radiographs of the ankle at follow-up.  Discussed the importance of nonweightbearing.  Begin daily Dial soap cleansing dry dressings continue CAM Walker  Follow-Up Instructions: No follow-ups on file.   Imaging: No results found.  Orders:  No orders of the defined types were placed in this encounter.  No orders of the defined types were placed in this encounter.    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   Spondylolisthesis of lumbar region 06/03/2018   Hemorrhage of colon following colonoscopy    Blood in stool 03/25/2018   Rectal bleeding    Special screening for malignant neoplasms, colon    Benign neoplasm of ascending colon    Benign neoplasm of descending colon    Rectal polyp    NSTEMI (non-ST elevated myocardial infarction) ([place]) 01/30/2018   Sepsis ([place]) 01/29/2018   Acute lower UTI 01/29/2018   HLD (hyperlipidemia) 01/29/2018   HTN (hypertension) 01/29/2018   Elevated troponin 01/29/2018   Non-recurrent unilateral inguinal hernia without obstruction or gangrene    Umbilical hernia  without obstruction and without gangrene    Past Medical History:  Diagnosis Date   Anxiety    Arthritis    BPH (benign prostatic hyperplasia)    Elevated troponin 01/30/2018   a. 01/2018: Type 2 NSTEMI most consistent with demand ischemia in the setting of Urosepsis.    Hypercholesteremia    Hypertension     Family History  Problem Relation Age of Onset   Hypertension Mother    Cancer Father    Colon cancer Neg Hx     Past Surgical History:  Procedure Laterality Date   BACK SURGERY     lumbar disc   CATARACT EXTRACTION W/PHACO Right 02/05/2017   Procedure: CATARACT EXTRACTION PHACO AND INTRAOCULAR LENS PLACEMENT (IOC);  Surgeon: Tonny Branch, MD;  [facility];  Service: Ophthalmology;  Laterality: Right;  CDE: 7.36   CATARACT EXTRACTION W/PHACO Left 02/19/2017   Procedure: CATARACT EXTRACTION PHACO AND INTRAOCULAR LENS PLACEMENT LEFT EYE;  Surgeon: Tonny Branch, MD;  [facility];  Service: Ophthalmology;  Laterality: Left;  CDE: 7.98   COLONOSCOPY     COLONOSCOPY N/A 03/19/2018   Procedure: COLONOSCOPY;  Surgeon: Aviva Signs, MD;  [facility];  Service: Gastroenterology;  Laterality: N/A;   COLONOSCOPY N/A 03/26/2018   Procedure: COLONOSCOPY;  Surgeon: Aviva Signs, MD;  [facility];  Service: Gastroenterology;  Laterality: N/A;   COLONOSCOPY N/A 05/17/2021   Procedure: COLONOSCOPY;  Surgeon: Aviva Signs, MD;  [facility];  Service: Gastroenterology;  Laterality: N/A;   FOOT ARTHRODESIS Right 09/06/2022   Procedure: RIGHT TIBIOCALCANEAL FUSION;  Surgeon: Newt Minion, MD;  Location: [facility];  Service: Orthopedics;  Laterality: Right;   INGUINAL HERNIA REPAIR Right 12/14/2017   Procedure: HERNIA REPAIR INGUINAL ADULT WITH MESH;  Surgeon: Aviva Signs, MD;  [facility];  Service: General;  Laterality: Right;   JOINT REPLACEMENT Left    POLYPECTOMY  03/19/2018   Procedure: POLYPECTOMY;  Surgeon: Aviva Signs, MD;  [facility];  Service: Gastroenterology;;   POLYPECTOMY  05/17/2021   Procedure: POLYPECTOMY;  Surgeon: Aviva Signs, MD;  [facility];  Service: Gastroenterology;;   UMBILICAL HERNIA REPAIR N/A 12/14/2017   Procedure: HERNIA REPAIR UMBILICAL ADULT WITH MESH;  Surgeon: Aviva Signs, MD;  [facility];  Service: General;  Laterality: N/A;   Social History   Occupational History   Not on file  Tobacco Use   Smoking status: Former    Packs/day: 1.00    Years: 20.00    Total pack years: 20.00    Types: Cigarettes    Quit date: 01/29/2001    Years since quitting: 21.6   Smokeless tobacco: Never  Vaping Use   Vaping Use: Never used  Substance and Sexual Activity   Alcohol use: Yes    Alcohol/week: 21.0 standard drinks of alcohol    Types: 21 Cans of beer per week    Comment: states "drinks beer about every day"   Drug use: No   Sexual activity: Yes    Birth control/protection: None

## 2022-09-25 ENCOUNTER — Ambulatory Visit (INDEPENDENT_AMBULATORY_CARE_PROVIDER_SITE_OTHER): Payer: Medicare HMO | Admitting: Orthopedic Surgery

## 2022-09-25 ENCOUNTER — Encounter: Payer: Self-pay | Admitting: Orthopedic Surgery

## 2022-09-25 ENCOUNTER — Ambulatory Visit (INDEPENDENT_AMBULATORY_CARE_PROVIDER_SITE_OTHER): Payer: Medicare HMO

## 2022-09-25 DIAGNOSIS — G8929 Other chronic pain: Secondary | ICD-10-CM

## 2022-09-25 DIAGNOSIS — M25571 Pain in right ankle and joints of right foot: Secondary | ICD-10-CM

## 2022-09-25 DIAGNOSIS — M12571 Traumatic arthropathy, right ankle and foot: Secondary | ICD-10-CM

## 2022-09-25 NOTE — Progress Notes (Signed)
Office Visit Note   Patient: Jerry Boyer           Date of Birth: May 28, 1946           MRN: [id number] Visit Date: 09/25/2022              Requested by: Redmond School, [address] [place],  [place] [address] PCP: Redmond School, MD  Chief Complaint  Patient presents with   Right Ankle - Routine Post Op    09/06/2022 right tib-cal fusion       HPI: Patient is a 77 year old gentleman who presents 3 weeks status post right tibial talar fusion.  Assessment & Plan: Visit Diagnoses:  1. Chronic pain of right ankle   2. Traumatic arthritis of right ankle     Plan: Patient still has swelling will leave the sutures in place for 1 more week.  Recommended aspirin 325 mg daily.  Follow-Up Instructions: Return in about 1 week (around 10/02/2022).   Ortho Exam  Patient is alert, oriented, no adenopathy, well-dressed, normal affect, normal respiratory effort. Patient has persistent swelling with tension on the wound with the sutures in place.  There is no cellulitis no signs of infection.  Patient had swelling before surgery and still has swelling.  Recommended elevation.  The incision is well-approximated no wound dehiscence.  Imaging: XR Ankle Complete Right  Result Date: 09/25/2022 Three-view radiographs of the right ankle shows stable tibial talar fusion with intact hardware.  No images are attached to the encounter.  Labs: Lab Results  Component Value Date   REPTSTATUS 12/22/2021 FINAL 12/21/2021   CULT  12/21/2021    NO GROWTH Performed at [facility] [address], [place], [place] [address]    LABORGA KLEBSIELLA OXYTOCA (A) 10/08/2018     Lab Results  Component Value Date   ALBUMIN 3.7 09/06/2022   ALBUMIN 3.7 03/25/2018   ALBUMIN 3.7 02/06/2018    Lab Results  Component Value Date   MG 2.0 01/30/2018   No results found for: "VD25OH"  No results found for: "PREALBUMIN"    Latest Ref Rng & Units 09/06/2022    8:00 AM 03/27/2018    4:11  AM 03/26/2018    9:49 AM  CBC EXTENDED  WBC 4.0 - 10.5 K/uL 4.0  5.8    RBC 4.22 - 5.81 MIL/uL 4.85  3.26    Hemoglobin 13.0 - 17.0 g/dL 15.4  10.3  10.7   HCT 39.0 - 52.0 % 44.6  31.1  31.9   Platelets 150 - 400 K/uL 170  163       There is no height or weight on file to calculate BMI.  Orders:  Orders Placed This Encounter  Procedures   XR Ankle Complete Right   No orders of the defined types were placed in this encounter.    Procedures: No procedures performed  Clinical Data: No additional findings.  ROS:  All other systems negative, except as noted in the HPI. Review of Systems  Objective: Vital Signs: There were no vitals taken for this visit.  Specialty Comments:  No specialty comments available.  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   Spondylolisthesis  of lumbar region 06/03/2018   Hemorrhage of colon following colonoscopy    Blood in stool 03/25/2018   Rectal bleeding    Special screening for malignant neoplasms, colon    Benign neoplasm of ascending colon    Benign neoplasm of descending colon    Rectal polyp    NSTEMI (non-ST elevated myocardial infarction) ([place]) 01/30/2018   Sepsis ([place]) 01/29/2018   Acute lower UTI 01/29/2018   HLD (hyperlipidemia) 01/29/2018   HTN (hypertension) 01/29/2018   Elevated troponin 01/29/2018   Non-recurrent unilateral inguinal hernia without obstruction or gangrene    Umbilical hernia without obstruction and without gangrene    Past Medical History:  Diagnosis Date   Anxiety    Arthritis    BPH (benign prostatic hyperplasia)    Elevated troponin 01/30/2018   a. 01/2018: Type 2 NSTEMI most consistent  with demand ischemia in the setting of Urosepsis.    Hypercholesteremia    Hypertension     Family History  Problem Relation Age of Onset   Hypertension Mother    Cancer Father    Colon cancer Neg Hx     Past Surgical History:  Procedure Laterality Date   BACK SURGERY     lumbar disc   CATARACT EXTRACTION W/PHACO Right 02/05/2017   Procedure: CATARACT EXTRACTION PHACO AND INTRAOCULAR LENS PLACEMENT (IOC);  Surgeon: Tonny Branch, MD;  [facility];  Service: Ophthalmology;  Laterality: Right;  CDE: 7.36   CATARACT EXTRACTION W/PHACO Left 02/19/2017   Procedure: CATARACT EXTRACTION PHACO AND INTRAOCULAR LENS PLACEMENT LEFT EYE;  Surgeon: Tonny Branch, MD;  [facility];  Service: Ophthalmology;  Laterality: Left;  CDE: 7.98   COLONOSCOPY     COLONOSCOPY N/A 03/19/2018   Procedure: COLONOSCOPY;  Surgeon: Aviva Signs, MD;  [facility];  Service: Gastroenterology;  Laterality: N/A;   COLONOSCOPY N/A 03/26/2018   Procedure: COLONOSCOPY;  Surgeon: Aviva Signs, MD;  [facility];  Service: Gastroenterology;  Laterality: N/A;   COLONOSCOPY N/A 05/17/2021   Procedure: COLONOSCOPY;  Surgeon: Aviva Signs, MD;  [facility];  Service: Gastroenterology;  Laterality: N/A;   FOOT ARTHRODESIS Right 09/06/2022   Procedure: RIGHT TIBIOCALCANEAL FUSION;  Surgeon: Newt Minion, MD;  Location: [place];  Service: Orthopedics;  Laterality: Right;   INGUINAL HERNIA REPAIR Right 12/14/2017   Procedure: HERNIA REPAIR INGUINAL ADULT WITH MESH;  Surgeon: Aviva Signs, MD;  [facility];  Service: General;  Laterality: Right;   JOINT REPLACEMENT Left    POLYPECTOMY  03/19/2018   Procedure: POLYPECTOMY;  Surgeon: Aviva Signs, MD;  [facility];  Service: Gastroenterology;;   POLYPECTOMY  05/17/2021   Procedure: POLYPECTOMY;  Surgeon: Aviva Signs, MD;  [facility];  Service: Gastroenterology;;   UMBILICAL HERNIA REPAIR N/A 12/14/2017   Procedure:  HERNIA REPAIR UMBILICAL ADULT WITH MESH;  Surgeon: Aviva Signs, MD;  Location[facility];  Service: General;  Laterality: N/A;   Social History   Occupational History   Not on file  Tobacco Use   Smoking status: Former    Packs/day: 1.00    Years: 20.00    Total pack years: 20.00    Types: Cigarettes    Quit date: 01/29/2001    Years since quitting: 21.6   Smokeless tobacco: Never  Vaping Use   Vaping Use: Never used  Substance and Sexual Activity   Alcohol use: Yes    Alcohol/week: 21.0 standard drinks of alcohol    Types: 21  Cans of beer per week    Comment: states "drinks beer about every day"   Drug use: No   Sexual activity: Yes    Birth control/protection: None

## 2022-10-02 ENCOUNTER — Encounter: Payer: Self-pay | Admitting: Orthopedic Surgery

## 2022-10-02 ENCOUNTER — Ambulatory Visit (INDEPENDENT_AMBULATORY_CARE_PROVIDER_SITE_OTHER): Payer: Medicare HMO | Admitting: Orthopedic Surgery

## 2022-10-02 DIAGNOSIS — M12571 Traumatic arthropathy, right ankle and foot: Secondary | ICD-10-CM

## 2022-10-02 NOTE — Progress Notes (Signed)
Office Visit Note   Patient: Jerry Boyer           Date of Birth: August 12, 1945           MRN: [id number] Visit Date: 10/02/2022              Requested by: Redmond School, [place],  [place] [address] PCP: Redmond School, MD  Chief Complaint  Patient presents with   Right Ankle - Routine Post Op    09/06/2022 right tib-cal fusion       HPI: Patient is a 77 year old gentleman who is 4 weeks status post right ankle fusion he is on aspirin 1 a day.  He is currently weightbearing as tolerated in a fracture boot and a cane.  Patient states the swelling has decreased significantly.  Assessment & Plan: Visit Diagnoses:  1. Traumatic arthritis of right ankle     Plan: Sutures were harvested continue with current fracture boot and weightbearing.  Three-view radiographs of the right ankle at follow-up.  Follow-Up Instructions: Return in about 4 weeks (around 10/30/2022).   Ortho Exam  Patient is alert, oriented, no adenopathy, well-dressed, normal affect, normal respiratory effort. Examination the swelling has decreased but there is still some swelling in the right ankle.  The incision is well-healed sutures are harvested no redness or cellulitis.  Imaging: No results found. No images are attached to the encounter.  Labs: Lab Results  Component Value Date   REPTSTATUS 12/22/2021 FINAL 12/21/2021   CULT  12/21/2021    NO GROWTH Performed at [facility] [address], [place], [place] [address]    LABORGA KLEBSIELLA OXYTOCA (A) 10/08/2018     Lab Results  Component Value Date   ALBUMIN 3.7 09/06/2022   ALBUMIN 3.7 03/25/2018   ALBUMIN 3.7 02/06/2018    Lab Results  Component Value Date   MG 2.0 01/30/2018   No results found for: "VD25OH"  No results found for: "PREALBUMIN"    Latest Ref Rng & Units 09/06/2022    8:00 AM 03/27/2018    4:11 AM 03/26/2018    9:49 AM  CBC EXTENDED  WBC 4.0 - 10.5 K/uL 4.0  5.8    RBC 4.22 - 5.81  MIL/uL 4.85  3.26    Hemoglobin 13.0 - 17.0 g/dL 15.4  10.3  10.7   HCT 39.0 - 52.0 % 44.6  31.1  31.9   Platelets 150 - 400 K/uL 170  163       There is no height or weight on file to calculate BMI.  Orders:  No orders of the defined types were placed in this encounter.  No orders of the defined types were placed in this encounter.    Procedures: No procedures performed  Clinical Data: No additional findings.  ROS:  All other systems negative, except as noted in the HPI. Review of Systems  Objective: Vital Signs: There were no vitals taken for this visit.  Specialty Comments:  No specialty comments available.  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   Spondylolisthesis of lumbar region 06/03/2018   Hemorrhage of colon following colonoscopy    Blood in stool 03/25/2018  Rectal bleeding    Special screening for malignant neoplasms, colon    Benign neoplasm of ascending colon    Benign neoplasm of descending colon    Rectal polyp    NSTEMI (non-ST elevated myocardial infarction) ([place]) 01/30/2018   Sepsis ([place]) 01/29/2018   Acute lower UTI 01/29/2018   HLD (hyperlipidemia) 01/29/2018   HTN (hypertension) 01/29/2018   Elevated troponin 01/29/2018   Non-recurrent unilateral inguinal hernia without obstruction or gangrene    Umbilical hernia without obstruction and without gangrene    Past Medical History:  Diagnosis Date   Anxiety    Arthritis    BPH (benign prostatic hyperplasia)    Elevated troponin 01/30/2018   a. 01/2018: Type 2 NSTEMI most consistent with demand ischemia in the setting of Urosepsis.    Hypercholesteremia    Hypertension      Family History  Problem Relation Age of Onset   Hypertension Mother    Cancer Father    Colon cancer Neg Hx     Past Surgical History:  Procedure Laterality Date   BACK SURGERY     lumbar disc   CATARACT EXTRACTION W/PHACO Right 02/05/2017   Procedure: CATARACT EXTRACTION PHACO AND INTRAOCULAR LENS PLACEMENT (IOC);  Surgeon: Tonny Branch, MD;  Location: [facility];  Service: Ophthalmology;  Laterality: Right;  CDE: 7.36   CATARACT EXTRACTION W/PHACO Left 02/19/2017   Procedure: CATARACT EXTRACTION PHACO AND INTRAOCULAR LENS PLACEMENT LEFT EYE;  Surgeon: Tonny Branch, MD;  Location: [facility];  Service: Ophthalmology;  Laterality: Left;  CDE: 7.98   COLONOSCOPY     COLONOSCOPY N/A 03/19/2018   Procedure: COLONOSCOPY;  Surgeon: Aviva Signs, MD;  [facility];  Service: Gastroenterology;  Laterality: N/A;   COLONOSCOPY N/A 03/26/2018   Procedure: COLONOSCOPY;  Surgeon: Aviva Signs, MD;  [facility];  Service: Gastroenterology;  Laterality: N/A;   COLONOSCOPY N/A 05/17/2021   Procedure: COLONOSCOPY;  Surgeon: Aviva Signs, MD;  [facility];  Service: Gastroenterology;  Laterality: N/A;   FOOT ARTHRODESIS Right 09/06/2022   Procedure: RIGHT TIBIOCALCANEAL FUSION;  Surgeon: Newt Minion, MD;  Location: [facility];  Service: Orthopedics;  Laterality: Right;   INGUINAL HERNIA REPAIR Right 12/14/2017   Procedure: HERNIA REPAIR INGUINAL ADULT WITH MESH;  Surgeon: Aviva Signs, MD;  Location: [facility];  Service: General;  Laterality: Right;   JOINT REPLACEMENT Left    POLYPECTOMY  03/19/2018   Procedure: POLYPECTOMY;  Surgeon: Aviva Signs, MD;  [facility];  Service: Gastroenterology;;   POLYPECTOMY  05/17/2021   Procedure: POLYPECTOMY;  Surgeon: Aviva Signs, MD;  [facility];  Service: Gastroenterology;;   UMBILICAL HERNIA REPAIR N/A 12/14/2017   Procedure: HERNIA REPAIR UMBILICAL ADULT WITH MESH;  Surgeon: Aviva Signs, MD;  Location: [facility];   Service: General;  Laterality: N/A;   Social History   Occupational History   Not on file  Tobacco Use   Smoking status: Former    Packs/day: 1.00    Years: 20.00    Total pack years: 20.00    Types: Cigarettes    Quit date: 01/29/2001    Years since quitting: 21.6   Smokeless tobacco: Never  Vaping Use   Vaping Use: Never used  Substance and Sexual Activity   Alcohol use: Yes    Alcohol/week: 21.0 standard drinks of alcohol    Types: 21 Cans of beer per week    Comment: states "drinks beer about every day"   Drug use: No  Sexual activity: Yes    Birth control/protection: None

## 2022-10-16 DIAGNOSIS — Z6837 Body mass index (BMI) 37.0-37.9, adult: Secondary | ICD-10-CM | POA: Diagnosis not present

## 2022-10-16 DIAGNOSIS — I10 Essential (primary) hypertension: Secondary | ICD-10-CM | POA: Diagnosis not present

## 2022-10-16 DIAGNOSIS — D518 Other vitamin B12 deficiency anemias: Secondary | ICD-10-CM | POA: Diagnosis not present

## 2022-10-16 DIAGNOSIS — N401 Enlarged prostate with lower urinary tract symptoms: Secondary | ICD-10-CM | POA: Diagnosis not present

## 2022-10-16 DIAGNOSIS — E782 Mixed hyperlipidemia: Secondary | ICD-10-CM | POA: Diagnosis not present

## 2022-10-16 DIAGNOSIS — G9332 Myalgic encephalomyelitis/chronic fatigue syndrome: Secondary | ICD-10-CM | POA: Diagnosis not present

## 2022-10-16 DIAGNOSIS — N411 Chronic prostatitis: Secondary | ICD-10-CM | POA: Diagnosis not present

## 2022-10-16 DIAGNOSIS — E559 Vitamin D deficiency, unspecified: Secondary | ICD-10-CM | POA: Diagnosis not present

## 2022-10-16 DIAGNOSIS — Z1331 Encounter for screening for depression: Secondary | ICD-10-CM | POA: Diagnosis not present

## 2022-10-16 DIAGNOSIS — Z125 Encounter for screening for malignant neoplasm of prostate: Secondary | ICD-10-CM | POA: Diagnosis not present

## 2022-10-16 DIAGNOSIS — Z0001 Encounter for general adult medical examination with abnormal findings: Secondary | ICD-10-CM | POA: Diagnosis not present

## 2022-10-16 DIAGNOSIS — M1991 Primary osteoarthritis, unspecified site: Secondary | ICD-10-CM | POA: Diagnosis not present

## 2022-11-06 ENCOUNTER — Ambulatory Visit (INDEPENDENT_AMBULATORY_CARE_PROVIDER_SITE_OTHER): Payer: Medicare HMO | Admitting: Orthopedic Surgery

## 2022-11-06 ENCOUNTER — Other Ambulatory Visit (INDEPENDENT_AMBULATORY_CARE_PROVIDER_SITE_OTHER): Payer: Medicare HMO

## 2022-11-06 DIAGNOSIS — M12571 Traumatic arthropathy, right ankle and foot: Secondary | ICD-10-CM | POA: Diagnosis not present

## 2022-11-13 ENCOUNTER — Ambulatory Visit: Payer: Medicare HMO | Admitting: Orthopedic Surgery

## 2022-11-13 ENCOUNTER — Other Ambulatory Visit (INDEPENDENT_AMBULATORY_CARE_PROVIDER_SITE_OTHER): Payer: Medicare HMO

## 2022-11-13 DIAGNOSIS — M25561 Pain in right knee: Secondary | ICD-10-CM

## 2022-11-13 DIAGNOSIS — M1711 Unilateral primary osteoarthritis, right knee: Secondary | ICD-10-CM

## 2022-11-13 DIAGNOSIS — G8929 Other chronic pain: Secondary | ICD-10-CM

## 2022-11-14 ENCOUNTER — Encounter: Payer: Self-pay | Admitting: Orthopedic Surgery

## 2022-11-14 DIAGNOSIS — G8929 Other chronic pain: Secondary | ICD-10-CM | POA: Diagnosis not present

## 2022-11-14 DIAGNOSIS — M1711 Unilateral primary osteoarthritis, right knee: Secondary | ICD-10-CM

## 2022-11-14 DIAGNOSIS — M25561 Pain in right knee: Secondary | ICD-10-CM | POA: Diagnosis not present

## 2022-11-14 MED ORDER — LIDOCAINE HCL (PF) 1 % IJ SOLN
5.0000 mL | INTRAMUSCULAR | Status: AC | PRN
  Administered 2022-11-14: 5 mL

## 2022-11-14 MED ORDER — METHYLPREDNISOLONE ACETATE 40 MG/ML IJ SUSP
40.0000 mg | INTRAMUSCULAR | Status: AC | PRN
  Administered 2022-11-14: 40 mg via INTRA_ARTICULAR

## 2022-11-14 NOTE — Progress Notes (Signed)
Office Visit Note   Patient: Jerry Boyer           Date of Birth: [birth year]           MRN: [id number] Visit Date: 11/13/2022              Requested by: Elfredia Nevins, MD [address] [place],  [place] [address] PCP: Elfredia Nevins, MD  Chief Complaint  Patient presents with   Right Ankle - Routine Post Op    09/06/2022 right tib-cal fusion      HPI: Patient is a 77 year old gentleman with osteoarthritis of the right knee status post right tibial calcaneal fusion in February.  Patient is status post a left total knee arthroplasty and has been having increasing pain with activities of daily living in the right knee.  Assessment & Plan: Visit Diagnoses:  1. Chronic pain of right knee   2. Unilateral primary osteoarthritis, right knee     Plan: Right knee was injected he tolerated this well reevaluate in 4 weeks.  Follow-Up Instructions: No follow-ups on file.   Ortho Exam  Patient is alert, oriented, no adenopathy, well-dressed, normal affect, normal respiratory effort. Examination patient has swelling around the right ankle but there is no cellulitis no ulcers.  Recommended compression.  Right knee patient has varus alignment antalgic gait crepitation with range of motion collaterals and cruciates are stable there is tenderness to palpation the patellofemoral joint as well as medial lateral joint line.  Imaging: XR Knee 1-2 Views Right  Result Date: 11/14/2022 2 view radiographs of the right knee shows bone-on-bone contact medial joint line with varus alignment.  There is calcification of the popliteal artery.  No images are attached to the encounter.  Labs: Lab Results  Component Value Date   REPTSTATUS 12/22/2021 FINAL 12/21/2021   CULT  12/21/2021    NO GROWTH Performed at [facility], [address], [place], [place] [address]    LABORGA KLEBSIELLA OXYTOCA (A) 10/08/2018     Lab Results  Component Value Date   ALBUMIN 3.7 09/06/2022    ALBUMIN 3.7 03/25/2018   ALBUMIN 3.7 02/06/2018    Lab Results  Component Value Date   MG 2.0 01/30/2018   No results found for: "VD25OH"  No results found for: "PREALBUMIN"    Latest Ref Rng & Units 09/06/2022    8:00 AM 03/27/2018    4:11 AM 03/26/2018    9:49 AM  CBC EXTENDED  WBC 4.0 - 10.5 K/uL 4.0  5.8    RBC 4.22 - 5.81 MIL/uL 4.85  3.26    Hemoglobin 13.0 - 17.0 g/dL 82.9  56.2  13.0   HCT 39.0 - 52.0 % 44.6  31.1  31.9   Platelets 150 - 400 K/uL 170  163       There is no height or weight on file to calculate BMI.  Orders:  Orders Placed This Encounter  Procedures   XR Knee 1-2 Views Right   No orders of the defined types were placed in this encounter.    Procedures: Large Joint Inj: R knee on 11/14/2022 11:55 AM Indications: pain and diagnostic evaluation Details: 22 G 1.5 in needle, anteromedial approach  Arthrogram: No  Medications: 5 mL lidocaine (PF) 1 %; 40 mg methylPREDNISolone acetate 40 MG/ML Outcome: tolerated well, no immediate complications Procedure, treatment alternatives, risks and benefits explained, specific risks discussed. Consent was given by the patient. Immediately prior to procedure a time out was called to  verify the correct patient, procedure, equipment, support staff and site/side marked as required. Patient was prepped and draped in the usual sterile fashion.      Clinical Data: No additional findings.  ROS:  All other systems negative, except as noted in the HPI. Review of Systems  Objective: Vital Signs: There were no vitals taken for this visit.  Specialty Comments:  No specialty comments available.  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   Spondylolisthesis of lumbar region 06/03/2018   Hemorrhage of colon following colonoscopy    Blood in stool 03/25/2018   Rectal bleeding    Special screening for malignant neoplasms, colon    Benign neoplasm of ascending colon    Benign neoplasm of descending colon    Rectal polyp    NSTEMI (non-ST elevated myocardial infarction) 01/30/2018   Sepsis 01/29/2018   Acute lower UTI 01/29/2018   HLD (hyperlipidemia) 01/29/2018   HTN (hypertension) 01/29/2018   Elevated troponin 01/29/2018   Non-recurrent unilateral inguinal hernia without obstruction or gangrene    Umbilical hernia without obstruction and without gangrene    Past Medical History:  Diagnosis Date   Anxiety    Arthritis    BPH (benign prostatic hyperplasia)    Elevated troponin 01/30/2018   a. 01/2018: Type 2 NSTEMI most consistent with demand ischemia in the setting of Urosepsis.    Hypercholesteremia    Hypertension     Family History  Problem Relation Age of Onset   Hypertension Mother    Cancer Father    Colon cancer Neg Hx     Past Surgical History:  Procedure Laterality Date   BACK SURGERY     lumbar disc   CATARACT EXTRACTION W/PHACO Right 02/05/2017   Procedure: CATARACT EXTRACTION PHACO AND INTRAOCULAR LENS PLACEMENT (IOC);  Surgeon: Gemma Payor, MD;  [facility];  Service: Ophthalmology;  Laterality: Right;  CDE: 7.36   CATARACT EXTRACTION W/PHACO Left 02/19/2017   Procedure: CATARACT EXTRACTION PHACO AND INTRAOCULAR LENS PLACEMENT LEFT EYE;  Surgeon: Gemma Payor, MD;  [facility];  Service: Ophthalmology;  Laterality: Left;  CDE: 7.98   COLONOSCOPY     COLONOSCOPY N/A 03/19/2018   Procedure: COLONOSCOPY;  Surgeon: Franky Macho, MD;  [facility];  Service: Gastroenterology;  Laterality: N/A;   COLONOSCOPY N/A 03/26/2018   Procedure: COLONOSCOPY;  Surgeon: Franky Macho, MD;  [facility];  Service: Gastroenterology;  Laterality: N/A;    COLONOSCOPY N/A 05/17/2021   Procedure: COLONOSCOPY;  Surgeon: Franky Macho, MD;  [facility];  Service: Gastroenterology;  Laterality: N/A;   FOOT ARTHRODESIS Right 09/06/2022   Procedure: RIGHT TIBIOCALCANEAL FUSION;  Surgeon: Nadara Mustard, MD;  Location: [facility];  Service: Orthopedics;  Laterality: Right;   INGUINAL HERNIA REPAIR Right 12/14/2017   Procedure: HERNIA REPAIR INGUINAL ADULT WITH MESH;  Surgeon: Franky Macho, MD;  Location: [facility];  Service: General;  Laterality: Right;   JOINT REPLACEMENT Left    POLYPECTOMY  03/19/2018   Procedure: POLYPECTOMY;  Surgeon: Franky Macho, MD;  [facility];  Service: Gastroenterology;;   POLYPECTOMY  05/17/2021   Procedure: POLYPECTOMY;  Surgeon: Franky Macho, MD;  [facility];  Service: Gastroenterology;;   UMBILICAL HERNIA REPAIR N/A 12/14/2017   Procedure: HERNIA REPAIR UMBILICAL ADULT WITH MESH;  Surgeon: Franky Macho, MD;  Location: [facility];  Service: General;  Laterality: N/A;   Social History   Occupational History   Not on file  Tobacco Use   Smoking status: Former    Packs/day: 1.00    Years: 20.00    Additional pack years: 0.00    Total pack years: 20.00    Types: Cigarettes    Quit date: 01/29/2001    Years since quitting: 21.8   Smokeless tobacco: Never  Vaping Use   Vaping Use: Never used  Substance and Sexual Activity   Alcohol use: Yes    Alcohol/week: 21.0 standard drinks of alcohol    Types: 21 Cans of beer per week    Comment: states "drinks beer about every day"   Drug use: No   Sexual activity: Yes    Birth control/protection: None

## 2022-11-17 ENCOUNTER — Encounter: Payer: Self-pay | Admitting: Orthopedic Surgery

## 2022-11-17 NOTE — Progress Notes (Signed)
Office Visit Note   Patient: Jerry Boyer           Date of Birth: February 11, 1946           MRN: [id number] Visit Date: 11/06/2022              Requested by: Elfredia Nevins, MD [address] [place],  [place] [address] PCP: Elfredia Nevins, MD  Chief Complaint  Patient presents with   Right Ankle - Routine Post Op    09/06/2022 right tib-cal fusion       HPI: Patient is a 77 year old gentleman who is 2 months status post right ankle fusion.  Patient is currently ambulating with a slip on shoe and a cane.  Assessment & Plan: Visit Diagnoses:  1. Traumatic arthritis of right ankle     Plan: Will apply Dynaflex wrap today for the venous insufficiency swelling.  Anticipate we can advance to compression sock once the swelling has resolved.  Follow-Up Instructions: Return in about 1 week (around 11/13/2022).   Ortho Exam  Patient is alert, oriented, no adenopathy, well-dressed, normal affect, normal respiratory effort. Examination the fusion is stable patient has increased venous stasis swelling in the right lower extremity.  Dynaflex wrap is applied.  Imaging: No results found. No images are attached to the encounter.  Labs: Lab Results  Component Value Date   REPTSTATUS 12/22/2021 FINAL 12/21/2021   CULT  12/21/2021    NO GROWTH Performed at [facility], [address], [place], [place] [address]    LABORGA KLEBSIELLA OXYTOCA (A) 10/08/2018     Lab Results  Component Value Date   ALBUMIN 3.7 09/06/2022   ALBUMIN 3.7 03/25/2018   ALBUMIN 3.7 02/06/2018    Lab Results  Component Value Date   MG 2.0 01/30/2018   No results found for: "VD25OH"  No results found for: "PREALBUMIN"    Latest Ref Rng & Units 09/06/2022    8:00 AM 03/27/2018    4:11 AM 03/26/2018    9:49 AM  CBC EXTENDED  WBC 4.0 - 10.5 K/uL 4.0  5.8    RBC 4.22 - 5.81 MIL/uL 4.85  3.26    Hemoglobin 13.0 - 17.0 g/dL 86.5  78.4  69.6   HCT 39.0 - 52.0 % 44.6  31.1  31.9   Platelets  150 - 400 K/uL 170  163       There is no height or weight on file to calculate BMI.  Orders:  Orders Placed This Encounter  Procedures   XR Ankle Complete Right   No orders of the defined types were placed in this encounter.    Procedures: No procedures performed  Clinical Data: No additional findings.  ROS:  All other systems negative, except as noted in the HPI. Review of Systems  Objective: Vital Signs: There were no vitals taken for this visit.  Specialty Comments:  No specialty comments available.  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   Spondylolisthesis of lumbar region 06/03/2018   Hemorrhage of colon following colonoscopy    Blood in stool 03/25/2018   Rectal bleeding    Special screening for malignant neoplasms, colon    Benign neoplasm  of ascending colon    Benign neoplasm of descending colon    Rectal polyp    NSTEMI (non-ST elevated myocardial infarction) 01/30/2018   Sepsis 01/29/2018   Acute lower UTI 01/29/2018   HLD (hyperlipidemia) 01/29/2018   HTN (hypertension) 01/29/2018   Elevated troponin 01/29/2018   Non-recurrent unilateral inguinal hernia without obstruction or gangrene    Umbilical hernia without obstruction and without gangrene    Past Medical History:  Diagnosis Date   Anxiety    Arthritis    BPH (benign prostatic hyperplasia)    Elevated troponin 01/30/2018   a. 01/2018: Type 2 NSTEMI most consistent with demand ischemia in the setting of Urosepsis.    Hypercholesteremia    Hypertension     Family History  Problem Relation Age of Onset   Hypertension Mother    Cancer Father    Colon cancer Neg Hx     Past  Surgical History:  Procedure Laterality Date   BACK SURGERY     lumbar disc   CATARACT EXTRACTION W/PHACO Right 02/05/2017   Procedure: CATARACT EXTRACTION PHACO AND INTRAOCULAR LENS PLACEMENT (IOC);  Surgeon: Gemma Payor, MD;  [facility];  Service: Ophthalmology;  Laterality: Right;  CDE: 7.36   CATARACT EXTRACTION W/PHACO Left 02/19/2017   Procedure: CATARACT EXTRACTION PHACO AND INTRAOCULAR LENS PLACEMENT LEFT EYE;  Surgeon: Gemma Payor, MD;  Location: [facility];  Service: Ophthalmology;  Laterality: Left;  CDE: 7.98   COLONOSCOPY     COLONOSCOPY N/A 03/19/2018   Procedure: COLONOSCOPY;  Surgeon: Franky Macho, MD;  [facility];  Service: Gastroenterology;  Laterality: N/A;   COLONOSCOPY N/A 03/26/2018   Procedure: COLONOSCOPY;  Surgeon: Franky Macho, MD;  [facility];  Service: Gastroenterology;  Laterality: N/A;   COLONOSCOPY N/A 05/17/2021   Procedure: COLONOSCOPY;  Surgeon: Franky Macho, MD;  [facility];  Service: Gastroenterology;  Laterality: N/A;   FOOT ARTHRODESIS Right 09/06/2022   Procedure: RIGHT TIBIOCALCANEAL FUSION;  Surgeon: Nadara Mustard, MD;  Location: [facility];  Service: Orthopedics;  Laterality: Right;   INGUINAL HERNIA REPAIR Right 12/14/2017   Procedure: HERNIA REPAIR INGUINAL ADULT WITH MESH;  Surgeon: Franky Macho, MD;  Location[facility];  Service: General;  Laterality: Right;   JOINT REPLACEMENT Left    POLYPECTOMY  03/19/2018   Procedure: POLYPECTOMY;  Surgeon: Franky Macho, MD;  [facility];  Service: Gastroenterology;;   POLYPECTOMY  05/17/2021   Procedure: POLYPECTOMY;  Surgeon: Franky Macho, MD;  [facility];  Service: Gastroenterology;;   UMBILICAL HERNIA REPAIR N/A 12/14/2017   Procedure: HERNIA REPAIR UMBILICAL ADULT WITH MESH;  Surgeon: Franky Macho, MD;  Location: [facility];  Service: General;  Laterality: N/A;   Social History   Occupational History   Not on file  Tobacco Use   Smoking status:  Former    Packs/day: 1.00    Years: 20.00    Additional pack years: 0.00    Total pack years: 20.00    Types: Cigarettes    Quit date: 01/29/2001    Years since quitting: 21.8   Smokeless tobacco: Never  Vaping Use   Vaping Use: Never used  Substance and Sexual Activity   Alcohol use: Yes    Alcohol/week: 21.0 standard drinks of alcohol    Types: 21 Cans of beer per week    Comment: states "drinks beer about every day"   Drug use: No   Sexual activity: Yes    Birth control/protection: None

## 2022-12-07 DIAGNOSIS — H52223 Regular astigmatism, bilateral: Secondary | ICD-10-CM | POA: Diagnosis not present

## 2022-12-13 ENCOUNTER — Ambulatory Visit: Payer: Medicare HMO | Admitting: Orthopaedic Surgery

## 2022-12-13 ENCOUNTER — Encounter: Payer: Self-pay | Admitting: Orthopaedic Surgery

## 2022-12-13 DIAGNOSIS — M1711 Unilateral primary osteoarthritis, right knee: Secondary | ICD-10-CM | POA: Diagnosis not present

## 2022-12-13 NOTE — Progress Notes (Addendum)
HPI: [name] comes in today for follow-up of his right knee.  He underwent a right knee cortisone injection by Dr. Lajoyce Boyer on 11/13/2022.  He states that it gave him about 50% relief for approximately 2 weeks.  He has known arthritis of the right knee.  Radiographs are reviewed from 11/13/2022 and show bone-on-bone medial compartment.  He has moderate patellofemoral arthritic changes.  Lateral compartment with minimal periarticular spurring but joint space well-maintained.  He has had no new injury to the knee.  He states his pain is moderate to severe in the right knee.  Worse on damp days.  He does use a cane to ambulate.  He is currently recovering from a right tibial calcaneal fusion which was done in February of this year.  Still has swelling right leg.  History of left total knee arthroplasty by Dr. Cleophas Boyer in August 2010.  States he did well with the left knee has no pain in the left knee at this point in time.  Review of systems: See HPI otherwise negative or noncontributory.  Physical exam: General pleasant male in no acute distress. Psych: Alert and oriented x 3 : Good range of motion of both knees.  Right knee with patellofemoral crepitus.  No instability valgus /varus stressing of either knee.  Right knee no abnormal warmth erythema or effusion.  Tenderness along medial joint line of the right knee.  Impression: Right knee osteoarthritis  Plan: Patient did ask about supplemental injection for his knee.  He just does not feel he is up to undergoing a right total knee arthroplasty given his recent tibial calcaneal fusion just in February.  Will try to gain approval for right knee viscosupplementation.  He has no upcoming surgery on that knee in the next 6 months.  He has failed other conservative treatment which is included time, offloading with a cane and corticosteroid injections.  Questions were encouraged and answered at length.

## 2023-01-18 ENCOUNTER — Telehealth: Payer: Self-pay | Admitting: Orthopaedic Surgery

## 2023-01-18 NOTE — Telephone Encounter (Signed)
VOB submitted for Euflexxa, right knee  

## 2023-01-18 NOTE — Telephone Encounter (Signed)
Patient called needing to know if the gel injection was approved? The number to contact patient is [phone]

## 2023-01-29 ENCOUNTER — Telehealth: Payer: Self-pay

## 2023-01-29 DIAGNOSIS — M1711 Unilateral primary osteoarthritis, right knee: Secondary | ICD-10-CM

## 2023-01-29 NOTE — Telephone Encounter (Signed)
Tried to call patient to schedule for gel injection, but no answer and no VM setup.   Patient will need 3 appointments with Dr. Magnus Ivan or Bronson Curb if he calls back.  See referrals tab

## 2023-02-15 ENCOUNTER — Ambulatory Visit (INDEPENDENT_AMBULATORY_CARE_PROVIDER_SITE_OTHER): Payer: Medicare HMO | Admitting: Physician Assistant

## 2023-02-15 ENCOUNTER — Encounter: Payer: Self-pay | Admitting: Physician Assistant

## 2023-02-15 DIAGNOSIS — M1711 Unilateral primary osteoarthritis, right knee: Secondary | ICD-10-CM

## 2023-02-15 MED ORDER — SODIUM HYALURONATE (VISCOSUP) 20 MG/2ML IX SOSY
20.0000 mg | PREFILLED_SYRINGE | INTRA_ARTICULAR | Status: AC | PRN
  Administered 2023-02-15: 20 mg via INTRA_ARTICULAR

## 2023-02-15 NOTE — Progress Notes (Signed)
   Procedure Note  Patient: Jerry Boyer             Date of Birth: 23-Jun-1946           MRN: [id number]             Visit Date: 02/15/2023 HPI: Mr. Stolar comes in today for his first Euflexxa injection.  He denies any recent injury to the right knee.  He has tried cortisone injections with minimal relief.  Radiographs again show bone-on-bone medial compartment of his right knee.  He also has moderate patellofemoral arthritis in the right knee.  No scheduled surgery on the right knee in the next 6 months.  History of left total knee arthroplasty is doing well.  Physical exam: Right knee no abnormal warmth erythema or effusion.  Overall good range of motion.  Procedures: Visit Diagnoses:  1. Unilateral primary osteoarthritis, right knee     Large Joint Inj: R knee on 02/15/2023 4:16 PM Indications: pain Details: 22 G 1.5 in needle, anterolateral approach  Arthrogram: No  Medications: 20 mg Sodium Hyaluronate (Viscosup) 20 MG/2ML Outcome: tolerated well, no immediate complications Procedure, treatment alternatives, risks and benefits explained, specific risks discussed. Consent was given by the patient. Immediately prior to procedure a time out was called to verify the correct patient, procedure, equipment, support staff and site/side marked as required. Patient was prepped and draped in the usual sterile fashion.     Plan: Follow-up in 1 week for second Euflexxa injection.  Questions were encouraged

## 2023-02-22 ENCOUNTER — Ambulatory Visit: Payer: Medicare HMO | Admitting: Physician Assistant

## 2023-02-22 ENCOUNTER — Encounter: Payer: Self-pay | Admitting: Physician Assistant

## 2023-02-22 DIAGNOSIS — M1711 Unilateral primary osteoarthritis, right knee: Secondary | ICD-10-CM | POA: Diagnosis not present

## 2023-02-22 MED ORDER — SODIUM HYALURONATE (VISCOSUP) 20 MG/2ML IX SOSY
20.0000 mg | PREFILLED_SYRINGE | INTRA_ARTICULAR | Status: AC | PRN
  Administered 2023-02-22: 20 mg via INTRA_ARTICULAR

## 2023-02-22 NOTE — Progress Notes (Signed)
   Procedure Note  Patient: Jerry Boyer             Date of Birth: 06-15-1946           MRN: [id number]             Visit Date: 02/22/2023   HPI: Jerry Boyer returns today for his second Euflexxa injection.  He had no adverse effects to the injection.  Physical exam: Right knee good range of motion.  No abnormal warmth erythema or effusion. Procedures: Visit Diagnoses:  1. Unilateral primary osteoarthritis, right knee     Large Joint Inj on 02/22/2023 2:50 PM Indications: pain Details: 22 G 1.5 in needle, anterolateral approach  Arthrogram: No  Medications: 20 mg Sodium Hyaluronate (Viscosup) 20 MG/2ML Outcome: tolerated well, no immediate complications Procedure, treatment alternatives, risks and benefits explained, specific risks discussed. Consent was given by the patient. Immediately prior to procedure a time out was called to verify the correct patient, procedure, equipment, support staff and site/side marked as required. Patient was prepped and draped in the usual sterile fashion.      Plan: Will see him back in 1 week for his third and final Euflexxa injection.  He tolerated the injection well today.

## 2023-03-01 ENCOUNTER — Encounter: Payer: Self-pay | Admitting: Physician Assistant

## 2023-03-01 ENCOUNTER — Ambulatory Visit: Payer: Medicare HMO | Admitting: Physician Assistant

## 2023-03-01 DIAGNOSIS — M1711 Unilateral primary osteoarthritis, right knee: Secondary | ICD-10-CM

## 2023-03-01 MED ORDER — SODIUM HYALURONATE (VISCOSUP) 20 MG/2ML IX SOSY
20.0000 mg | PREFILLED_SYRINGE | INTRA_ARTICULAR | Status: AC | PRN
  Administered 2023-03-01: 20 mg via INTRA_ARTICULAR

## 2023-03-01 NOTE — Progress Notes (Signed)
   Procedure Note  Patient: Jerry Boyer             Date of Birth: 1945/10/05           MRN: [id number]             Visit Date: 03/01/2023 HPI: [name] returns today for his third Euflexxa injection right knee.  He states he had no adverse effects.  Really cannot tell a difference as of yet in regards to right knee pain.  No new injuries.  Right knee: No abnormal warmth erythema or effusion.  Good range of motion of the knee. Procedures: Visit Diagnoses:  1. Unilateral primary osteoarthritis, right knee     Large Joint Inj: R knee on 03/01/2023 4:45 PM Indications: pain Details: 22 G 1.5 in needle, anterolateral approach  Arthrogram: No  Medications: 20 mg Sodium Hyaluronate (Viscosup) 20 MG/2ML Outcome: tolerated well, no immediate complications Procedure, treatment alternatives, risks and benefits explained, specific risks discussed. Consent was given by the patient. Immediately prior to procedure a time out was called to verify the correct patient, procedure, equipment, support staff and site/side marked as required. Patient was prepped and draped in the usual sterile fashion.     Plan: Follow-up with [place] as needed knows to wait at least 6 months between supplemental injections in 3 months between cortisone injections.  Questions were encouraged and answered.

## 2023-06-04 ENCOUNTER — Encounter: Payer: Self-pay | Admitting: Physician Assistant

## 2023-06-04 ENCOUNTER — Ambulatory Visit: Payer: Medicare HMO | Admitting: Physician Assistant

## 2023-06-04 DIAGNOSIS — M722 Plantar fascial fibromatosis: Secondary | ICD-10-CM | POA: Diagnosis not present

## 2023-06-04 DIAGNOSIS — M1711 Unilateral primary osteoarthritis, right knee: Secondary | ICD-10-CM | POA: Diagnosis not present

## 2023-06-04 MED ORDER — LIDOCAINE HCL 1 % IJ SOLN
1.0000 mL | INTRAMUSCULAR | Status: AC | PRN
  Administered 2023-06-04: 1 mL

## 2023-06-04 MED ORDER — METHYLPREDNISOLONE ACETATE 40 MG/ML IJ SUSP
40.0000 mg | INTRAMUSCULAR | Status: AC | PRN
  Administered 2023-06-04: 40 mg via INTRAMUSCULAR

## 2023-06-04 MED ORDER — METHYLPREDNISOLONE ACETATE 40 MG/ML IJ SUSP
40.0000 mg | INTRAMUSCULAR | Status: AC | PRN
  Administered 2023-06-04: 40 mg via INTRA_ARTICULAR

## 2023-06-04 MED ORDER — LIDOCAINE HCL 1 % IJ SOLN
3.0000 mL | INTRAMUSCULAR | Status: AC | PRN
  Administered 2023-06-04: 3 mL

## 2023-06-04 NOTE — Progress Notes (Signed)
HPI: Jerry Boyer comes in today requesting injections of his right knee and also his ankle.  He last had flex injection in the right knee on 03/01/2023.  States the injection helped some.  Pain is deep within the joint.  No known acute injury.  Pain is worse with getting up up described as sharp pain in the knee.  He is also having pain points to the medial aspect of the foot and ankle whenever standing after sitting for prolonged period of time of her standing morning.  Status post right tibial calcaneal fusion by Dr. Lajoyce Corners 09/06/2022.  States the ankle is no longer bothering him.  He has been taking Tylenol.   Review of systems: Negative for fevers chills.  Nondiabetic.  Physical exam: General: No acute distress.  Mood and affect appropriate. Right knee good range of motion.  No abnormal warmth erythema or effusion. Right foot and ankle: Well-healed surgical incision anterior right ankle.  No ankle motion secondary to fusion.  Nontender over the peroneal tendons posterior tibial tendon.  Maximal tenderness over the medial tubercle of the calcaneus.  Impression: Tricompartmental right knee osteoarthritis Plantar fasciitis right foot  Plan: He understands he needs to wait at least 3 months between cortisone injections right knee.  He will obtain orthotics for his shoes with a arch support.  If he fails conservative treatment with cortisone injection which was given today at the medial tubercle of the calcaneus we will send him for possible shockwave therapy to the medial tubercle of the calcaneus with Dr. Shon Baton.     Procedure Note  Patient: Jerry Boyer             Date of Birth: Jun 30, 1946           MRN: [id number]             Visit Date: 06/04/2023  Procedures: Visit Diagnoses:  1. Unilateral primary osteoarthritis, right knee   2. Plantar fasciitis of right foot     Large Joint Inj: R knee on 06/04/2023 3:45 PM Indications: pain Details: 22 G 1.5 in needle, anterolateral  approach  Arthrogram: No  Medications: 3 mL lidocaine 1 %; 40 mg methylPREDNISolone acetate 40 MG/ML Outcome: tolerated well, no immediate complications Procedure, treatment alternatives, risks and benefits explained, specific risks discussed. Consent was given by the patient. Immediately prior to procedure a time out was called to verify the correct patient, procedure, equipment, support staff and site/side marked as required. Patient was prepped and draped in the usual sterile fashion.    Trigger Point Inj  Date/Time: 06/04/2023 3:45 PM  Performed by: Kirtland Bouchard, PA-C Authorized by: Kirtland Bouchard, PA-C   Consent Given by:  Patient Site marked: the procedure site was marked   Timeout: prior to procedure the correct patient, procedure, and site was verified   Indications:  Pain and therapeutic Total # of Trigger Points:  1 Location: lower extremity   Needle Size:  25 G Approach:  Medial Medications #1:  1 mL lidocaine 1 %; 40 mg methylPREDNISolone acetate 40 MG/ML

## 2023-10-07 DIAGNOSIS — H6693 Otitis media, unspecified, bilateral: Secondary | ICD-10-CM | POA: Diagnosis not present

## 2023-10-07 DIAGNOSIS — N390 Urinary tract infection, site not specified: Secondary | ICD-10-CM | POA: Diagnosis not present

## 2023-10-09 DIAGNOSIS — Z0001 Encounter for general adult medical examination with abnormal findings: Secondary | ICD-10-CM | POA: Diagnosis not present

## 2023-10-09 DIAGNOSIS — M5412 Radiculopathy, cervical region: Secondary | ICD-10-CM | POA: Diagnosis not present

## 2023-10-09 DIAGNOSIS — M12811 Other specific arthropathies, not elsewhere classified, right shoulder: Secondary | ICD-10-CM | POA: Diagnosis not present

## 2023-10-09 DIAGNOSIS — E782 Mixed hyperlipidemia: Secondary | ICD-10-CM | POA: Diagnosis not present

## 2023-10-09 DIAGNOSIS — I10 Essential (primary) hypertension: Secondary | ICD-10-CM | POA: Diagnosis not present

## 2023-10-10 DIAGNOSIS — Z0001 Encounter for general adult medical examination with abnormal findings: Secondary | ICD-10-CM | POA: Diagnosis not present

## 2023-10-10 DIAGNOSIS — E559 Vitamin D deficiency, unspecified: Secondary | ICD-10-CM | POA: Diagnosis not present

## 2023-10-10 DIAGNOSIS — E782 Mixed hyperlipidemia: Secondary | ICD-10-CM | POA: Diagnosis not present

## 2023-10-10 DIAGNOSIS — D518 Other vitamin B12 deficiency anemias: Secondary | ICD-10-CM | POA: Diagnosis not present

## 2023-10-10 DIAGNOSIS — G9332 Myalgic encephalomyelitis/chronic fatigue syndrome: Secondary | ICD-10-CM | POA: Diagnosis not present

## 2023-11-22 DIAGNOSIS — M545 Low back pain, unspecified: Secondary | ICD-10-CM | POA: Diagnosis not present

## 2023-11-22 DIAGNOSIS — M25511 Pain in right shoulder: Secondary | ICD-10-CM | POA: Diagnosis not present

## 2023-12-12 DIAGNOSIS — M545 Low back pain, unspecified: Secondary | ICD-10-CM | POA: Diagnosis not present

## 2023-12-12 DIAGNOSIS — M25511 Pain in right shoulder: Secondary | ICD-10-CM | POA: Diagnosis not present

## 2024-02-20 DIAGNOSIS — I10 Essential (primary) hypertension: Secondary | ICD-10-CM | POA: Diagnosis not present

## 2024-02-20 DIAGNOSIS — M1991 Primary osteoarthritis, unspecified site: Secondary | ICD-10-CM | POA: Diagnosis not present

## 2024-02-20 DIAGNOSIS — M67911 Unspecified disorder of synovium and tendon, right shoulder: Secondary | ICD-10-CM | POA: Diagnosis not present

## 2024-02-20 DIAGNOSIS — D179 Benign lipomatous neoplasm, unspecified: Secondary | ICD-10-CM | POA: Diagnosis not present

## 2024-03-10 ENCOUNTER — Other Ambulatory Visit: Payer: Self-pay | Admitting: *Deleted

## 2024-03-10 DIAGNOSIS — R2232 Localized swelling, mass and lump, left upper limb: Secondary | ICD-10-CM

## 2024-03-11 ENCOUNTER — Encounter: Payer: Self-pay | Admitting: General Surgery

## 2024-03-11 ENCOUNTER — Ambulatory Visit: Admitting: General Surgery

## 2024-03-11 ENCOUNTER — Other Ambulatory Visit: Payer: Self-pay

## 2024-03-11 VITALS — BP 100/53 | HR 60 | Temp 97.9°F | Resp 18 | Ht 74.0 in | Wt 272.0 lb

## 2024-03-11 DIAGNOSIS — S40022A Contusion of left upper arm, initial encounter: Secondary | ICD-10-CM

## 2024-03-11 NOTE — Progress Notes (Signed)
 NOELLE HOOGLAND; [id number]; 04/23/1946   HPI Patient is a 78 year old white male who was referred to my care by Dr. Bertell for evaluation and treatment of a subcutaneous mass on his left forearm.  Patient states has been present for several months.  He did have some clear yellow drainage from that in the past.  He states there has been no recent change in size.  He denies any trauma to the left forearm. He also wanted to discuss whether or not he needed a follow-up colonoscopy.  He had a colonoscopy with polypectomy in 2022.  Final pathology revealed a tubular adenoma.  He has not had any GI problems since that time. Past Medical History:  Diagnosis Date   Anxiety    Arthritis    BPH (benign prostatic hyperplasia)    Elevated troponin 01/30/2018   a. 01/2018: Type 2 NSTEMI most consistent with demand ischemia in the setting of Urosepsis.    Hypercholesteremia    Hypertension     Past Surgical History:  Procedure Laterality Date   BACK SURGERY     lumbar disc   CATARACT EXTRACTION W/PHACO Right 02/05/2017   Procedure: CATARACT EXTRACTION PHACO AND INTRAOCULAR LENS PLACEMENT (IOC);  Surgeon: Perley Hamilton, MD;  Location: [facility];  Service: Ophthalmology;  Laterality: Right;  CDE: 7.36   CATARACT EXTRACTION W/PHACO Left 02/19/2017   Procedure: CATARACT EXTRACTION PHACO AND INTRAOCULAR LENS PLACEMENT LEFT EYE;  Surgeon: Perley Hamilton, MD;  Location: [facility];  Service: Ophthalmology;  Laterality: Left;  CDE: 7.98   COLONOSCOPY     COLONOSCOPY N/A 03/19/2018   Procedure: COLONOSCOPY;  Surgeon: Mavis Anes, MD;  Location: [facility];  Service: Gastroenterology;  Laterality: N/A;   COLONOSCOPY N/A 03/26/2018   Procedure: COLONOSCOPY;  Surgeon: Mavis Anes, MD;  Location: [facility];  Service: Gastroenterology;  Laterality: N/A;   COLONOSCOPY N/A 05/17/2021   Procedure: COLONOSCOPY;  Surgeon: Mavis Anes, MD;  [facility]: [facility];  Service: Gastroenterology;  Laterality: N/A;   FOOT  ARTHRODESIS Right 09/06/2022   Procedure: RIGHT TIBIOCALCANEAL FUSION;  Surgeon: Harden Jerona GAILS, MD;  [facility];  Service: Orthopedics;  Laterality: Right;   INGUINAL HERNIA REPAIR Right 12/14/2017   Procedure: HERNIA REPAIR INGUINAL ADULT WITH MESH;  Surgeon: Mavis Anes, MD;  Location: [facility];  Service: General;  Laterality: Right;   JOINT REPLACEMENT Left    POLYPECTOMY  03/19/2018   Procedure: POLYPECTOMY;  Surgeon: Mavis Anes, MD;  Location: [facility];  Service: Gastroenterology;;   POLYPECTOMY  05/17/2021   Procedure: POLYPECTOMY;  Surgeon: Mavis Anes, MD;  [facility]: [facility];  Service: Gastroenterology;;   UMBILICAL HERNIA REPAIR N/A 12/14/2017   Procedure: HERNIA REPAIR UMBILICAL ADULT WITH MESH;  Surgeon: Mavis Anes, MD;  Location: [facility];  Service: General;  Laterality: N/A;    Family History  Problem Relation Age of Onset   Hypertension Mother    Cancer Father    Colon cancer Neg Hx     Current Outpatient Medications on File Prior to Visit  Medication Sig Dispense Refill   acetaminophen  (TYLENOL ) 500 MG tablet Take 1,000 mg by mouth every 6 (six) hours as needed for mild pain.     atorvastatin  (LIPITOR) 40 MG tablet Take 40 mg by mouth every morning.      b complex vitamins tablet Take 1 tablet by mouth daily.     citalopram  (CELEXA ) 40 MG tablet Take 40 mg by mouth daily.     Coenzyme Q10 (  COQ10) 100 MG CAPS Take 100 mg by mouth daily.     doxazosin  (CARDURA ) 4 MG tablet Take 4 mg by mouth at bedtime.     ezetimibe  (ZETIA ) 10 MG tablet Take 10 mg by mouth daily.     lisinopril  (PRINIVIL ,ZESTRIL ) 20 MG tablet Take 20 mg by mouth daily.     Multiple Vitamins-Minerals (CENTRUM SILVER ADULT 50+ PO) Take 1 tablet by mouth daily.     Omega-3 Fatty Acids (OMEGA-3 FISH OIL PO) Take 1 capsule by mouth daily.     oxyCODONE -acetaminophen  (PERCOCET/ROXICET) 5-325 MG tablet Take 1 tablet by mouth every 4 (four) hours as needed. 30 tablet 0   tamsulosin (FLOMAX)  0.4 MG CAPS capsule Take 0.4 mg by mouth at bedtime.     vitamin C (ASCORBIC ACID) 500 MG tablet Take 500 mg by mouth daily.     No current facility-administered medications on file prior to visit.    Allergies  Allergen Reactions   Shrimp [Shellfish Allergy] Anaphylaxis and Rash    Social History   Substance and Sexual Activity  Alcohol Use Yes   Alcohol/week: 21.0 standard drinks of alcohol   Types: 21 Cans of beer per week   Comment: states drinks beer about every day    Social History   Tobacco Use  Smoking Status Former   Current packs/day: 0.00   Average packs/day: 1 pack/day for 20.0 years (20.0 ttl pk-yrs)   Types: Cigarettes   Start date: 01/29/1981   Quit date: 01/29/2001   Years since quitting: 23.1  Smokeless Tobacco Never    Review of Systems  Constitutional: Negative.   HENT: Negative.    Eyes: Negative.   Respiratory: Negative.    Cardiovascular: Negative.   Gastrointestinal: Negative.   Genitourinary: Negative.   Musculoskeletal:  Positive for back pain and neck pain.  Skin: Negative.   Neurological: Negative.   Endo/Heme/Allergies: Negative.   Psychiatric/Behavioral: Negative.      Objective   Vitals:   03/11/24 1318  BP: (!) 100/53  Pulse: 60  Resp: 18  Temp: 97.9 F (36.6 C)  SpO2: 93%    Physical Exam Vitals reviewed.  Constitutional:      Appearance: Normal appearance. He is not ill-appearing.  HENT:     Head: Normocephalic and atraumatic.  Cardiovascular:     Rate and Rhythm: Normal rate and regular rhythm.     Heart sounds: Normal heart sounds. No murmur heard.    No friction rub. No gallop.  Pulmonary:     Effort: Pulmonary effort is normal. No respiratory distress.     Breath sounds: Normal breath sounds. No stridor. No wheezing, rhonchi or rales.  Abdominal:     General: There is no distension.     Palpations: Abdomen is soft. There is no mass.     Tenderness: There is no abdominal tenderness. There is no guarding or  rebound.     Hernia: No hernia is present.  Skin:    General: Skin is warm and dry.     Comments: They raised soft subcutaneous 1-1/2 cm mass was noted on the dorsal aspect of the left forearm.  Patient consented verbally to incision and drainage of this subcutaneous mass.  The left forearm was prepped with alcohol.  Biofreeze was used to numb the skin.  An incision was made and a large amount of clot was expressed.  There also appeared to be a very small hard cystic component that I could not excise.  This appeared  to be an old inclusion cyst.  Triple antibiotic ointment was applied.  A dry sterile bandage was applied.  Neurological:     Mental Status: He is alert and oriented to person, place, and time.     Assessment  Hematoma of left forearm, possible old inclusion cyst Plan  I will see the patient in 3 weeks for follow-up.  He should keep the wound clean and dry with soap and water .  In terms of his colonoscopy, I told him that I would not repeat the colonoscopy for another 3 years given that there was no dysplasia on the polyp.  He understands and agrees.

## 2024-04-01 ENCOUNTER — Encounter: Payer: Self-pay | Admitting: General Surgery

## 2024-04-01 ENCOUNTER — Ambulatory Visit (INDEPENDENT_AMBULATORY_CARE_PROVIDER_SITE_OTHER): Admitting: General Surgery

## 2024-04-01 VITALS — BP 122/72 | HR 67 | Temp 97.5°F | Resp 18 | Ht 74.0 in | Wt 268.0 lb

## 2024-04-01 DIAGNOSIS — Z09 Encounter for follow-up examination after completed treatment for conditions other than malignant neoplasm: Secondary | ICD-10-CM

## 2024-04-01 NOTE — Progress Notes (Signed)
 Subjective:     Jerry Boyer  Here for wound check.  Wound is healing well by secondary intention.  It occasionally bleeds when he rubs it. Objective:    BP 122/72   Pulse 67   Temp (!) 97.5 F (36.4 C) (Oral)   Resp 18   Ht 6' 2 (1.88 m)   Wt 268 lb (121.6 kg)   SpO2 94%   BMI 34.41 kg/m   General:  alert, cooperative, and no distress  Left forearm wound healing well by secondary intention.     Assessment:    Doing well postoperatively.    Plan:   I told him to give it a couple weeks to heal.  Should there be a question or concerns, he was instructed to return to my care.  Follow-up here as needed.

## 2024-06-03 ENCOUNTER — Ambulatory Visit: Admitting: Orthopedic Surgery

## 2024-06-03 ENCOUNTER — Encounter: Payer: Self-pay | Admitting: Orthopedic Surgery

## 2024-06-03 VITALS — BP 151/62 | HR 52 | Ht 74.0 in | Wt 268.0 lb

## 2024-06-03 DIAGNOSIS — M1711 Unilateral primary osteoarthritis, right knee: Secondary | ICD-10-CM | POA: Diagnosis not present

## 2024-06-03 NOTE — Patient Instructions (Signed)

## 2024-06-03 NOTE — Progress Notes (Signed)
 New Patient Visit  Assessment: Jerry Boyer is a 78 y.o. male with the following: 1. Unilateral primary osteoarthritis, right knee  Plan: TRISTEN LUCE has advanced degenerative changes in the right knee.  Jerry Boyer has previously had injections, these have been successful.  Jerry Boyer would like to proceed with an injection today.  This was completed without difficulty.  Jerry Boyer will follow-up as needed.  If Jerry Boyer is interested in physical therapy, Jerry Boyer will also contact the clinic.  Otherwise, I recommended Jerry Boyer use a cane to assist with ambulation, and help with his balance.  Procedure note injection Right knee joint   Verbal consent was obtained to inject the right knee joint  Timeout was completed to confirm the site of injection.  The skin was prepped with alcohol and ethyl chloride was sprayed at the injection site.  A 21-gauge needle was used to inject 40 mg of Depo-Medrol  and 1% lidocaine  (4 cc) into the right knee using an anterolateral approach.  There were no complications. A sterile bandage was applied.   Follow-up: Return if symptoms worsen or fail to improve.  Subjective:  Chief Complaint  Patient presents with   Knee Pain    Long duration of pain/ Right knee/ wants injection     History of Present Illness: Jerry WILTROUT is a 78 y.o. male who presents for evaluation of right knee pain.  Jerry Boyer has progressively worsening right knee pain.  This been ongoing for several months.  Does have a history of left total knee arthroplasty, completed by Dr. Anderson at least 4 years ago.  Jerry Boyer has done well.  Jerry Boyer is not ready to proceed with right knee arthroplasty.  Jerry Boyer has been followed in [place], and has had multiple injections.  Jerry Boyer has noticed diminishing returns from the injections, but notes that they have improved his symptoms.  Jerry Boyer would like to proceed with another injection today.  Review of Systems: No fevers or chills No numbness or tingling No chest pain No shortness of breath No bowel or  bladder dysfunction No GI distress No headaches   Medical History:  Past Medical History:  Diagnosis Date   Anxiety    Arthritis    BPH (benign prostatic hyperplasia)    Elevated troponin 01/30/2018   a. 01/2018: Type 2 NSTEMI most consistent with demand ischemia in the setting of Urosepsis.    Hypercholesteremia    Hypertension     Past Surgical History:  Procedure Laterality Date   BACK SURGERY     lumbar disc   CATARACT EXTRACTION W/PHACO Right 02/05/2017   Procedure: CATARACT EXTRACTION PHACO AND INTRAOCULAR LENS PLACEMENT (IOC);  Surgeon: Perley Hamilton, MD;  Location: [facility];  Service: Ophthalmology;  Laterality: Right;  CDE: 7.36   CATARACT EXTRACTION W/PHACO Left 02/19/2017   Procedure: CATARACT EXTRACTION PHACO AND INTRAOCULAR LENS PLACEMENT LEFT EYE;  Surgeon: Perley Hamilton, MD;  Location: [facility];  Service: Ophthalmology;  Laterality: Left;  CDE: 7.98   COLONOSCOPY     COLONOSCOPY N/A 03/19/2018   Procedure: COLONOSCOPY;  Surgeon: Mavis Anes, MD;  [facility];  Service: Gastroenterology;  Laterality: N/A;   COLONOSCOPY N/A 03/26/2018   Procedure: COLONOSCOPY;  Surgeon: Mavis Anes, MD;  [facility];  Service: Gastroenterology;  Laterality: N/A;   COLONOSCOPY N/A 05/17/2021   Procedure: COLONOSCOPY;  Surgeon: Mavis Anes, MD;  [facility]: [facility];  Service: Gastroenterology;  Laterality: N/A;   FOOT ARTHRODESIS Right 09/06/2022   Procedure: RIGHT TIBIOCALCANEAL FUSION;  Surgeon:  Harden Jerona GAILS, MD;  Location: [facility] OR;  Service: Orthopedics;  Laterality: Right;   INGUINAL HERNIA REPAIR Right 12/14/2017   Procedure: HERNIA REPAIR INGUINAL ADULT WITH MESH;  Surgeon: Mavis Anes, MD;  Location: [facility];  Service: General;  Laterality: Right;   JOINT REPLACEMENT Left    POLYPECTOMY  03/19/2018   Procedure: POLYPECTOMY;  Surgeon: Mavis Anes, MD;  [facility];  Service: Gastroenterology;;   POLYPECTOMY  05/17/2021   Procedure: POLYPECTOMY;   Surgeon: Mavis Anes, MD;  [facility];  Service: Gastroenterology;;   UMBILICAL HERNIA REPAIR N/A 12/14/2017   Procedure: HERNIA REPAIR UMBILICAL ADULT WITH MESH;  Surgeon: Mavis Anes, MD;  Location: [facility];  Service: General;  Laterality: N/A;    Family History  Problem Relation Age of Onset   Hypertension Mother    Cancer Father    Colon cancer Neg Hx    Social History   Tobacco Use   Smoking status: Former    Current packs/day: 0.00    Average packs/day: 1 pack/day for 20.0 years (20.0 ttl pk-yrs)    Types: Cigarettes    Start date: 01/29/1981    Quit date: 01/29/2001    Years since quitting: 23.3   Smokeless tobacco: Never  Vaping Use   Vaping status: Never Used  Substance Use Topics   Alcohol use: Yes    Alcohol/week: 21.0 standard drinks of alcohol    Types: 21 Cans of beer per week    Comment: states drinks beer about every day   Drug use: No    Allergies  Allergen Reactions   Shrimp [Shellfish Allergy] Anaphylaxis and Rash    Current Meds  Medication Sig   acetaminophen  (TYLENOL ) 500 MG tablet Take 1,000 mg by mouth every 6 (six) hours as needed for mild pain.   atorvastatin  (LIPITOR) 40 MG tablet Take 40 mg by mouth every morning.    b complex vitamins tablet Take 1 tablet by mouth daily.   citalopram  (CELEXA ) 40 MG tablet Take 40 mg by mouth daily.   Coenzyme Q10 (COQ10) 100 MG CAPS Take 100 mg by mouth daily.   doxazosin  (CARDURA ) 4 MG tablet Take 4 mg by mouth at bedtime.   ezetimibe  (ZETIA ) 10 MG tablet Take 10 mg by mouth daily.   lisinopril  (PRINIVIL ,ZESTRIL ) 20 MG tablet Take 20 mg by mouth daily.   Multiple Vitamins-Minerals (CENTRUM SILVER ADULT 50+ PO) Take 1 tablet by mouth daily.   Omega-3 Fatty Acids (OMEGA-3 FISH OIL PO) Take 1 capsule by mouth daily.   oxyCODONE -acetaminophen  (PERCOCET/ROXICET) 5-325 MG tablet Take 1 tablet by mouth every 4 (four) hours as needed.   tamsulosin (FLOMAX) 0.4 MG CAPS capsule Take 0.4 mg by mouth  at bedtime.   vitamin C (ASCORBIC ACID) 500 MG tablet Take 500 mg by mouth daily.    Objective: BP (!) 151/62   Pulse (!) 52   Ht 6' 2 (1.88 m)   Wt 268 lb (121.6 kg)   BMI 34.41 kg/m   Physical Exam:  General: Alert and oriented. and No acute distress. Gait: Right sided antalgic gait.  Right knee without deformity.  Tenderness to palpation along the medial joint line.  Range of motion from 5-120 degrees.  Negative Lachman.  No increased laxity varus or valgus stress.  Well-healed anterior ankle incision, without plantarflexion or dorsiflexion.  IMAGING: I personally reviewed images previously obtained in clinic  X-rays of the right knee were previously obtained.  Advanced degenerative changes, with complete  loss of joint space within the medial compartment.    New Medications:  No orders of the defined types were placed in this encounter.     Oneil DELENA Horde, MD  06/03/2024 1:32 PM

## 2024-08-26 ENCOUNTER — Encounter: Payer: Self-pay | Admitting: General Surgery

## 2024-08-26 ENCOUNTER — Ambulatory Visit: Admitting: General Surgery

## 2024-08-26 VITALS — BP 126/76 | HR 57 | Temp 98.2°F | Resp 18 | Ht 74.0 in | Wt 279.0 lb

## 2024-08-26 DIAGNOSIS — R2232 Localized swelling, mass and lump, left upper limb: Secondary | ICD-10-CM

## 2024-08-26 NOTE — Progress Notes (Signed)
 Jerry Boyer; [id number]; [birth year]   HPI Patient is a 79 year old white male who presented back to my care for evaluation and treatment of a recurrent left forearm mass as well as a skin tag behind his right ear.  He states he has had this left forearm mass in the past but I&D resolved.  He states recently he has become larger in size.  No drainage has been noted. Past Medical History:  Diagnosis Date   Anxiety    Arthritis    BPH (benign prostatic hyperplasia)    Elevated troponin 01/30/2018   a. 01/2018: Type 2 NSTEMI most consistent with demand ischemia in the setting of Urosepsis.    Hypercholesteremia    Hypertension     Past Surgical History:  Procedure Laterality Date   BACK SURGERY     lumbar disc   CATARACT EXTRACTION W/PHACO Right 02/05/2017   Procedure: CATARACT EXTRACTION PHACO AND INTRAOCULAR LENS PLACEMENT (IOC);  Surgeon: Perley Hamilton, MD;  Location: [facility];  Service: Ophthalmology;  Laterality: Right;  CDE: 7.36   CATARACT EXTRACTION W/PHACO Left 02/19/2017   Procedure: CATARACT EXTRACTION PHACO AND INTRAOCULAR LENS PLACEMENT LEFT EYE;  Surgeon: Perley Hamilton, MD;  Location: [facility];  Service: Ophthalmology;  Laterality: Left;  CDE: 7.98   COLONOSCOPY     COLONOSCOPY N/A 03/19/2018   Procedure: COLONOSCOPY;  Surgeon: Mavis Anes, MD;  Location: [facility];  Service: Gastroenterology;  Laterality: N/A;   COLONOSCOPY N/A 03/26/2018   Procedure: COLONOSCOPY;  Surgeon: Mavis Anes, MD;  Location: [facility];  Service: Gastroenterology;  Laterality: N/A;   COLONOSCOPY N/A 05/17/2021   Procedure: COLONOSCOPY;  Surgeon: Mavis Anes, MD;  Location: [facility];  Service: Gastroenterology;  Laterality: N/A;   FOOT ARTHRODESIS Right 09/06/2022   Procedure: RIGHT TIBIOCALCANEAL FUSION;  Surgeon: Harden Jerona GAILS, MD;  Location: [facility];  Service: Orthopedics;  Laterality: Right;   INGUINAL HERNIA REPAIR Right 12/14/2017   Procedure: HERNIA REPAIR INGUINAL ADULT WITH MESH;   Surgeon: Mavis Anes, MD;  Location: [facility];  Service: General;  Laterality: Right;   JOINT REPLACEMENT Left    POLYPECTOMY  03/19/2018   Procedure: POLYPECTOMY;  Surgeon: Mavis Anes, MD;  Location: [facility];  Service: Gastroenterology;;   POLYPECTOMY  05/17/2021   Procedure: POLYPECTOMY;  Surgeon: Mavis Anes, MD;  Location: [facility];  Service: Gastroenterology;;   UMBILICAL HERNIA REPAIR N/A 12/14/2017   Procedure: HERNIA REPAIR UMBILICAL ADULT WITH MESH;  Surgeon: Mavis Anes, MD;  Location: [facility];  Service: General;  Laterality: N/A;    Family History  Problem Relation Age of Onset   Hypertension Mother    Cancer Father    Colon cancer Neg Hx     Medications Ordered Prior to Encounter[1]  Allergies[2]  Social History   Substance and Sexual Activity  Alcohol Use Yes   Alcohol/week: 21.0 standard drinks of alcohol   Types: 21 Cans of beer per week   Comment: states drinks beer about every day    Tobacco Use History[3]  Review of Systems  Constitutional: Negative.   HENT: Negative.    Eyes: Negative.   Respiratory: Negative.    Cardiovascular: Negative.   Gastrointestinal: Negative.   Genitourinary: Negative.   Musculoskeletal: Negative.   Skin: Negative.   Neurological: Negative.   Endo/Heme/Allergies: Negative.   Psychiatric/Behavioral: Negative.      Objective   Vitals:   08/26/24 1120  BP: 126/76  Pulse: (!) 57  Resp: 18  Temp: 98.2  F (36.8 C)  SpO2: 94%    Physical Exam Vitals reviewed.  Constitutional:      Appearance: Normal appearance. He is not ill-appearing.  HENT:     Head: Normocephalic and atraumatic.     Comments: A skin tag was noted behind the right ear.  The area was prepped with Betadine .  1% Xylocaine  with epinephrine  was used for local.  The skin tag was excised using Bovie electrocautery without difficulty.  The tag was disposed of.  Patient tolerated the procedure well. Cardiovascular:     Rate and Rhythm:  Normal rate and regular rhythm.     Heart sounds: Normal heart sounds. No murmur heard.    No friction rub. No gallop.  Pulmonary:     Effort: Pulmonary effort is normal. No respiratory distress.     Breath sounds: Normal breath sounds. No wheezing.  Skin:    General: Skin is warm and dry.     Comments: Left forearm on the dorsal surface with a 1.5 cm raised area with fluctuance.  Some thickened skin is noted along the proximal aspect.  The area was prepped with Betadine  and 1% Xylocaine  with epinephrine  was used for local.  An incision was made and serosanguineous fluid was expressed.  There was still an area of thickened skin proximally.  Patient tolerated the procedure well.  Dermabond was applied to the incision site.  Neurological:     Mental Status: He is alert and oriented to person, place, and time.     Assessment  Left forearm skin mass with serosanguineous sac Skin tag, posterior auricular on the right, benign Plan  Patient will follow-up here in 2 weeks to reevaluate the left forearm skin lesion.  Further management is pending that evaluation.    [1]  Current Outpatient Medications on File Prior to Visit  Medication Sig Dispense Refill   acetaminophen  (TYLENOL ) 500 MG tablet Take 1,000 mg by mouth every 6 (six) hours as needed for mild pain.     atorvastatin  (LIPITOR) 40 MG tablet Take 40 mg by mouth every morning.      b complex vitamins tablet Take 1 tablet by mouth daily.     citalopram  (CELEXA ) 40 MG tablet Take 40 mg by mouth daily.     Coenzyme Q10 (COQ10) 100 MG CAPS Take 100 mg by mouth daily.     doxazosin  (CARDURA ) 4 MG tablet Take 4 mg by mouth at bedtime.     ezetimibe  (ZETIA ) 10 MG tablet Take 10 mg by mouth daily.     lisinopril  (PRINIVIL ,ZESTRIL ) 20 MG tablet Take 20 mg by mouth daily.     Multiple Vitamins-Minerals (CENTRUM SILVER ADULT 50+ PO) Take 1 tablet by mouth daily.     Omega-3 Fatty Acids (OMEGA-3 FISH OIL PO) Take 1 capsule by mouth daily.      oxyCODONE -acetaminophen  (PERCOCET/ROXICET) 5-325 MG tablet Take 1 tablet by mouth every 4 (four) hours as needed. 30 tablet 0   tamsulosin (FLOMAX) 0.4 MG CAPS capsule Take 0.4 mg by mouth at bedtime.     vitamin C (ASCORBIC ACID) 500 MG tablet Take 500 mg by mouth daily.     No current facility-administered medications on file prior to visit.  [2]  Allergies Allergen Reactions   Shrimp [Shellfish Allergy] Anaphylaxis and Rash  [3]  Social History Tobacco Use  Smoking Status Former   Current packs/day: 0.00   Average packs/day: 1 pack/day for 20.0 years (20.0 ttl pk-yrs)   Types: Cigarettes   Start  date: 01/29/1981   Quit date: 01/29/2001   Years since quitting: 23.5  Smokeless Tobacco Never

## 2024-09-09 ENCOUNTER — Ambulatory Visit: Admitting: General Surgery
